# Patient Record
Sex: Female | Born: 1965
Health system: Southern US, Community
[De-identification: ages and names within clinical notes are randomized; demographics above are authoritative.]

## PROBLEM LIST (undated history)

## (undated) DIAGNOSIS — T884XXA Failed or difficult intubation, initial encounter: Secondary | ICD-10-CM

## (undated) DIAGNOSIS — G709 Myoneural disorder, unspecified: Secondary | ICD-10-CM

## (undated) DIAGNOSIS — E079 Disorder of thyroid, unspecified: Secondary | ICD-10-CM

## (undated) DIAGNOSIS — E039 Hypothyroidism, unspecified: Secondary | ICD-10-CM

## (undated) DIAGNOSIS — E119 Type 2 diabetes mellitus without complications: Secondary | ICD-10-CM

## (undated) DIAGNOSIS — Z8489 Family history of other specified conditions: Secondary | ICD-10-CM

## (undated) DIAGNOSIS — D251 Intramural leiomyoma of uterus: Secondary | ICD-10-CM

## (undated) DIAGNOSIS — I509 Heart failure, unspecified: Secondary | ICD-10-CM

## (undated) DIAGNOSIS — I119 Hypertensive heart disease without heart failure: Secondary | ICD-10-CM

## (undated) DIAGNOSIS — G4733 Obstructive sleep apnea (adult) (pediatric): Secondary | ICD-10-CM

## (undated) DIAGNOSIS — I10 Essential (primary) hypertension: Secondary | ICD-10-CM

## (undated) DIAGNOSIS — G4730 Sleep apnea, unspecified: Secondary | ICD-10-CM

## (undated) HISTORY — DX: Obstructive sleep apnea (adult) (pediatric): G47.33

## (undated) HISTORY — DX: Disorder of thyroid, unspecified: E07.9

## (undated) HISTORY — PX: ABLATION: SHX5711

## (undated) HISTORY — PX: TRANSTHORACIC ECHOCARDIOGRAM: SHX275

## (undated) HISTORY — DX: Myoneural disorder, unspecified: G70.9

## (undated) HISTORY — PX: TUBAL LIGATION: SHX77

## (undated) HISTORY — PX: DIAGNOSTIC LAPAROSCOPY: SUR761

## (undated) HISTORY — DX: Sleep apnea, unspecified: G47.30

## (undated) HISTORY — DX: Hypertensive heart disease without heart failure: I11.9

## (undated) HISTORY — PX: COLONOSCOPY: SHX174

## (undated) HISTORY — DX: Intramural leiomyoma of uterus: D25.1

## (undated) HISTORY — PX: THYROIDECTOMY: SHX17

## (undated) HISTORY — PX: DILATION AND CURETTAGE OF UTERUS: SHX78

---

## 2011-04-28 DIAGNOSIS — I420 Dilated cardiomyopathy: Secondary | ICD-10-CM

## 2011-04-28 DIAGNOSIS — Z87891 Personal history of nicotine dependence: Secondary | ICD-10-CM

## 2011-04-28 HISTORY — DX: Dilated cardiomyopathy: I42.0

## 2011-04-28 HISTORY — DX: Personal history of nicotine dependence: Z87.891

## 2011-05-19 HISTORY — PX: LEFT HEART CATH AND CORONARY ANGIOGRAPHY: CATH118249

## 2011-07-18 HISTORY — PX: TRANSTHORACIC ECHOCARDIOGRAM: SHX275

## 2015-12-23 ENCOUNTER — Encounter: Payer: Self-pay | Admitting: Gastroenterology

## 2017-01-21 DIAGNOSIS — F17200 Nicotine dependence, unspecified, uncomplicated: Secondary | ICD-10-CM | POA: Insufficient documentation

## 2017-01-21 DIAGNOSIS — I509 Heart failure, unspecified: Secondary | ICD-10-CM | POA: Insufficient documentation

## 2017-01-21 DIAGNOSIS — E890 Postprocedural hypothyroidism: Secondary | ICD-10-CM | POA: Insufficient documentation

## 2017-01-21 DIAGNOSIS — I110 Hypertensive heart disease with heart failure: Secondary | ICD-10-CM | POA: Insufficient documentation

## 2017-01-21 DIAGNOSIS — Z87891 Personal history of nicotine dependence: Secondary | ICD-10-CM | POA: Insufficient documentation

## 2017-01-21 DIAGNOSIS — Z794 Long term (current) use of insulin: Secondary | ICD-10-CM | POA: Insufficient documentation

## 2017-01-21 DIAGNOSIS — E119 Type 2 diabetes mellitus without complications: Secondary | ICD-10-CM | POA: Insufficient documentation

## 2017-12-04 ENCOUNTER — Emergency Department (HOSPITAL_BASED_OUTPATIENT_CLINIC_OR_DEPARTMENT_OTHER)
Admission: EM | Admit: 2017-12-04 | Discharge: 2017-12-04 | Disposition: A | Discharge status: Home / Self Care | Payer: Self-pay | Attending: Emergency Medicine | Admitting: Emergency Medicine

## 2017-12-04 ENCOUNTER — Emergency Department (HOSPITAL_BASED_OUTPATIENT_CLINIC_OR_DEPARTMENT_OTHER): Payer: Self-pay

## 2017-12-04 ENCOUNTER — Other Ambulatory Visit: Payer: Self-pay

## 2017-12-04 ENCOUNTER — Encounter (HOSPITAL_BASED_OUTPATIENT_CLINIC_OR_DEPARTMENT_OTHER): Payer: Self-pay | Admitting: Emergency Medicine

## 2017-12-04 DIAGNOSIS — Z7984 Long term (current) use of oral hypoglycemic drugs: Secondary | ICD-10-CM | POA: Insufficient documentation

## 2017-12-04 DIAGNOSIS — E119 Type 2 diabetes mellitus without complications: Secondary | ICD-10-CM | POA: Insufficient documentation

## 2017-12-04 DIAGNOSIS — I509 Heart failure, unspecified: Secondary | ICD-10-CM | POA: Insufficient documentation

## 2017-12-04 DIAGNOSIS — A599 Trichomoniasis, unspecified: Secondary | ICD-10-CM | POA: Insufficient documentation

## 2017-12-04 DIAGNOSIS — Z79899 Other long term (current) drug therapy: Secondary | ICD-10-CM | POA: Insufficient documentation

## 2017-12-04 DIAGNOSIS — I110 Hypertensive heart disease with heart failure: Secondary | ICD-10-CM | POA: Insufficient documentation

## 2017-12-04 DIAGNOSIS — F17200 Nicotine dependence, unspecified, uncomplicated: Secondary | ICD-10-CM | POA: Insufficient documentation

## 2017-12-04 DIAGNOSIS — D219 Benign neoplasm of connective and other soft tissue, unspecified: Secondary | ICD-10-CM

## 2017-12-04 DIAGNOSIS — D259 Leiomyoma of uterus, unspecified: Secondary | ICD-10-CM | POA: Insufficient documentation

## 2017-12-04 HISTORY — DX: Essential (primary) hypertension: I10

## 2017-12-04 HISTORY — DX: Heart failure, unspecified: I50.9

## 2017-12-04 HISTORY — DX: Type 2 diabetes mellitus without complications: E11.9

## 2017-12-04 LAB — LIPASE, BLOOD: LIPASE: 32 U/L (ref 11–51)

## 2017-12-04 LAB — BASIC METABOLIC PANEL
Anion gap: 9 (ref 5–15)
BUN: 14 mg/dL (ref 6–20)
CALCIUM: 9.1 mg/dL (ref 8.9–10.3)
CO2: 27 mmol/L (ref 22–32)
CREATININE: 1.01 mg/dL — AB (ref 0.44–1.00)
Chloride: 100 mmol/L (ref 98–111)
GFR calc non Af Amer: >60 mL/min (ref >60)
GLUCOSE: 209 mg/dL — AB (ref 70–99)
Potassium: 4.4 mmol/L (ref 3.5–5.1)
Sodium: 136 mmol/L (ref 135–145)

## 2017-12-04 LAB — WET PREP, GENITAL
SPERM: NONE SEEN
YEAST WET PREP: NONE SEEN

## 2017-12-04 LAB — URINALYSIS, ROUTINE W REFLEX MICROSCOPIC
BILIRUBIN URINE: NEGATIVE
Glucose, UA: NEGATIVE mg/dL
Hgb urine dipstick: NEGATIVE
KETONES UR: NEGATIVE mg/dL
NITRITE: NEGATIVE
PROTEIN: NEGATIVE mg/dL
Specific Gravity, Urine: <1.005 — ABNORMAL LOW (ref 1.005–1.030)
pH: 7 (ref 5.0–8.0)

## 2017-12-04 LAB — CBC
HCT: 42 % (ref 36.0–46.0)
Hemoglobin: 14.2 g/dL (ref 12.0–15.0)
MCH: 28.6 pg (ref 26.0–34.0)
MCHC: 33.8 g/dL (ref 30.0–36.0)
MCV: 84.7 fL (ref 78.0–100.0)
PLATELETS: 237 10*3/uL (ref 150–400)
RBC: 4.96 MIL/uL (ref 3.87–5.11)
RDW: 13.8 % (ref 11.5–15.5)
WBC: 6.6 10*3/uL (ref 4.0–10.5)

## 2017-12-04 LAB — HEPATIC FUNCTION PANEL
ALT: 15 U/L (ref 0–44)
AST: 18 U/L (ref 15–41)
Albumin: 4.2 g/dL (ref 3.5–5.0)
Alkaline Phosphatase: 69 U/L (ref 38–126)
BILIRUBIN DIRECT: 0.1 mg/dL (ref 0.0–0.2)
Indirect Bilirubin: 0.6 mg/dL (ref 0.3–0.9)
TOTAL PROTEIN: 8 g/dL (ref 6.5–8.1)
Total Bilirubin: 0.7 mg/dL (ref 0.3–1.2)

## 2017-12-04 LAB — TROPONIN I

## 2017-12-04 LAB — URINALYSIS, MICROSCOPIC (REFLEX)

## 2017-12-04 MED ORDER — IOPAMIDOL (ISOVUE-300) INJECTION 61%
100.0000 mL | Freq: Once | INTRAVENOUS | Status: AC | PRN
  Administered 2017-12-04: 100 mL via INTRAVENOUS

## 2017-12-04 MED ORDER — METRONIDAZOLE 500 MG PO TABS: 500.0000 mg | ORAL_TABLET | Freq: Two times a day (BID) | ORAL | 0 refills | Status: AC

## 2017-12-04 MED ORDER — IOPAMIDOL (ISOVUE-300) INJECTION 61%: 100.0000 mL | Freq: Once | INTRAVENOUS | Status: DC | PRN

## 2017-12-04 MED ORDER — HYDROCODONE-ACETAMINOPHEN 5-325 MG PO TABS: 1.0000 | ORAL_TABLET | Freq: Three times a day (TID) | ORAL | 0 refills | Status: DC | PRN

## 2017-12-04 MED ORDER — ACETAMINOPHEN 500 MG PO TABS
1000.0000 mg | ORAL_TABLET | Freq: Once | ORAL | Status: AC
  Administered 2017-12-04: 1000 mg via ORAL
  Filled 2017-12-04: qty 2

## 2017-12-04 MED ORDER — IOPAMIDOL (ISOVUE-M 300) INJECTION 61%: 15.0000 mL | Freq: Once | INTRAMUSCULAR | Status: DC | PRN

## 2017-12-04 NOTE — ED Notes (Signed)
Patient transported to X-ray 

## 2017-12-04 NOTE — ED Provider Notes (Signed)
[facility] EMERGENCY DEPARTMENT Provider Note   CSN: [id number] Arrival date & time: 12/04/17  1321     History   Chief Complaint Chief Complaint  Patient presents with  . Chest Pain  . Abdominal Pain    HPI Suzanne Cline is a 52 y.o. female.  HPI   Patient is a 52 year old female with history of CHF, T2DM, hypertension, hyperlipidemia, fibroids who presents the emergency department today to be evaluated for chest pain and abdominal pain.  With regard to chest pain, patient states that for the last several years she has had intermittent episodes of left-sided chest/axillary tightness/pain that she rates at 7/10.  The episodes last 5 minutes at a time and "feel like a muscle strain ".  She denies any particular exacerbating or alleviating factors and states that symptoms are not associated with exertion.  Not associated with diaphoresis, nausea, vomiting, lightheadedness or diaphoresis.  States that she has these episodes about 1-2 times per month.  Most recent episode occurred yesterday.  The episode lasted for a few minutes and resolved on its own.  She denies any recurrence of the episodes and has no chest pain right now.  With regard to abdominal pain, she states that she has had left-sided abdominal pain for several weeks that seems to have worsened over the last week.  Pain is located to the left upper and left lower quadrant.  Rates pain "11/10 ".  States that it is intermittent and has been becoming more frequent recently.  Intermittently radiates to her back.  Reports that it feels like "fire in my stomach " and feels like pins-and-needles.  Denies rashes.  She reports associated nausea and diarrhea but denies any vomiting, constipation, bloody stools.  Endorses some urinary frequency but denies dysuria, hematuria, urgency.  Denies any abnormal vaginal discharge.  States that she has not been sexually active in 1 year.  Denies any fevers.  Past Medical History:    Diagnosis Date  . CHF (congestive heart failure) ([place])   . Diabetes mellitus without complication ([place])   . Hypertension     There are no active problems to display for this patient.   Past Surgical History:  Procedure Laterality Date  . ABLATION     uterus  . THYROIDECTOMY       OB History   None      Home Medications    Prior to Admission medications   Medication Sig Start Date End Date Taking? Authorizing Provider  carvedilol (COREG) 25 MG tablet Take 25 mg by mouth 2 (two) times daily with a meal.   Yes [provider]  furosemide (LASIX) 80 MG tablet Take 80 mg by mouth 2 (two) times daily.   Yes [provider]  levothyroxine (SYNTHROID) 125 MCG tablet Take 125 mcg by mouth daily before breakfast.   Yes [provider]  losartan (COZAAR) 25 MG tablet Take 25 mg by mouth daily.   Yes [provider]  metFORMIN (GLUCOPHAGE) 1000 MG tablet Take 1,000 mg by mouth 2 (two) times daily with a meal.   Yes [provider]  spironolactone (ALDACTONE) 25 MG tablet Take 25 mg by mouth daily.   Yes [provider]  HYDROcodone-acetaminophen (NORCO/VICODIN) 5-325 MG tablet Take 1 tablet by mouth every 8 (eight) hours as needed. 12/04/17   Amahri Dengel S, PA-C  metroNIDAZOLE (FLAGYL) 500 MG tablet Take 1 tablet (500 mg total) by mouth 2 (two) times daily for 7 days. 12/04/17 12/11/17  Asianae Minkler S, PA-C    Family History No family history on file.  Social History Social History   Tobacco Use  . Smoking status: Current Every Day Smoker    Packs/day: 0.50  . Smokeless tobacco: Never Used  Substance Use Topics  . Alcohol use: Yes    Comment: social  . Drug use: Never     Allergies   Oxycodone   Review of Systems Review of Systems  Constitutional: Negative for chills and fever.  HENT: Negative for congestion.   Eyes: Negative for visual disturbance.  Respiratory: Negative for shortness of breath.    Cardiovascular: Negative for chest pain and leg swelling.  Gastrointestinal: Positive for abdominal pain, diarrhea and nausea. Negative for blood in stool, constipation and vomiting.  Genitourinary: Positive for flank pain, frequency and pelvic pain. Negative for dysuria, hematuria, urgency and vaginal discharge.  Musculoskeletal: Negative for back pain.  Skin: Negative for color change.  Neurological: Negative for headaches.   Physical Exam Updated Vital Signs BP (!) 169/107 (BP Location: Right Arm)   Pulse 63   Temp 98.6 F (37 C)   Resp 16   Ht 5' 3.5" (1.613 m)   Wt 79.8 kg   SpO2 100%   BMI 30.69 kg/m   Physical Exam  Constitutional: She appears well-developed and well-nourished. She appears distressed (mild).  HENT:  Head: Normocephalic and atraumatic.  Eyes: Conjunctivae are normal.  Neck: Neck supple.  Cardiovascular: Normal rate and regular rhythm.  No murmur heard. Pulmonary/Chest: Effort normal and breath sounds normal. No respiratory distress. She has no decreased breath sounds. She has no wheezes.  Abdominal: Soft. Bowel sounds are normal.  TTP to the LUE, LLQ and periumbilical area with firmness to the abdomen. BS present in all 4 quadrants. No rebound TTP. Guarding present. Left CVA TTP.  Genitourinary:  Genitourinary Comments: exam chaperoned Pelvic exam: normal external genitalia without evidence of trauma. VULVA: normal appearing vulva with no masses, tenderness or lesion. VAGINA: normal appearing vagina with normal color and discharge, no lesions. CERVIX: normal appearing cervix without lesions, cervical motion tenderness absent, cervical os closed, purulent yellow discharge noted; Wet prep and DNA probe for chlamydia and GC obtained.   ADNEXA: Mild tenderness to the left adnexa.  No right adnexal tenderness. UTERUS: Uterus is enlarged, firm and exquisitely tender to palpation, more so on the left.  Musculoskeletal: She exhibits no edema.  Neurological:  She is alert.  Skin: Skin is warm and dry. Capillary refill takes less than 2 seconds.  Psychiatric: She has a normal mood and affect.  Nursing note and vitals reviewed.   ED Treatments / Results  Labs (all labs ordered are listed, but only abnormal results are displayed) Labs Reviewed  WET PREP, GENITAL - Abnormal; Notable for the following components:      Result Value   Trich, Wet Prep PRESENT (*)    Clue Cells Wet Prep HPF POC PRESENT (*)    WBC, Wet Prep HPF POC FEW (*)    All other components within normal limits  BASIC METABOLIC PANEL - Abnormal; Notable for the following components:   Glucose, Bld 209 (*)    Creatinine, Ser 1.01 (*)    All other components within normal limits  URINALYSIS, ROUTINE W REFLEX MICROSCOPIC - Abnormal; Notable for the following components:   Specific Gravity, Urine <1.005 (*)    Leukocytes, UA SMALL (*)    All other components within normal limits  URINALYSIS, MICROSCOPIC (REFLEX) - Abnormal; Notable for  the following components:   Bacteria, UA FEW (*)    Trichomonas, UA PRESENT (*)    All other components within normal limits  CBC  TROPONIN I  HEPATIC FUNCTION PANEL  LIPASE, BLOOD  GC/CHLAMYDIA PROBE AMP () NOT AT [facility]    EKG EKG Interpretation  Date/Time:  Saturday December 04 2017 13:35:27 EDT Ventricular Rate:  81 PR Interval:    QRS Duration: 99 QT Interval:  418 QTC Calculation: 486 R Axis:   -43 Text Interpretation:  Sinus rhythm Probable left atrial enlargement Left anterior fascicular block Low voltage, precordial leads Borderline prolonged QT interval Confirmed by Fredia Sorrow [phone]) on 12/04/2017 2:18:38 PM   Radiology Dg Chest 2 View  Result Date: 12/04/2017 CLINICAL DATA:  Chest pain and shortness of breath for 2 weeks. EXAM: CHEST - 2 VIEW COMPARISON:  None. FINDINGS: The heart size and mediastinal contours are within normal limits. There is no focal infiltrate, pulmonary edema, or pleural effusion.  Degenerative joint changes of the spine are identified. IMPRESSION: No active cardiopulmonary disease. Electronically Signed   By: Abelardo Diesel M.D.   On: 12/04/2017 14:47   Ct Abdomen Pelvis W Contrast  Result Date: 12/04/2017 CLINICAL DATA:  Abdominal pain, assess for diverticulitis. EXAM: CT ABDOMEN AND PELVIS WITH CONTRAST TECHNIQUE: Multidetector CT imaging of the abdomen and pelvis was performed using the standard protocol following bolus administration of intravenous contrast. CONTRAST:  129mL ISOVUE-300 IOPAMIDOL (ISOVUE-300) INJECTION 61% COMPARISON:  None. FINDINGS: Lower chest: No acute abnormality. Hepatobiliary: There is diffuse low density of liver without vessel displacement. No focal liver lesion is identified. The gallbladder is normal. Biliary tree is normal. Pancreas: Unremarkable. No pancreatic ductal dilatation or surrounding inflammatory changes. Spleen: Normal in size without focal abnormality. Adrenals/Urinary Tract: Adrenal glands are unremarkable. Kidneys are normal, without renal calculi, focal lesion, or hydronephrosis. Bladder is unremarkable. Stomach/Bowel: Stomach is within normal limits. The appendix is not seen. No inflammation is noted around cecum. No evidence of bowel wall thickening, distention, or inflammatory changes. Vascular/Lymphatic: Aortic atherosclerosis. No enlarged abdominal or pelvic lymph nodes. Reproductive: There is a large rim calcified mass probably arising from the uterus measuring 11.7 x 9.3 x 10.6 cm. Although this may represent a large partial calcified uterine fibroid, malignant etiology is not excluded based on CT. The bilateral adnexa are normal. Other: None. Musculoskeletal: Degenerative joint changes of the spine are noted. IMPRESSION: No evidence of bowel obstruction or diverticulitis. 11.7 cm rim calcified mass probably arising from the uterus. Although this may represent a large partial calcified uterine fibroid, malignant etiology is not excluded  based on CT. Electronically Signed   By: Abelardo Diesel M.D.   On: 12/04/2017 14:54    Procedures Procedures (including critical care time)  Medications Ordered in ED Medications  acetaminophen (TYLENOL) tablet 1,000 mg (1,000 mg Oral Given 12/04/17 1418)  iopamidol (ISOVUE-300) 61 % injection 100 mL (100 mLs Intravenous Contrast Given 12/04/17 1431)     Initial Impression / Assessment and Plan / ED Course  I have reviewed the triage vital signs and the nursing notes.  Pertinent labs & imaging results that were available during my care of the patient were reviewed by me and considered in my medical decision making (see chart for details).      Final Clinical Impressions(s) / ED Diagnoses   Final diagnoses:  Fibroids  Trichomoniasis   Patient presenting with left-sided abdominal pain that has been ongoing for months but seem to have worsened 1 week ago.  Recently had pelvic ultrasound showing uterine fibroids earlier this week.  Cbc, BMP, hepatic function panel, liapse are all WNL. UA shows a sterile pyuria and trichomonas.   Given presence of trichomonas on UA, discussed pelvic exam and STD testing with patient to r/o other infections and r/o PID.   Alec exam completed with uterine tenderness, firmness and enlargement.  Mild left adnexal tenderness.  Do not feel that repeat ultrasound necessary given she has had one 3 days ago.  Low suspicion for torsion.  Suspect that her pain is secondary to uterine fibroid.  CT of the abdomen showed a calcified mass that was 11.7 cm arising from the uterus.  Likely partially calcified uterine fibroid however malignant any pathology is not excluded.  These results were communicated to the patient and she was advised to follow-up with her gynecologist in regards to the imaging today.  We will give pain medications and antibiotics to treat her trichomonas.  She also had complaints of intermittent chest pain.  Last episode of chest pain occurred  yesterday.  No chest pain or shortness of breath today.  Troponin negative today.  ECG with normal sinus rhythm, no ischemic changes.  Chest x-ray negative.  Very low suspicion for ACS this sounds atypical for that.  Low suspicion for PE, AAA or other acute pulmonary or cardiac etiology at this time.  Advised to follow-up with her PCP in regards to this and to return if she has any new or worsening symptoms.  ED Discharge Orders         Ordered    metroNIDAZOLE (FLAGYL) 500 MG tablet  2 times daily     12/04/17 1640    HYDROcodone-acetaminophen (NORCO/VICODIN) 5-325 MG tablet  Every 8 hours PRN     12/04/17 1640           Zaria Taha S, PA-C 12/06/17 0044    Fredia Sorrow, MD 12/07/17 [phone]

## 2017-12-04 NOTE — ED Triage Notes (Signed)
For last 2 weeks has been having chest and abd pain . Also SOB, had a pelvis U/S done on Tues. For fibroids.

## 2017-12-04 NOTE — Discharge Instructions (Addendum)
You were given a prescription for antibiotics. Please take the antibiotic prescription fully.   Prescription given for Hydrocodone. Take medication as directed and do not operate machinery, drive a car, or work while taking this medication as it can make you drowsy.   Follow-up with your gynecologist to make an appointment for follow-up in regards to your uterine fibroids.  Please follow up with your primary doctor within the next 5-7 days.  Please return to the ER sooner if you have any new or worsening symptoms, or if you have any of the following symptoms:  Abdominal pain that does not go away.  You have a fever.  You keep throwing up (vomiting).  The pain is felt only in portions of the abdomen. Pain in the right side could possibly be appendicitis. In an adult, pain in the left lower portion of the abdomen could be colitis or diverticulitis.  You pass bloody or black tarry stools.  There is bright red blood in the stool.  The constipation stays for more than 4 days.  There is belly (abdominal) or rectal pain.  You do not seem to be getting better.  You have any questions or concerns.

## 2017-12-04 NOTE — ED Notes (Signed)
Pt reports that she does not have a ride home, PA made aware.

## 2017-12-06 LAB — GC/CHLAMYDIA PROBE AMP (~~LOC~~) NOT AT ARMC
Chlamydia: NEGATIVE
Neisseria Gonorrhea: NEGATIVE

## 2018-03-02 DIAGNOSIS — I10 Essential (primary) hypertension: Secondary | ICD-10-CM | POA: Insufficient documentation

## 2019-01-23 ENCOUNTER — Encounter (HOSPITAL_COMMUNITY): Payer: Self-pay | Admitting: Emergency Medicine

## 2019-01-23 ENCOUNTER — Emergency Department (HOSPITAL_COMMUNITY): Payer: Self-pay

## 2019-01-23 ENCOUNTER — Other Ambulatory Visit: Payer: Self-pay

## 2019-01-23 ENCOUNTER — Emergency Department (HOSPITAL_COMMUNITY)
Admission: EM | Admit: 2019-01-23 | Discharge: 2019-01-23 | Disposition: A | Discharge status: Home / Self Care | Payer: Self-pay | Attending: Emergency Medicine | Admitting: Emergency Medicine

## 2019-01-23 DIAGNOSIS — R109 Unspecified abdominal pain: Secondary | ICD-10-CM | POA: Insufficient documentation

## 2019-01-23 DIAGNOSIS — M25512 Pain in left shoulder: Secondary | ICD-10-CM | POA: Insufficient documentation

## 2019-01-23 DIAGNOSIS — I10 Essential (primary) hypertension: Secondary | ICD-10-CM

## 2019-01-23 DIAGNOSIS — E1165 Type 2 diabetes mellitus with hyperglycemia: Secondary | ICD-10-CM | POA: Insufficient documentation

## 2019-01-23 DIAGNOSIS — Z794 Long term (current) use of insulin: Secondary | ICD-10-CM | POA: Insufficient documentation

## 2019-01-23 DIAGNOSIS — G8929 Other chronic pain: Secondary | ICD-10-CM | POA: Insufficient documentation

## 2019-01-23 DIAGNOSIS — F17210 Nicotine dependence, cigarettes, uncomplicated: Secondary | ICD-10-CM | POA: Insufficient documentation

## 2019-01-23 DIAGNOSIS — I110 Hypertensive heart disease with heart failure: Secondary | ICD-10-CM | POA: Insufficient documentation

## 2019-01-23 DIAGNOSIS — Z79899 Other long term (current) drug therapy: Secondary | ICD-10-CM | POA: Insufficient documentation

## 2019-01-23 DIAGNOSIS — I509 Heart failure, unspecified: Secondary | ICD-10-CM | POA: Insufficient documentation

## 2019-01-23 DIAGNOSIS — R739 Hyperglycemia, unspecified: Secondary | ICD-10-CM

## 2019-01-23 LAB — BASIC METABOLIC PANEL
Anion gap: 12 (ref 5–15)
BUN: 20 mg/dL (ref 6–20)
CO2: 25 mmol/L (ref 22–32)
Calcium: 9.7 mg/dL (ref 8.9–10.3)
Chloride: 97 mmol/L — ABNORMAL LOW (ref 98–111)
Creatinine, Ser: 1.03 mg/dL — ABNORMAL HIGH (ref 0.44–1.00)
GFR calc Af Amer: >60 mL/min (ref >60)
GFR calc non Af Amer: >60 mL/min (ref >60)
Glucose, Bld: 406 mg/dL — ABNORMAL HIGH (ref 70–99)
Potassium: 4.1 mmol/L (ref 3.5–5.1)
Sodium: 134 mmol/L — ABNORMAL LOW (ref 135–145)

## 2019-01-23 LAB — CBC
HCT: 46 % (ref 36.0–46.0)
Hemoglobin: 14.6 g/dL (ref 12.0–15.0)
MCH: 27.9 pg (ref 26.0–34.0)
MCHC: 31.7 g/dL (ref 30.0–36.0)
MCV: 87.8 fL (ref 80.0–100.0)
Platelets: 246 10*3/uL (ref 150–400)
RBC: 5.24 MIL/uL — ABNORMAL HIGH (ref 3.87–5.11)
RDW: 13.7 % (ref 11.5–15.5)
WBC: 7.9 10*3/uL (ref 4.0–10.5)
nRBC: 0 % (ref 0.0–0.2)

## 2019-01-23 LAB — TROPONIN I (HIGH SENSITIVITY)
Troponin I (High Sensitivity): 11 ng/L (ref <18)
Troponin I (High Sensitivity): 9 ng/L (ref <18)

## 2019-01-23 LAB — HEPATIC FUNCTION PANEL
ALT: 12 U/L (ref 0–44)
AST: 14 U/L — ABNORMAL LOW (ref 15–41)
Albumin: 4.2 g/dL (ref 3.5–5.0)
Alkaline Phosphatase: 72 U/L (ref 38–126)
Bilirubin, Direct: 0.1 mg/dL (ref 0.0–0.2)
Indirect Bilirubin: 0.2 mg/dL — ABNORMAL LOW (ref 0.3–0.9)
Total Bilirubin: 0.3 mg/dL (ref 0.3–1.2)
Total Protein: 7.7 g/dL (ref 6.5–8.1)

## 2019-01-23 LAB — LIPASE, BLOOD: Lipase: 42 U/L (ref 11–51)

## 2019-01-23 LAB — GLUCOSE, CAPILLARY: Glucose-Capillary: 311 mg/dL — ABNORMAL HIGH (ref 70–99)

## 2019-01-23 MED ORDER — MORPHINE SULFATE (PF) 4 MG/ML IV SOLN
6.0000 mg | Freq: Once | INTRAVENOUS | Status: AC
  Administered 2019-01-23: 18:00:00 6 mg via INTRAVENOUS
  Filled 2019-01-23: qty 2

## 2019-01-23 MED ORDER — SODIUM CHLORIDE 0.9 % IV BOLUS
500.0000 mL | Freq: Once | INTRAVENOUS | Status: AC
  Administered 2019-01-23: 500 mL via INTRAVENOUS

## 2019-01-23 MED ORDER — INSULIN ASPART 100 UNIT/ML ~~LOC~~ SOLN
10.0000 [IU] | Freq: Once | SUBCUTANEOUS | Status: AC
  Administered 2019-01-23: 18:00:00 10 [IU] via SUBCUTANEOUS
  Filled 2019-01-23: qty 0.1

## 2019-01-23 MED ORDER — SODIUM CHLORIDE 0.9 % IV BOLUS: 1000.0000 mL | Freq: Once | INTRAVENOUS | Status: DC

## 2019-01-23 NOTE — ED Notes (Signed)
Patient ambulatory to restroom with no assistance.

## 2019-01-23 NOTE — ED Provider Notes (Signed)
[facility] DE[facility] Provider Note   CSN: [id number] Arrival date & time: 01/23/19  1118     History   Chief Complaint Chief Complaint  Patient presents with  . Chest Pain    HPI Suzanne Cline is a 53 y.o. female.     The history is provided by the patient. No language interpreter was used.  Chest Pain    53 year old female with history of diabetes, hypertension, CHF, presenting with multiple complaints.  Patient reports for more than a year she has had recurrent pain to her right side abdomen, and left shoulder as well as left armpit and left upper arm.  She described pain as a sharp sensation, episodic, sometimes with burning sensation that has been waxing and waning.  Pain is not brought on by exertion.  Pain has been more intense within the past 2 months.  Pain has been treated with gabapentin in the past with some relief but lately it has not helped.  Pain is not associate with fever, chills, lightheadedness, dizziness, exertional chest pain, shortness of breath, productive cough, rash, nausea vomiting diarrhea, postprandial pain or any recent injury.  She does have history of diabetes and takes metformin but have not take insulin as prescribed.  Her PCP is in [place].  She is a social drinker but denies tobacco use.  She does not have any history of active cancer.  She currently rates her pain is 9 out of 10.  Past Medical History:  Diagnosis Date  . CHF (congestive heart failure) ([place])   . Diabetes mellitus without complication ([place])   . Hypertension     There are no active problems to display for this patient.   Past Surgical History:  Procedure Laterality Date  . ABLATION     uterus  . THYROIDECTOMY       OB History   No obstetric history on file.      Home Medications    Prior to Admission medications   Medication Sig Start Date End Date Taking? Authorizing Provider  acyclovir (ZOVIRAX) 400 MG tablet Take 400 mg by mouth 3  (three) times daily. 11/14/18  Yes [provider]  carvedilol (COREG) 25 MG tablet Take 25 mg by mouth 2 (two) times daily with a meal.   Yes [provider]  furosemide (LASIX) 40 MG tablet Take 40 mg by mouth 2 (two) times daily.   Yes [provider]  gabapentin (NEURONTIN) 300 MG capsule Take 300 mg by mouth 2 (two) times daily as needed (pain).  01/12/19  Yes [provider]  glipiZIDE (GLUCOTROL) 10 MG tablet Take 10 mg by mouth 2 (two) times daily before a meal. 09/09/18  Yes [provider]  Insulin Glargine (BASAGLAR KWIKPEN) 100 UNIT/ML SOPN Inject 25 Units into the skin at bedtime.  09/09/18  Yes [provider]  losartan (COZAAR) 25 MG tablet Take 25 mg by mouth daily.   Yes [provider]  metFORMIN (GLUCOPHAGE) 1000 MG tablet Take 1,000 mg by mouth 2 (two) times daily with a meal.   Yes [provider]  spironolactone (ALDACTONE) 25 MG tablet Take 25 mg by mouth daily.   Yes [provider]  SYNTHROID 150 MCG tablet Take 150 mcg by mouth daily. 09/09/18  Yes [provider]  HYDROcodone-acetaminophen (NORCO/VICODIN) 5-325 MG tablet Take 1 tablet by mouth every 8 (eight) hours as needed. Patient not taking: Reported on 01/23/2019 12/04/17   Couture, [employer], PA-C  Family History No family history on file.  Social History Social History   Tobacco Use  . Smoking status: Current Every Day Smoker    Packs/day: 0.50  . Smokeless tobacco: Never Used  Substance Use Topics  . Alcohol use: Yes    Comment: social  . Drug use: Never     Allergies   Oxycodone   Review of Systems Review of Systems  Cardiovascular: Positive for chest pain.  All other systems reviewed and are negative.    Physical Exam Updated Vital Signs BP (!) 190/114 (BP Location: Right Arm)   Pulse 73   Temp 98.8 F (37.1 C) (Oral)   Resp (!) 24   SpO2 98%   Physical Exam Vitals signs and nursing note  reviewed.  Constitutional:      General: She is not in acute distress.    Appearance: She is well-developed.  HENT:     Head: Atraumatic.  Eyes:     Conjunctiva/sclera: Conjunctivae normal.  Neck:     Musculoskeletal: Normal range of motion and neck supple.     Vascular: No JVD.  Cardiovascular:     Rate and Rhythm: Normal rate and regular rhythm.     Pulses: Normal pulses.     Heart sounds: Normal heart sounds.  Pulmonary:     Effort: Pulmonary effort is normal.     Breath sounds: Normal breath sounds. No wheezing, rhonchi or rales.  Abdominal:     Palpations: Abdomen is soft.     Tenderness: There is abdominal tenderness (Very mild tenderness to right side abdomen without focal point tenderness.  Negative Murphy sign, no pain at McBurney's point.).  Musculoskeletal:        General: Tenderness (Tenderness to left axillary region without any palpable lymphadenopathy or overlying skin changes.) present. No swelling.     Comments: Tenderness to left scapular region without any rash or deformity.  Normal right shoulder range of motion.  Skin:    Capillary Refill: Capillary refill takes less than 2 seconds.     Findings: No rash.  Neurological:     Mental Status: She is alert and oriented to person, place, and time.  Psychiatric:        Mood and Affect: Mood normal.      ED Treatments / Results  Labs (all labs ordered are listed, but only abnormal results are displayed) Labs Reviewed  BASIC METABOLIC PANEL - Abnormal; Notable for the following components:      Result Value   Sodium 134 (*)    Chloride 97 (*)    Glucose, Bld 406 (*)    Creatinine, Ser 1.03 (*)    All other components within normal limits  CBC - Abnormal; Notable for the following components:   RBC 5.24 (*)    All other components within normal limits  HEPATIC FUNCTION PANEL - Abnormal; Notable for the following components:   AST 14 (*)    Indirect Bilirubin 0.2 (*)    All other components within normal  limits  GLUCOSE, CAPILLARY - Abnormal; Notable for the following components:   Glucose-Capillary 311 (*)    All other components within normal limits  LIPASE, BLOOD  CBG MONITORING, ED  TROPONIN I (HIGH SENSITIVITY)  TROPONIN I (HIGH SENSITIVITY)    EKG EKG Interpretation  Date/Time:  Monday January 23 2019 11:47:57 EDT Ventricular Rate:  72 PR Interval:    QRS Duration: 104 QT Interval:  401 QTC Calculation: 439 R Axis:   -53 Text Interpretation:  Sinus rhythm LAE, consider biatrial enlargement Left anterior fascicular block Low voltage, precordial leads Consider anterior infarct No significant change since last tracing Confirmed by Fredia Sorrow [phone]) on 01/23/2019 11:53:10 AM   Radiology Dg Chest 2 View  Result Date: 01/23/2019 CLINICAL DATA:  Chest pain EXAM: CHEST - 2 VIEW COMPARISON:  12/04/2017. FINDINGS: The heart size and mediastinal contours are within normal limits. There are atherosclerotic changes of the thoracic aorta. Both lungs are clear. The visualized skeletal structures are unremarkable. IMPRESSION: No active cardiopulmonary disease. Electronically Signed   By: Constance Holster M.D.   On: 01/23/2019 12:39    Procedures Procedures (including critical care time)  Medications Ordered in ED Medications  insulin aspart (novoLOG) injection 10 Units (10 Units Subcutaneous Given 01/23/19 1801)  morphine 4 MG/ML injection 6 mg (6 mg Intravenous Given 01/23/19 1743)  sodium chloride 0.9 % bolus 500 mL (0 mLs Intravenous Stopped 01/23/19 1923)     Initial Impression / Assessment and Plan / ED Course  I have reviewed the triage vital signs and the nursing notes.  Pertinent labs & imaging results that were available during my care of the patient were reviewed by me and considered in my medical decision making (see chart for details).        BP (!) 184/101 (BP Location: Right Arm)   Pulse 76   Temp 98.8 F (37.1 C) (Oral)   Resp 20   SpO2 95%     Final Clinical Impressions(s) / ED Diagnoses   Final diagnoses:  Chronic abdominal pain  Chronic left shoulder pain  Hyperglycemia  Essential hypertension    ED Discharge Orders    None     5:19 PM Patient here with recurrent right side abdominal pain as well as left shoulder pain and left axillary pain.  These complaints has been ongoing for more than a year which makes it less likely to be an acute pathology.  She does have some mild tenderness to palpation of left scapular region right side abdomen without any guarding or rebound tenderness.  I have low suspicion for appendicitis or dissection.  No shingles rash.  No recent injury to suggest fracture dislocation of the shoulder.  Patient was found to be hypertensive with a blood pressure of 190/114.  She has not been really compliant with her medication.  Her CBG is 406 with normal anion gap.  We will give IV fluid and insulin.  I will also check lipase as well as hepatic function panel.  I have low suspicion for ACS causing her symptom.  8:58 PM Negative delta troponin, low suspicion for ACS.  Hepatic function panel and lipase are normal.  CBG did improve after patient receiving IV fluid as well as insulin.  Given her chronic problem, encourage patient to follow-up closely with her PCP for further management of her diabetes as well as her high blood pressure.  She needs to be compliant with her medication.  Return precaution discussed. Encourage pt to take her neurontin for her pain.    Domenic Moras, PA-C 01/23/19 2104    Lucrezia Starch, MD 01/25/19 0100

## 2019-01-23 NOTE — ED Triage Notes (Signed)
Per pt, states she is having left chest pain radiating to left back-states lump under left arm causing left arm numbness, complaining of RLQ pain-history of CHF and DM

## 2019-01-23 NOTE — Discharge Instructions (Signed)
It is important for you to take your insulin as previously prescribed follow-up closely with your primary care doctor for further management of your diabetes.  Uncontrolled diabetes can cause some of the symptoms that you are currently experiencing.  Your blood pressure is elevated today, please have it rechecked.  Return to the ER if you develop worsening symptoms.

## 2019-02-02 ENCOUNTER — Inpatient Hospital Stay: Payer: Self-pay

## 2019-02-16 ENCOUNTER — Encounter: Payer: Self-pay | Admitting: Family Medicine

## 2019-02-16 ENCOUNTER — Other Ambulatory Visit: Payer: Self-pay

## 2019-02-16 ENCOUNTER — Encounter: Payer: Self-pay | Admitting: *Deleted

## 2019-02-16 ENCOUNTER — Ambulatory Visit: Payer: Self-pay | Attending: Family Medicine | Admitting: Family Medicine

## 2019-02-16 VITALS — BP 145/92 | HR 70 | Temp 98.5°F | Resp 18 | Ht 63.5 in | Wt 175.0 lb

## 2019-02-16 DIAGNOSIS — I509 Heart failure, unspecified: Secondary | ICD-10-CM

## 2019-02-16 DIAGNOSIS — R1011 Right upper quadrant pain: Secondary | ICD-10-CM

## 2019-02-16 DIAGNOSIS — Z7689 Persons encountering health services in other specified circumstances: Secondary | ICD-10-CM

## 2019-02-16 DIAGNOSIS — E890 Postprocedural hypothyroidism: Secondary | ICD-10-CM

## 2019-02-16 DIAGNOSIS — E1142 Type 2 diabetes mellitus with diabetic polyneuropathy: Secondary | ICD-10-CM

## 2019-02-16 DIAGNOSIS — Z09 Encounter for follow-up examination after completed treatment for conditions other than malignant neoplasm: Secondary | ICD-10-CM

## 2019-02-16 DIAGNOSIS — R079 Chest pain, unspecified: Secondary | ICD-10-CM

## 2019-02-16 DIAGNOSIS — N644 Mastodynia: Secondary | ICD-10-CM

## 2019-02-16 DIAGNOSIS — E1165 Type 2 diabetes mellitus with hyperglycemia: Secondary | ICD-10-CM

## 2019-02-16 LAB — GLUCOSE, POCT (MANUAL RESULT ENTRY): POC Glucose: 121 mg/dL — AB (ref 70–99)

## 2019-02-16 MED ORDER — GABAPENTIN 300 MG PO CAPS: ORAL_CAPSULE | ORAL | 3 refills | Status: DC

## 2019-02-16 MED FILL — GABAPENTIN 300 MG CAPSULE: 300 | 30 days supply | Qty: 180 | Fill #0

## 2019-02-16 NOTE — Patient Instructions (Signed)
Blood Glucose Monitoring, Adult °Monitoring your blood sugar (glucose) is an important part of managing your diabetes (diabetes mellitus). Blood glucose monitoring involves checking your blood glucose as often as directed and keeping a record (log) of your results over time. °Checking your blood glucose regularly and keeping a blood glucose log can: °· Help you and your health care provider adjust your diabetes management plan as needed, including your medicines or insulin. °· Help you understand how food, exercise, illnesses, and medicines affect your blood glucose. °· Let you know what your blood glucose is at any time. You can quickly find out if you have low blood glucose (hypoglycemia) or high blood glucose (hyperglycemia). °Your health care provider will set individualized treatment goals for you. Your goals will be based on your age, other medical conditions you have, and how you respond to diabetes treatment. Generally, the goal of treatment is to maintain the following blood glucose levels: °· Before meals (preprandial): 80-130 mg/dL (4.4-7.2 mmol/L). °· After meals (postprandial): below 180 mg/dL (10 mmol/L). °· A1c level: less than 7%. °Supplies needed: °· Blood glucose meter. °· Test strips for your meter. Each meter has its own strips. You must use the strips that came with your meter. °· A needle to prick your finger (lancet). Do not use a lancet more than one time. °· A device that holds the lancet (lancing device). °· A journal or log book to write down your results. °How to check your blood glucose ° °1. Wash your hands with soap and water. °2. Prick the side of your finger (not the tip) with the lancet. Use a different finger each time. °3. Gently rub the finger until a small drop of blood appears. °4. Follow instructions that come with your meter for inserting the test strip, applying blood to the strip, and using your blood glucose meter. °5. Write down your result and any notes. °Some meters  allow you to use areas of your body other than your finger (alternative sites) to test your blood. The most common alternative sites are: °· Forearm. °· Thigh. °· Palm of the hand. °If you think you may have hypoglycemia, or if you have a history of not knowing when your blood glucose is getting low (hypoglycemia unawareness), do not use alternative sites. Use your finger instead. Alternative sites may not be as accurate as the fingers, because blood flow is slower in these areas. This means that the result you get may be delayed, and it may be different from the result that you would get from your finger. °Follow these instructions at home: °Blood glucose log ° °· Every time you check your blood glucose, write down your result. Also write down any notes about things that may be affecting your blood glucose, such as your diet and exercise for the day. This information can help you and your health care provider: °? Look for patterns in your blood glucose over time. °? Adjust your diabetes management plan as needed. °· Check if your meter allows you to download your records to a computer. Most glucose meters store a record of glucose readings in the meter. °If you have type 1 diabetes: °· Check your blood glucose 2 or more times a day. °· Also check your blood glucose: °? Before every insulin injection. °? Before and after exercise. °? Before meals. °? 2 hours after a meal. °? Occasionally between 2:00 a.m. and 3:00 a.m., as directed. °? Before potentially dangerous tasks, like driving or using heavy machinery. °?   At bedtime. °· You may need to check your blood glucose more often, up to 6-10 times a day, if you: °? Use an insulin pump. °? Need multiple daily injections (MDI). °? Have diabetes that is not well-controlled. °? Are ill. °? Have a history of severe hypoglycemia. °? Have hypoglycemia unawareness. °If you have type 2 diabetes: °· If you take insulin or other diabetes medicines, check your blood glucose 2 or  more times a day. °· If you are on intensive insulin therapy, check your blood glucose 4 or more times a day. Occasionally, you may also need to check between 2:00 a.m. and 3:00 a.m., as directed. °· Also check your blood glucose: °? Before and after exercise. °? Before potentially dangerous tasks, like driving or using heavy machinery. °· You may need to check your blood glucose more often if: °? Your medicine is being adjusted. °? Your diabetes is not well-controlled. °? You are ill. °General tips °· Always keep your supplies with you. °· If you have questions or need help, all blood glucose meters have a 24-hour "hotline" phone number that you can call. You may also contact your health care provider. °· After you use a few boxes of test strips, adjust (calibrate) your blood glucose meter by following instructions that came with your meter. °Contact a health care provider if: °· Your blood glucose is at or above 240 mg/dL (13.3 mmol/L) for 2 days in a row. °· You have been sick or have had a fever for 2 days or longer, and you are not getting better. °· You have any of the following problems for more than 6 hours: °? You cannot eat or drink. °? You have nausea or vomiting. °? You have diarrhea. °Get help right away if: °· Your blood glucose is lower than 54 mg/dL (3 mmol/L). °· You become confused or you have trouble thinking clearly. °· You have difficulty breathing. °· You have moderate or large ketone levels in your urine. °Summary °· Monitoring your blood sugar (glucose) is an important part of managing your diabetes (diabetes mellitus). °· Blood glucose monitoring involves checking your blood glucose as often as directed and keeping a record (log) of your results over time. °· Your health care provider will set individualized treatment goals for you. Your goals will be based on your age, other medical conditions you have, and how you respond to diabetes treatment. °· Every time you check your blood glucose,  write down your result. Also write down any notes about things that may be affecting your blood glucose, such as your diet and exercise for the day. °This information is not intended to replace advice given to you by your health care provider. Make sure you discuss any questions you have with your health care provider. °Document Released: 04/16/2003 Document Revised: 02/04/2018 Document Reviewed: 09/23/2015 °Elsevier Patient Education © 2020 [employer] ° °

## 2019-02-16 NOTE — Progress Notes (Signed)
Patient complains of a numbing/ burning/ tingling sensation in her left breast/ underarm and back.  Patient complains of right side abdominal pain which began on the left months ago and is now present on the right.  Patient states Baslagar is not controlling her pressure and she would like to start the PASS program for Lantus.  Patient is POST-menopausal based on not having a cycle from 1/19-1/20. Patient shares during the summer of 2020 she had some spotting but denies any the past 3 months.

## 2019-02-16 NOTE — Progress Notes (Signed)
Subjective:  Patient ID: Suzanne Cline, female    DOB: 03-10-1966  Age: 53 y.o. MRN: [id number]  CC: Hospitalization Follow-up   HPI Suzanne Cline, 53 year old female new to the practice who presents for hospital follow-up.  Patient is status post emergency department visit on 01/23/2019 with complaint per ED notes of more than 1 year recurrent pain to her right abdomen, left shoulder, left armpit and left upper arm.  She reported medical history of diabetes, hypertension and CHF.  Blood pressure in the emergency department was elevated at 190/114.  Glucose was elevated at 406.  Patient was given IV fluids and insulin with some improvement in her symptoms.  She was encouraged to follow-up with her primary care physician in [place] or to come to this office for further follow-up.        Patient reports that she feels better than she did at her emergency department visit however she continues to have pain in her left upper chest/breast and armpit as well as pain in the left upper back.  She also feels as if she has some numbness on the backside of her left upper arm.  She reports that she is also having pain in her right upper to mid abdomen.  She states that she had similar pain in her left chest and in the right abdomen about a year ago but the pain resolved.  She reports that the pain has now restarted over the past 3 months and is pretty much constant.  The pain is sometimes at a lower level about a 3-4 but then she will have sudden, sharp/burning pain in the left chest wall/breast which seems to radiate from her armpit area.  This pain can range from a 6-8 on a 0-to-10 scale.  She also has similar sharp and burning pain in her left upper back.  Pain in her right abdomen is sometimes slightly throbbing in nature and it of the time sharp.  She reports that she has had a colonoscopy a few years ago without any abnormalities.  She reports no current issues with nausea/vomiting or diarrhea.  She has issues  with chronic constipation but states that she has increased her fiber and water and now has a bowel movement at least once daily.          She reports that her major concern is that her left-sided breast pain and left axillary pain as well as fullness might be cancer.  She is not sure when she last had a mammogram.  She denies any skin changes or nipple discharge.  She is postmenopausal.  When she has the left chest pain and left upper back pain, she denies any associated nausea, sweating or radiation of discomfort/pain into the neck or jaw.  She does tend to have pain in her left arm and pain/numbness in the back of her left upper arm and upper back and this can occur with or without chest pain.  She reports that she does have congestive heart failure which she feels is currently controlled as she has had no shortness of breath or peripheral edema.  She does not recall if she has had a cardiology work-up regarding heart disease.          She reports that she has numbness and tingling mostly in the right foot.  She states that her former PCP also placed her on gabapentin to help with the burning and stinging sensation in her left chest and upper arm.  Patient is currently  on 300 mg 2-3 times daily but feels that she needs to have an increase in the dose as her current dose is not controlling her pain.          Patient reports that she was on Lantus in the past and she felt that this controlled her blood sugars but she was placed on Basaglar by her prior primary care physician.  Patient reports that her home blood sugars are generally in the 130s to 140s fasting but she believes that her blood sugars would be better controlled with the use of Lantus.  She cannot recall nonfasting blood sugars and when asked about monitoring of her blood sugars she admits that she does not do this on a regular basis as she does not like sticking her own finger.  She denies any increased thirst or urinary frequency at this time.   She also reports prior complete thyroidectomy due to an enlarged thyroid which was affecting her breathing.  She denies any cancers thyroid nodules.  She has had some fatigue but no peripheral edema.  No palpitations.  No heat or cold intolerance.  She does not believe that she has had a recent TSH to make sure that she is on the correct dose of thyroid replacement medicine.  She reports that she has all of her current medicines and has refills and is able to obtain her medications through a [facility] program.  Past Medical History:  Diagnosis Date  . CHF (congestive heart failure) ([place])   . Diabetes mellitus without complication ([place])   . Hypertension     Past Surgical History:  Procedure Laterality Date  . ABLATION     uterus  . THYROIDECTOMY      Family History  Problem Relation Age of Onset  . Diabetes Mother   . Breast cancer Neg Hx     Social History   Tobacco Use  . Smoking status: Current Every Day Smoker    Packs/day: 0.50  . Smokeless tobacco: Never Used  Substance Use Topics  . Alcohol use: Yes    Comment: social    ROS Review of Systems  Constitutional: Positive for fatigue. Negative for chills, diaphoresis and fever.  HENT: Negative for sore throat and trouble swallowing.   Eyes: Negative for photophobia and visual disturbance.       Wears reading glasses  Respiratory: Negative for cough and shortness of breath.   Cardiovascular: Positive for chest pain. Negative for palpitations and leg swelling.  Gastrointestinal: Positive for abdominal pain and constipation. Negative for blood in stool.  Endocrine: Negative for cold intolerance, heat intolerance, polydipsia, polyphagia and polyuria.  Genitourinary: Negative for dysuria, flank pain and frequency.  Musculoskeletal: Positive for arthralgias and back pain. Negative for gait problem and joint swelling.  Neurological: Negative for dizziness and headaches.  Hematological: Negative for adenopathy. Does not  bruise/bleed easily.  Psychiatric/Behavioral: Negative for self-injury and suicidal ideas. The patient is nervous/anxious (reports worry that her pain may indicate that she has cancer).     Objective:   Today's Vitals: BP (!) 145/92 (BP Location: Right Arm, Patient Position: Sitting, Cuff Size: Normal)   Pulse 70   Temp 98.5 F (36.9 C) (Oral)   Resp 18   Ht 5' 3.5" (1.613 m)   Wt 175 lb (79.4 kg)   SpO2 99%   BMI 30.51 kg/m   Physical Exam Constitutional:      General: She is not in acute distress.    Appearance: Normal appearance. She is  not ill-appearing.     Comments: WNWD older female  sitting on exam table in exam gown and wearing mask as per office [id number] policy. Appears anxious and possibly fatigued but in NAD  Eyes:     Extraocular Movements: Extraocular movements intact.     Conjunctiva/sclera: Conjunctivae normal.     Comments: Eyes are slightly big/prominent suggestive of exopthalmus  Cardiovascular:     Rate and Rhythm: Normal rate and regular rhythm.     Pulses:          Dorsalis pedis pulses are 1+ on the right side and 1+ on the left side.       Posterior tibial pulses are 1+ on the right side and 1+ on the left side.  Pulmonary:     Effort: Pulmonary effort is normal.     Breath sounds: Normal breath sounds. No wheezing or rhonchi.  Abdominal:     Palpations: Abdomen is soft.     Tenderness: There is abdominal tenderness (mild tenderness in the right upper and mid abdomen to palpation). There is guarding (mild voluntary guarding). There is no right CVA tenderness, left CVA tenderness or rebound.  Musculoskeletal:        General: Tenderness (patient has area of tenderness to palp at the left upper lateral border of the scapula and left posteriolateral shoulder and down the underside of the left upper arm) and deformity (mild hammertoe deformities bilaterally) present. No swelling.     Right lower leg: No edema.     Left lower leg: No edema.  Feet:     Right  foot:     Skin integrity: Dry skin present. No ulcer, blister, skin breakdown, erythema, warmth, callus or fissure.     Toenail Condition: Right toenails are normal.     Left foot:     Skin integrity: Dry skin present. No ulcer, blister, skin breakdown, erythema, warmth, callus or fissure.     Toenail Condition: Left toenails are normal.  Skin:    General: Skin is warm and dry.  Neurological:     General: No focal deficit present.     Mental Status: She is alert and oriented to person, place, and time.  Psychiatric:        Behavior: Behavior normal.     Comments: Appears anxious     Assessment & Plan:  1. Uncontrolled type 2 diabetes mellitus with hyperglycemia ([place]); Encounter to establish care; Encounter for examination following treatment at hospital Notes and labs reviewed from patient's emergency department visit on 01/23/2019 and these were discussed with the patient.  She will have repeat glucose at today's visit as well as BMP and hemoglobin A1c done which showed poorly controlled diabetes with A1c of 10.4.  Patient is interested in switching from [place] which she currently takes back to Lantus which she took in the past and felt that this medicine controlled her blood sugars better.  She is asked to bring her glucometer and blood sugar diary to her follow-up appointment in 2 weeks to discuss changes in medications.  Patient reported that her fasting blood sugars are 130-140 and that her sugars were well controlled but this does not appear to be the case due to her A1c of 10.4.  She is asked to remain compliant with her current medications and a low carbohydrate diet. - Glucose (CBG) - Hemoglobin [id number] - Basic Metabolic Panel - Ambulatory referral to Cardiology  2. Chronic congestive heart failure, unspecified heart failure type ([place]) Patient's congestive heart  failure appears to be stable at this time.  She reports no shortness of breath or peripheral edema.  She will be referred to  cardiology for further evaluation as she is also having issues with left-sided chest pain and left arm numbness which could indicate cardiac cause of her symptoms.  Due to her diabetes she is at increased risk of heart disease. - Ambulatory referral to Cardiology  3. Postoperative hypothyroidism Patient will have TSH in follow-up of postoperative hypothyroidism and will be notified if a change is needed in the dose of her thyroid medication based on this lab. - TSH  4. Breast pain, left Patient with complaint of left breast pain and pain/fullness in the left axilla.  Patient will be scheduled for diagnostic mammogram and ultrasound of the left breast/axilla.  Patient was provided with information to apply for scholarship to cover for help with the cost of these studies. - MM Digital Diagnostic Bilat; Future - US BREAST LTD UNI LEFT INC AXILLA; Future  5. Right upper quadrant abdominal pain Patient with complaint of continued right upper and mid right abdominal pain.  She does report history of constipation and reports that she has also had prior normal colonoscopy.  Patient may require prior imaging and this will be discussed at her next visit in 2 weeks.  On review of her chart, she did have evidence of a 11.7 REM calcified mass probably arising from the uterus which may represent a large partial calcified uterine fibroid but malignant etiology was not excluded based on CT which was noted on CT of the abdomen and pelvis done 12/04/2017.  Patient with normal LFTs done during recent emergency department visit.  Lipase was also normal.  6. Diabetic polyneuropathy associated with type 2 diabetes mellitus ([place]) She reports needing a higher dose of gabapentin to help with diabetic neuropathy.  New prescription provided so that patient can take 600 mg of gabapentin 3 times daily which is an increase from her current 300 mg dose. - gabapentin (NEURONTIN) 300 MG capsule; 2 pills (600mg ) twice per day and at  bedtime for neuropathic pain  Dispense: 180 capsule; Refill: 3  7. Chest pain, unspecified type Patient is being referred to cardiology for her left-sided chest, upper back and left arm pain and she also has diabetes which increases her risk for heart disease.  None - Ambulatory referral to Cardiology   Outpatient Encounter Medications as of 02/16/2019  Medication Sig  . acyclovir (ZOVIRAX) 400 MG tablet Take 400 mg by mouth 3 (three) times daily.  . carvedilol (COREG) 25 MG tablet Take 25 mg by mouth 2 (two) times daily with a meal.  . furosemide (LASIX) 40 MG tablet Take 40 mg by mouth 2 (two) times daily.  Marland Kitchen gabapentin (NEURONTIN) 300 MG capsule 2 pills (600mg ) twice per day and at bedtime for neuropathic pain  . glipiZIDE (GLUCOTROL) 10 MG tablet Take 10 mg by mouth 2 (two) times daily before a meal.  . Insulin Glargine (BASAGLAR KWIKPEN) 100 UNIT/ML SOPN Inject 25 Units into the skin at bedtime.   Marland Kitchen losartan (COZAAR) 25 MG tablet Take 25 mg by mouth daily.  . metFORMIN (GLUCOPHAGE) 1000 MG tablet Take 1,000 mg by mouth 2 (two) times daily with a meal.  . spironolactone (ALDACTONE) 25 MG tablet Take 25 mg by mouth daily.  Marland Kitchen SYNTHROID 150 MCG tablet Take 150 mcg by mouth daily.  . [DISCONTINUED] gabapentin (NEURONTIN) 300 MG capsule Take 300 mg by mouth 2 (two) times daily  as needed (pain).    No facility-administered encounter medications on file as of 02/16/2019.    An After Visit Summary was printed and given to the patient.   Follow-up: Return in about 2 weeks (around 03/02/2019) for DM/pain-ED if symptoms acutely worsen.    Antony Blackbird MD

## 2019-02-17 LAB — BASIC METABOLIC PANEL WITH GFR
BUN/Creatinine Ratio: 13 (ref 9–23)
BUN: 14 mg/dL (ref 6–24)
CO2: 25 mmol/L (ref 20–29)
Calcium: 9.6 mg/dL (ref 8.7–10.2)
Chloride: 100 mmol/L (ref 96–106)
Creatinine, Ser: 1.07 mg/dL — ABNORMAL HIGH (ref 0.57–1.00)
GFR calc Af Amer: 68 mL/min/1.73
GFR calc non Af Amer: 59 mL/min/1.73 — ABNORMAL LOW
Glucose: 104 mg/dL — ABNORMAL HIGH (ref 65–99)
Potassium: 4 mmol/L (ref 3.5–5.2)
Sodium: 142 mmol/L (ref 134–144)

## 2019-02-17 LAB — HEMOGLOBIN A1C
Est. average glucose Bld gHb Est-mCnc: 243 mg/dL
Hgb A1c MFr Bld: 10.1 % — ABNORMAL HIGH (ref 4.8–5.6)

## 2019-02-17 LAB — TSH: TSH: 1.82 u[IU]/mL (ref 0.450–4.500)

## 2019-03-03 ENCOUNTER — Ambulatory Visit: Payer: Self-pay | Admitting: Family Medicine

## 2019-03-09 ENCOUNTER — Other Ambulatory Visit (HOSPITAL_COMMUNITY): Payer: Self-pay | Admitting: *Deleted

## 2019-03-09 DIAGNOSIS — N6320 Unspecified lump in the left breast, unspecified quadrant: Secondary | ICD-10-CM

## 2019-03-09 DIAGNOSIS — N644 Mastodynia: Secondary | ICD-10-CM

## 2019-03-13 ENCOUNTER — Ambulatory Visit (INDEPENDENT_AMBULATORY_CARE_PROVIDER_SITE_OTHER): Payer: Self-pay | Admitting: Cardiology

## 2019-03-13 ENCOUNTER — Ambulatory Visit (INDEPENDENT_AMBULATORY_CARE_PROVIDER_SITE_OTHER): Payer: Self-pay | Admitting: Family Medicine

## 2019-03-13 ENCOUNTER — Encounter: Payer: Self-pay | Admitting: Cardiology

## 2019-03-13 ENCOUNTER — Other Ambulatory Visit: Payer: Self-pay

## 2019-03-13 VITALS — BP 172/107 | HR 84 | Temp 97.0°F | Ht 63.0 in | Wt 175.0 lb

## 2019-03-13 DIAGNOSIS — I5030 Unspecified diastolic (congestive) heart failure: Secondary | ICD-10-CM

## 2019-03-13 DIAGNOSIS — I110 Hypertensive heart disease with heart failure: Secondary | ICD-10-CM

## 2019-03-13 DIAGNOSIS — I10 Essential (primary) hypertension: Secondary | ICD-10-CM

## 2019-03-13 DIAGNOSIS — M79622 Pain in left upper arm: Secondary | ICD-10-CM

## 2019-03-13 DIAGNOSIS — E119 Type 2 diabetes mellitus without complications: Secondary | ICD-10-CM

## 2019-03-13 DIAGNOSIS — Z794 Long term (current) use of insulin: Secondary | ICD-10-CM

## 2019-03-13 MED ORDER — SPIRONOLACTONE 25 MG PO TABS: 25.0000 mg | ORAL_TABLET | Freq: Every day | ORAL | 1 refills | Status: DC

## 2019-03-13 MED ORDER — AMOXICILLIN 875 MG PO TABS: 875.0000 mg | ORAL_TABLET | Freq: Two times a day (BID) | ORAL | 0 refills | Status: DC

## 2019-03-13 MED ORDER — NAPROXEN 500 MG PO TABS: 500.0000 mg | ORAL_TABLET | Freq: Two times a day (BID) | ORAL | 0 refills | Status: DC

## 2019-03-13 NOTE — Progress Notes (Signed)
Virtual Visit via Telephone Note  I connected with Suzanne Cline on 03/13/19 at 10:50 AM EST by telephone and verified that I am speaking with the correct person using two identifiers.   I discussed the limitations, risks, security and privacy concerns of performing an evaluation and management service by telephone and the availability of in person appointments. I also discussed with the patient that there may be a patient responsible charge related to this service. The patient expressed understanding and agreed to proceed.  Patient Location: Home Provider Location: Home Office Others participating in call: none   History of Present Illness:      53 yo female who is seen in follow-up of type 2 diabetes, hypertension with history of possible heart failure and patient with recurrent issues with left axillary and left breast pain.  Patient reports that she continues to have pain in her left armpit that radiates into the left breast/left chest area.  Pain ranges from about a 6-8 on a 0-to-10 scale.  Pain can increase with movements of her arm.  She denies any fever or chills.  She states that the area is tender to touch.  She is not sure if there is any increased warmth.  She has had similar pain about a year ago which went away but now she has had pain for the past 3 months in this area.  She reports that someone from the breast center did get in touch with her and she states that she has an appointment for breast exam on 03/30/2019.  On review of chart, it appears that she actually has diagnostic mammogram and ultrasound scheduled for 03/30/2019.         She reports that her blood sugars have improved since she restarted her insulin regularly.  She is on 25 units per twice daily of Basaglar and she reports that her fasting blood sugars are now in the 140s or less.  She denies any current issues with increased thirst or urinary frequency.  She is taking her blood pressure medication daily as well.  She  denies current headache or dizziness related to her blood pressures.  She denies any peripheral edema related to her history of CHF.  She does have an upcoming cardiology appointment.  Patient recently moved from the [place] area and her cardiology records and prior cardiologist are in [place].   Past Medical History:  Diagnosis Date  . CHF (congestive heart failure) ([place])   . Diabetes mellitus without complication ([place])   . Hypertension     Past Surgical History:  Procedure Laterality Date  . ABLATION     uterus  . THYROIDECTOMY      Family History  Problem Relation Age of Onset  . Diabetes Mother   . Breast cancer Neg Hx     Social History   Tobacco Use  . Smoking status: Current Every Day Smoker    Packs/day: 0.50  . Smokeless tobacco: Never Used  Substance Use Topics  . Alcohol use: Yes    Comment: social  . Drug use: Never     Allergies  Allergen Reactions  . Lisinopril Cough  . Oxycodone Itching       Observations/Objective: No vital signs or physical exam conducted as visit was done via telephone  Assessment and Plan: 1. Type 2 diabetes mellitus without complication, with long-term current use of insulin (HCC) Discussed with the patient that her hemoglobin A1c done at her recent new patient visit was elevated at 10.1 indicating  that her blood sugars had not been well controlled over the past 90 days.  Patient reports that her blood sugars are improved since restarting her insulin regularly.  She is encouraged to continue her insulin regimen and to continue monitoring of her blood sugars.  2. Left axillary pain Patient with complaint of continued left axillary and left lateral breast pain.  Patient has diagnostic mammogram and ultrasound scheduled for 03/30/2019 per chart.  Prescription provided for naproxen 500 mg twice daily to take as needed for pain and prescription provided for amoxicillin 875 mg twice daily x10 days in case there is an infectious cause of  her left breast pain and left axillary pain.  If patient has increased pain, fever, chest pain, shortness of breath or any other concerns she should go to the emergency department for further evaluation. - naproxen (NAPROSYN) 500 MG tablet; Take 1 tablet (500 mg total) by mouth 2 (two) times daily with a meal.  Dispense: 60 tablet; Refill: 0 - amoxicillin (AMOXIL) 875 MG tablet; Take 1 tablet (875 mg total) by mouth 2 (two) times daily.  Dispense: 20 tablet; Refill: 0  3. Essential hypertension She reports that her blood pressure has been fairly well controlled on her chronic medications.  She requests refill of spironolactone at today's visit.  She is to continue the use of carvedilol and Lasix.  She has upcoming appointment with cardiology regarding her hypertension and history of CHF. - spironolactone (ALDACTONE) 25 MG tablet; Take 1 tablet (25 mg total) by mouth daily.  Dispense: 90 tablet; Refill: 1  Follow Up Instructions: Follow-up in 2 to 3 weeks after mammogram, sooner if needed    I discussed the assessment and treatment plan with the patient. The patient was provided an opportunity to ask questions and all were answered. The patient agreed with the plan and demonstrated an understanding of the instructions.   The patient was advised to call back or seek an in-person evaluation if the symptoms worsen or if the condition fails to improve as anticipated.  I provided 16 minutes of non-face-to-face time during this encounter.   Antony Blackbird, MD

## 2019-03-13 NOTE — Progress Notes (Signed)
Primary Care Provider: Antony Blackbird, MD Cardiologist: No primary care provider on file. Electrophysiologist:   Clinic Note: Chief Complaint  Patient presents with  . New Patient (Initial Visit)    History of heart failure; does not know details.  Last seen by cardiology in 2018  . Chest Pain    Left breast pain     HPI:    Suzanne Cline is a 53 y.o. female with HTN, DM-2 (uncontrolled) & reported ?CHF who is being seen today for the [place] at the request of Antony Blackbird, MD.  [facility] during peri-OP time-frame for Thyroid Sgx (goiter)--> 23 beats NSVT (seen by Cardiology) (has been 2 years since last seen by Cardiology  Suzanne Cline was followed by [facility] down in the [place] area for several years after being treated for some heart failure symptoms.  She indicates that her last echocardiogram was in 2017.  Apparently she has had a history of a stress test (sounds like Lexiscan Myoview based on her description)--both of which she indicated did not lead to a catheterization.  Recent Hospitalizations:  01/23/2019: ED presentation L upper arm/shoulder & R abdomen pain (for > 1 yr); also noted L-sided chest wall/midaxillary pain.   Reviewed  CV studies:    The following studies were reviewed today: (if available, images/films reviewed: From Epic Chart or Care Everywhere) . Echo 08/2008:  EF 50-55%. No RWMA. Mild Conc LVH, Gr 1 DD.   Marland Kitchen Last echo per her - 2017    Interval History:   Suzanne Cline is here to establish cardiology care, but really all she can talk about is that swelling tender painful is on her left chest/breast.  This is limiting her sleep and even mild activity because of discomfort.  She is almost nauseated because of the discomfort.  She also notes some right upper quadrant discomfort.  Since her last cardiology evaluation, she really has not noticed any significant edema, PND or orthopnea  but has had some exertional shortness of breath.  She takes Lasix maybe twice a week as opposed to 2 times a day.  She does based on sliding scale. She clearly was being managed by cardiology since she has carvedilol losartan and spironolactone all ordered but she is taking relatively well.  She is currently taking amoxicillin for what was thought to be potential cellulitis in the left breast.  CV Review of Symptoms (Summary) positive for - Left lateral chest pain as noted-not at all related to exertion or rest.  It is persistent external pain negative for - dyspnea on exertion, edema, irregular heartbeat, orthopnea, palpitations, paroxysmal nocturnal dyspnea, rapid heart rate, shortness of breath or Syncope/near syncope, TIA/amaurosis fugax; claudication Many of her symptoms are somewhat clouded by the fact that she is so uncomfortable.  She is not been doing very much over the last couple weeks.  The patient does not have symptoms concerning for COVID-19 infection (fever, chills, cough, or new shortness of breath).  The patient is practicing social distancing. ++ Masking.  Carefully going out for groceries/shopping. Not currently @ Work   REVIEWED OF SYSTEMS   A comprehensive ROS was performed. Review of Systems  Constitutional: Negative for malaise/fatigue (Has not really been feeling well since the onset of discomfort in her chest and back) and weight loss.  HENT: Negative for congestion and nosebleeds.   Respiratory: Negative for cough and shortness of breath.   Cardiovascular: Positive for chest pain (Left upper  lateral from mid clavicular-all the way to the mid axillary line.;  Associated with firm swelling and warmth). Negative for leg swelling.  Gastrointestinal: Positive for abdominal pain (Right upper quadrant) and constipation. Negative for blood in stool, heartburn and melena.  Genitourinary: Negative for hematuria.  Musculoskeletal: Positive for joint pain (Diffuse  osteoarthritis pains). Negative for falls.  Neurological: Negative for dizziness, focal weakness and headaches.  Psychiatric/Behavioral: Negative for depression, hallucinations and memory loss. The patient is nervous/anxious. The patient does not have insomnia.   All other systems reviewed and are negative.  I have reviewed and (if needed) personally updated the patient's problem list, medications, allergies, past medical and surgical history, social and family history.   PAST MEDICAL HISTORY   Past Medical History:  Diagnosis Date  . CHF (congestive heart failure) ([place])    per Report  . Diabetes mellitus without complication ([place])   . Hypertension     PAST SURGICAL HISTORY   Past Surgical History:  Procedure Laterality Date  . ABLATION     uterus  . THYROIDECTOMY       MEDICATIONS/ALLERGIES   Current Meds  Medication Sig  . acyclovir (ZOVIRAX) 400 MG tablet Take 400 mg by mouth 3 (three) times daily.  Marland Kitchen amoxicillin (AMOXIL) 875 MG tablet Take 1 tablet (875 mg total) by mouth 2 (two) times daily.  . carvedilol (COREG) 25 MG tablet Take 25 mg by mouth 2 (two) times daily with a meal.  . furosemide (LASIX) 40 MG tablet Take 40 mg by mouth 2 (two) times daily.  Marland Kitchen gabapentin (NEURONTIN) 300 MG capsule 2 pills (600mg ) twice per day and at bedtime for neuropathic pain  . glipiZIDE (GLUCOTROL) 10 MG tablet Take 10 mg by mouth 2 (two) times daily before a meal.  . Insulin Glargine (BASAGLAR KWIKPEN) 100 UNIT/ML SOPN Inject 25 Units into the skin at bedtime.   Marland Kitchen losartan (COZAAR) 25 MG tablet Take 25 mg by mouth daily.  . metFORMIN (GLUCOPHAGE) 1000 MG tablet Take 1,000 mg by mouth 2 (two) times daily with a meal.  . naproxen (NAPROSYN) 500 MG tablet Take 1 tablet (500 mg total) by mouth 2 (two) times daily with a meal.  . spironolactone (ALDACTONE) 25 MG tablet Take 1 tablet (25 mg total) by mouth daily.  Marland Kitchen SYNTHROID 150 MCG tablet Take 150 mcg by mouth daily.    Allergies   Allergen Reactions  . Lisinopril Cough  . Oxycodone Itching     SOCIAL HISTORY/FAMILY HISTORY   Social History   Tobacco Use  . Smoking status: Current Every Day Smoker    Packs/day: 0.50  . Smokeless tobacco: Never Used  Substance Use Topics  . Alcohol use: Yes    Comment: social  . Drug use: Never   Social History   Social History Narrative  . Not on file    Family History family history includes Diabetes in her mother. Otherwise, she is not all that sure of her family medical history  OBJCTIVE -PE, EKG, labs   Wt Readings from Last 3 Encounters:  03/13/19 175 lb (79.4 kg)  02/16/19 175 lb (79.4 kg)  12/04/17 176 lb (79.8 kg)    Physical Exam: BP (!) 172/107   Pulse 84   Temp (!) 97 F (36.1 C)   Ht 5\' 3"  (1.6 m)   Wt 175 lb (79.4 kg)   SpO2 98%   BMI 31.00 kg/m  Physical Exam  Constitutional: She is oriented to person, place, and time. She  appears well-developed and well-nourished.  Relatively uncomfortable appearing woman.  Notes being significant pain from her breast.  Not sleeping well because of discomfort. Otherwise healthy-appearing.  Well-groomed  HENT:  Head: Normocephalic and atraumatic.  Eyes: Pupils are equal, round, and reactive to light. EOM are normal. No scleral icterus.  Neck: Normal range of motion. Neck supple. No hepatojugular reflux and no JVD present. Carotid bruit is not present. No thyromegaly present.  Cardiovascular: Normal rate, regular rhythm, S1 normal and S2 normal.  Occasional extrasystoles are present. Exam reveals distant heart sounds. Exam reveals no gallop and no decreased pulses.  No murmur heard. Very tender to auscultate in the apical region.  I did not attempt to do this because of pain with stethoscope placement.  Unable to palpate PMI mostly because of discomfort.  Pulmonary/Chest: Effort normal and breath sounds normal. No respiratory distress. She has no wheezes. She exhibits tenderness (Significant tenderness and  swelling along the left lateral breast into the left axilla and and lateral wall.;  Tenderness to palpation from mid clavicular line all the way to the mid axillary line in about a 5 rib distribution over the breast.).  Abdominal: Soft. Bowel sounds are normal. She exhibits no distension. There is no abdominal tenderness. There is no rebound.  Musculoskeletal: Normal range of motion.        General: No edema.  Neurological: She is alert and oriented to person, place, and time. No cranial nerve deficit.  Skin: Skin is warm and dry. She is not diaphoretic. No erythema.  Psychiatric: She has a normal mood and affect. Her behavior is normal. Judgment and thought content normal.  Vitals reviewed.    Adult ECG Report  Rate: 84 ;  Rhythm: normal sinus rhythm, premature atrial contractions (PAC) and Biatrial enlargement, borderline left axis deviation/LAFB.  (-50 degrees)  Narrative Interpretation: Borderline EKG.  Recent Labs:   No results found for: CHOL, HDL, LDLCALC, LDLDIRECT, TRIG, CHOLHDL Lab Results  Component Value Date   CREATININE 1.07 (H) 02/16/2019   BUN 14 02/16/2019   NA 142 02/16/2019   K 4.0 02/16/2019   CL 100 02/16/2019   CO2 25 02/16/2019    ASSESSMENT/PLAN    Problem List Items Addressed This Visit    Hypertensive heart disease with diastolic heart failure ([place]) - Primary (Chronic)   Relevant Orders   EKG 12-Lead (Completed)     Unfortunately, I do not know much else about her care.  Do not have any records from [facility] clinic That.  We will try to send off of these today, and update her history accordingly.  For now, I would hold off on a formal evaluation of her heart until the breast tissue is taken care of.  If there is an indication that she would potentially need surgery, then I would probably recommend an echocardiogram prior to surgery just to confirm her EF etc.  I am going to hold off on doing that however because it is so uncomfortable for her mostly in the  apical location for echo imaging.  Blood pressure is quite high today but she is in quite a bit of pain.  I anticipate him to titrate up losartan a little bit further, would like to assess her when she is not as uncomfortable.  She is on max dose carvedilol without heart rate control. On standing as needed Lasix sliding scale with relatively well-controlled Unable to think she had any active heart failure symptoms and seems relatively euvolemic.  For now  studies, would like to get an echo once left breast is less painful.  Prior to doing that also reviewed, will need to get outside records.  We will have her return in roughly 3 weeks at which time we will know more of what this could be done about her painful left breast-for instance if you would potentially need surgery or biopsy.  Once the breast is less tender, would check 2D echo.  We will need to see what the outside records show as far as ischemic evaluation, but with right now her chest pain is clearly musculoskeletal or skin related and not internal cardiac.   COVID-19 Education: The signs and symptoms of COVID-19 were discussed with the patient and how to seek care for testing (follow up with PCP or arrange E-visit).   The importance of social distancing was discussed today.  I spent a total of 36minutes with the patient and chart review. >  50% of the time was spent in direct patient consultation.  Additional time spent with chart review (studies, outside notes, etc): 10 Total Time: 35 min   Current medicines are reviewed at length with the patient today.  (+/- concerns)  n/a   Patient Instructions / Medication Changes & Studies & Tests Ordered   Patient Instructions  Medication Instructions:  Losartan for tonight only - double your dose  *If you need a refill on your cardiac medications before your next appointment, please call your pharmacy*  Lab Work: Not needed I  Testing/Procedures: Not needed  Follow-Up: At [facility], you and your health needs are our priority.  As part of our continuing mission to provide you with exceptional heart care, we have created designated Provider Care Teams.  These Care Teams include your primary Cardiologist (physician) and Advanced Practice Providers (APPs -  Physician Assistants and Nurse Practitioners) who all work together to provide you with the care you need, when you need it.  Your next appointment:   3 weeks   The format for your next appointment:   In Person  Provider:   Dr Ellyn Hack or Curt Bears  Other Instructions   Jory Sims, DNP   Studies Ordered:   Orders Placed This Encounter  Procedures  . EKG 12-Lead     Glenetta Hew, M.D., M.S. Interventional Cardiologist   Pager # [phone] Phone # [phone] [address] [place], [place] [address]   Thank you for choosing Heartcare at [facility]!!

## 2019-03-13 NOTE — Patient Instructions (Signed)
Medication Instructions:  Losartan for tonight only - double your dose  *If you need a refill on your cardiac medications before your next appointment, please call your pharmacy*  Lab Work: Not needed I  Testing/Procedures: Not needed  Follow-Up: At [facility], you and your health needs are our priority.  As part of our continuing mission to provide you with exceptional heart care, we have created designated Provider Care Teams.  These Care Teams include your primary Cardiologist (physician) and Advanced Practice Providers (APPs -  Physician Assistants and Nurse Practitioners) who all work together to provide you with the care you need, when you need it.  Your next appointment:   3 weeks   The format for your next appointment:   In Person  Provider:   Dr Ellyn Hack or Curt Bears  Other Instructions

## 2019-03-14 ENCOUNTER — Telehealth: Payer: Self-pay | Admitting: Cardiology

## 2019-03-14 NOTE — Telephone Encounter (Signed)
Requested records from [facility] on 03/13/19 fsw

## 2019-03-15 ENCOUNTER — Encounter: Payer: Self-pay | Admitting: Family Medicine

## 2019-03-15 ENCOUNTER — Encounter: Payer: Self-pay | Admitting: Cardiology

## 2019-03-20 NOTE — Telephone Encounter (Signed)
Received medical records from  [facility] records to [facility]  03/20/19  fsw

## 2019-03-30 ENCOUNTER — Ambulatory Visit
Admission: RE | Admit: 2019-03-30 | Discharge: 2019-03-30 | Disposition: A | Discharge status: Home / Self Care | Payer: No Typology Code available for payment source | Source: Ambulatory Visit | Attending: Obstetrics and Gynecology | Admitting: Obstetrics and Gynecology

## 2019-03-30 ENCOUNTER — Other Ambulatory Visit (HOSPITAL_COMMUNITY): Payer: Self-pay | Admitting: Obstetrics and Gynecology

## 2019-03-30 ENCOUNTER — Encounter (HOSPITAL_COMMUNITY): Payer: Self-pay

## 2019-03-30 ENCOUNTER — Ambulatory Visit (HOSPITAL_COMMUNITY)
Admission: RE | Admit: 2019-03-30 | Discharge: 2019-03-30 | Disposition: A | Discharge status: Home / Self Care | Payer: Self-pay | Source: Ambulatory Visit | Attending: Obstetrics and Gynecology | Admitting: Obstetrics and Gynecology

## 2019-03-30 ENCOUNTER — Other Ambulatory Visit: Payer: Self-pay

## 2019-03-30 DIAGNOSIS — N6320 Unspecified lump in the left breast, unspecified quadrant: Secondary | ICD-10-CM

## 2019-03-30 DIAGNOSIS — N644 Mastodynia: Secondary | ICD-10-CM

## 2019-03-30 DIAGNOSIS — N6489 Other specified disorders of breast: Secondary | ICD-10-CM

## 2019-03-30 DIAGNOSIS — N898 Other specified noninflammatory disorders of vagina: Secondary | ICD-10-CM | POA: Insufficient documentation

## 2019-03-30 DIAGNOSIS — Z01419 Encounter for gynecological examination (general) (routine) without abnormal findings: Secondary | ICD-10-CM

## 2019-03-30 NOTE — Progress Notes (Signed)
Complaints of left axillary lump and left outer breast pain x 3 months. Patient states the lump has decreased in size and the pain is constant. Patient rates the pain at a 10 out of 10. Patient stated she was given Amoxicillin on 03/13/2019 that she only took for a week and didn't not notice improvement. Patient stated she did not take all of her antibiotic.   Patient complained of right outer breast pain x 3 months that comes and goes. Patient rates the pain at a 6 out of 10.   Pap Smear: Pap smear completed today. Last Pap smear was over 3 years ago in [place] and normal per patient. Per patient has a history of an abnormal Pap smear 5-10 years ago that a colposcopy was completed for follow-up. Patient stated she has had at least three normal Pap smears since colposcopy. No Pap smear results are in Epic.  Physical exam: Breasts Breasts symmetrical. No skin abnormalities bilateral breasts. No nipple retraction bilateral breasts. No nipple discharge bilateral breasts. No lymphadenopathy. No lumps palpated bilateral breasts. Unable to palpate a lump in patients area of concern within the left axilla. Complaints of bilateral diffuse breast pain on exam. Referred patient to the [facility] for a diagnostic mammogram. Appointment scheduled for Thursday, March 30, 2019 at 1400.        Pelvic/Bimanual   Ext Genitalia No lesions, no swelling and no discharge observed on external genitalia.        Vagina Vagina pink and normal texture. No lesions and yellowish colored discharge observed in vagina. Wet prep completed.          Cervix Cervix is present. Cervix pink and of normal texture. Yellowish colored discharge observed on cervix.    Uterus Uterus is present and palpable. Uterus in normal position. Uterus enlarged and irregular. Patient stated she has of uterine fibroids and requested a referral. Sent referral to the [facility]. Patient informed that [facility] will  not cover and has a financial assistance application.    Adnexae Bilateral ovaries present and palpable. No tenderness on palpation.         Rectovaginal No rectal exam completed today since patient had no rectal complaints. No skin abnormalities observed on exam.    Smoking History: Patient has never smoked.  Patient Navigation: Patient education provided. Access to services provided for patient through [facility] program.   Colorectal Cancer Screening: Per patient had a colonoscopy completed in 2018. No complaints today.  Breast and Cervical Cancer Risk Assessment: Patient has no family history of breast cancer, known genetic mutations, or radiation treatment to the chest before age 41. Per patient has a history of cervical dysplasia. Patient has no history of being immunocompromised or DES exposure in-utero.  Risk Assessment    Risk Scores      03/30/2019   Last edited by: Loletta Parish, RN   5-year risk: 1.3 %   Lifetime risk: 8 %

## 2019-03-30 NOTE — Patient Instructions (Signed)
Explained breast self awareness with Patric Dykes. Let patient know [facility] will cover Pap smears and HPV typing every 5 years unless has a history of abnormal Pap smears. Referred patient to the [facility] for a diagnostic mammogram. Appointment scheduled for Thursday, March 30, 2019 at 1400. Patient aware of appointment and will be there. Let patient know will follow up with her within the next couple weeks with results of Pap smear and wet prep by phone. Suzanne Cline verbalized understanding.  Terrina Docter, Arvil [place], RN 1:14 PM

## 2019-03-31 LAB — CERVICOVAGINAL ANCILLARY ONLY
Bacterial Vaginitis (gardnerella): NEGATIVE
Candida Glabrata: NEGATIVE
Candida Vaginitis: NEGATIVE
Comment: NEGATIVE
Comment: NEGATIVE
Comment: NEGATIVE
Comment: NEGATIVE
Trichomonas: POSITIVE — AB

## 2019-04-03 ENCOUNTER — Encounter (HOSPITAL_COMMUNITY): Payer: Self-pay

## 2019-04-03 ENCOUNTER — Other Ambulatory Visit: Payer: Self-pay | Admitting: Obstetrics and Gynecology

## 2019-04-03 ENCOUNTER — Ambulatory Visit: Payer: Self-pay | Admitting: Cardiology

## 2019-04-03 ENCOUNTER — Telehealth: Payer: Self-pay | Admitting: *Deleted

## 2019-04-03 LAB — CYTOLOGY - PAP
Comment: NEGATIVE
Diagnosis: NEGATIVE
High risk HPV: NEGATIVE

## 2019-04-03 MED ORDER — METRONIDAZOLE 500 MG PO TABS: 500.0000 mg | ORAL_TABLET | Freq: Two times a day (BID) | ORAL | 0 refills | Status: DC

## 2019-04-03 NOTE — Telephone Encounter (Signed)
Attempted to call patient to discuss wet prep results. No one answered phone. Left voicemail for patient to call me back.

## 2019-04-05 ENCOUNTER — Telehealth (HOSPITAL_COMMUNITY): Payer: Self-pay | Admitting: *Deleted

## 2019-04-05 NOTE — Telephone Encounter (Signed)
Attempted to call patient again to discuss wet prep results. No one answered phone and unable to leave voicemail.

## 2019-04-06 ENCOUNTER — Telehealth (HOSPITAL_COMMUNITY): Payer: Self-pay | Admitting: *Deleted

## 2019-04-06 NOTE — Telephone Encounter (Signed)
Called patient to discuss Pap smear and wet prep results. Let patient know her Pap smear is normal and HPV negative. Informed patient that her wet prep showed Trichomonas. Patient stated her last two Pap smears have showed Trichomonas and she was given antibiotics. Explained to patient that her partner would need to get treated to avoid reinfection. Patient stated she is is no longer with that partner and not been sexually active in the past year. Verified patients pharmacy. Advised patient to take the antibiotic prescribed and either go to the [facility] Department or her provider to have testing completed after antibiotic completed to ensure the infection has resolved. Patient verbalized understanding.

## 2019-04-10 ENCOUNTER — Ambulatory Visit: Payer: No Typology Code available for payment source

## 2019-04-12 MED FILL — GABAPENTIN 300 MG CAPSULE: 300 | 30 days supply | Qty: 180 | Fill #1

## 2019-04-14 ENCOUNTER — Ambulatory Visit: Payer: No Typology Code available for payment source | Admitting: Family Medicine

## 2019-04-24 ENCOUNTER — Other Ambulatory Visit: Payer: Self-pay

## 2019-04-24 ENCOUNTER — Emergency Department (HOSPITAL_COMMUNITY): Payer: No Typology Code available for payment source

## 2019-04-24 ENCOUNTER — Encounter (HOSPITAL_COMMUNITY): Payer: Self-pay | Admitting: *Deleted

## 2019-04-24 ENCOUNTER — Emergency Department (HOSPITAL_COMMUNITY)
Admission: EM | Admit: 2019-04-24 | Discharge: 2019-04-25 | Disposition: A | Discharge status: Home / Self Care | Payer: No Typology Code available for payment source | Attending: Emergency Medicine | Admitting: Emergency Medicine

## 2019-04-24 DIAGNOSIS — M79622 Pain in left upper arm: Secondary | ICD-10-CM

## 2019-04-24 DIAGNOSIS — D259 Leiomyoma of uterus, unspecified: Secondary | ICD-10-CM | POA: Insufficient documentation

## 2019-04-24 DIAGNOSIS — Z87891 Personal history of nicotine dependence: Secondary | ICD-10-CM | POA: Insufficient documentation

## 2019-04-24 DIAGNOSIS — Z79899 Other long term (current) drug therapy: Secondary | ICD-10-CM | POA: Insufficient documentation

## 2019-04-24 DIAGNOSIS — I110 Hypertensive heart disease with heart failure: Secondary | ICD-10-CM | POA: Insufficient documentation

## 2019-04-24 DIAGNOSIS — I509 Heart failure, unspecified: Secondary | ICD-10-CM | POA: Insufficient documentation

## 2019-04-24 DIAGNOSIS — Z7984 Long term (current) use of oral hypoglycemic drugs: Secondary | ICD-10-CM | POA: Insufficient documentation

## 2019-04-24 DIAGNOSIS — G8929 Other chronic pain: Secondary | ICD-10-CM

## 2019-04-24 DIAGNOSIS — E119 Type 2 diabetes mellitus without complications: Secondary | ICD-10-CM | POA: Insufficient documentation

## 2019-04-24 LAB — COMPREHENSIVE METABOLIC PANEL
ALT: 15 U/L (ref 0–44)
AST: 18 U/L (ref 15–41)
Albumin: 4.1 g/dL (ref 3.5–5.0)
Alkaline Phosphatase: 49 U/L (ref 38–126)
Anion gap: 11 (ref 5–15)
BUN: 21 mg/dL — ABNORMAL HIGH (ref 6–20)
CO2: 26 mmol/L (ref 22–32)
Calcium: 9.7 mg/dL (ref 8.9–10.3)
Chloride: 100 mmol/L (ref 98–111)
Creatinine, Ser: 1 mg/dL (ref 0.44–1.00)
GFR calc Af Amer: >60 mL/min (ref >60)
GFR calc non Af Amer: >60 mL/min (ref >60)
Glucose, Bld: 78 mg/dL (ref 70–99)
Potassium: 4 mmol/L (ref 3.5–5.1)
Sodium: 137 mmol/L (ref 135–145)
Total Bilirubin: 0.4 mg/dL (ref 0.3–1.2)
Total Protein: 7.3 g/dL (ref 6.5–8.1)

## 2019-04-24 LAB — CBC
HCT: 39 % (ref 36.0–46.0)
Hemoglobin: 12.6 g/dL (ref 12.0–15.0)
MCH: 28.4 pg (ref 26.0–34.0)
MCHC: 32.3 g/dL (ref 30.0–36.0)
MCV: 87.8 fL (ref 80.0–100.0)
Platelets: 241 10*3/uL (ref 150–400)
RBC: 4.44 MIL/uL (ref 3.87–5.11)
RDW: 13.5 % (ref 11.5–15.5)
WBC: 7.8 10*3/uL (ref 4.0–10.5)
nRBC: 0 % (ref 0.0–0.2)

## 2019-04-24 LAB — LIPASE, BLOOD: Lipase: 27 U/L (ref 11–51)

## 2019-04-24 MED ORDER — SODIUM CHLORIDE 0.9% FLUSH: 3.0000 mL | Freq: Once | INTRAVENOUS | Status: DC

## 2019-04-24 MED ORDER — IOHEXOL 300 MG/ML  SOLN
100.0000 mL | Freq: Once | INTRAMUSCULAR | Status: AC | PRN
  Administered 2019-04-24: 100 mL via INTRAVENOUS

## 2019-04-24 MED ORDER — SODIUM CHLORIDE (PF) 0.9 % IJ SOLN
INTRAMUSCULAR | Status: AC
  Filled 2019-04-24: qty 50

## 2019-04-24 MED ORDER — HYDROMORPHONE HCL 1 MG/ML IJ SOLN
1.0000 mg | Freq: Once | INTRAMUSCULAR | Status: AC
  Administered 2019-04-24: 1 mg via INTRAVENOUS
  Filled 2019-04-24: qty 1

## 2019-04-24 NOTE — ED Triage Notes (Signed)
Pt complains of right upper quadrant pain radiating to her back x 5 days. She states pain is worse right after eating. Pt also reports diarrhea. Pt denies nausea or vomiting.

## 2019-04-24 NOTE — ED Provider Notes (Signed)
[facility] DEPT Provider Note   CSN: [id number] Arrival date & time: 04/24/19  1756     History Chief Complaint  Patient presents with  . Abdominal Pain    Suzanne Cline is a 53 y.o. female.  Presents to emergency room with a chief complaint of abdominal pain.  Patient states that she has been having pain for over a year, states pain is worse on her right side, sharp, stabbing, ripping pain.  No alleviating factors, has not taken any medicine for this today.  Has had a couple loose stools, but last bowel movement was 3 days ago.  No vomiting, no fever.  No chest pain or difficulty in breathing.  States she has known uterine fibroid, has been evaluated by gynecology, has not had surgery in her abdomen.    HPI     Past Medical History:  Diagnosis Date  . CHF (congestive heart failure) ([place])    per Report  . Diabetes mellitus without complication ([place])   . Hypertension     Patient Active Problem List   Diagnosis Date Noted  . Well woman exam with routine gynecological exam 03/30/2019  . Vaginal discharge 03/30/2019  . Breast pain 03/30/2019  . Essential hypertension 03/02/2018  . Hypertensive heart disease with diastolic heart failure ([place]) 01/21/2017  . Postoperative hypothyroidism 01/21/2017  . Tobacco dependence 01/21/2017  . Type 2 diabetes mellitus without complication, with long-term current use of insulin ([place]) 01/21/2017    Past Surgical History:  Procedure Laterality Date  . ABLATION     uterus  . THYROIDECTOMY       OB History    Gravida  3   Para      Term      Preterm      AB      Living        SAB      TAB      Ectopic      Multiple      Live Births  2           Family History  Problem Relation Age of Onset  . Congestive Heart Failure Mother   . Breast cancer Neg Hx     Social History   Tobacco Use  . Smoking status: Former Smoker    Packs/day: 0.50  . Smokeless tobacco: Never Used   Substance Use Topics  . Alcohol use: Yes    Comment: social  . Drug use: Never    Home Medications Prior to Admission medications   Medication Sig Start Date End Date Taking? Authorizing Provider  atorvastatin (LIPITOR) 40 MG tablet Take 40 mg by mouth daily. 04/17/19  Yes [provider]  carvedilol (COREG) 25 MG tablet Take 25 mg by mouth 2 (two) times daily with a meal.   Yes [provider]  furosemide (LASIX) 40 MG tablet Take 40 mg by mouth at bedtime.    Yes [provider]  gabapentin (NEURONTIN) 300 MG capsule 2 pills (600mg ) twice per day and at bedtime for neuropathic pain Patient taking differently: Take 600 mg by mouth 2 (two) times daily.  02/16/19  Yes Fulp, Cammie, MD  glipiZIDE (GLUCOTROL) 10 MG tablet Take 10 mg by mouth 2 (two) times daily before a meal. 09/09/18  Yes [provider]  Insulin Glargine (BASAGLAR KWIKPEN) 100 UNIT/ML SOPN Inject 25 Units into the skin at bedtime.  09/09/18  Yes [provider]  losartan (COZAAR) 25 MG tablet Take 25  mg by mouth daily.   Yes [provider]  metFORMIN (GLUCOPHAGE) 1000 MG tablet Take 1,000 mg by mouth 2 (two) times daily with a meal.   Yes [provider]  metroNIDAZOLE (FLAGYL) 500 MG tablet Take 1 tablet (500 mg total) by mouth 2 (two) times daily. 04/03/19  Yes Constant, Peggy, MD  spironolactone (ALDACTONE) 25 MG tablet Take 1 tablet (25 mg total) by mouth daily. 03/13/19  Yes Fulp, Cammie, MD  SYNTHROID 150 MCG tablet Take 150 mcg by mouth daily. 09/09/18  Yes [provider]  amoxicillin (AMOXIL) 875 MG tablet Take 1 tablet (875 mg total) by mouth 2 (two) times daily. Patient not taking: Reported on 04/24/2019 03/13/19   Fulp, Ander Gaster, MD  ketorolac (TORADOL) 10 MG tablet Take 1 tablet (10 mg total) by mouth every 6 (six) hours as needed. 04/25/19   Lucrezia Starch, MD  naproxen (NAPROSYN) 500 MG tablet Take 1 tablet (500 mg total) by mouth 2 (two)  times daily as needed. 04/25/19   Lucrezia Starch, MD  ondansetron (ZOFRAN ODT) 4 MG disintegrating tablet Take 1 tablet (4 mg total) by mouth every 8 (eight) hours as needed for nausea or vomiting. 04/25/19   Lucrezia Starch, MD    Allergies    Lisinopril and Oxycodone  Review of Systems   Review of Systems  Constitutional: Negative for chills and fever.  HENT: Negative for ear pain and sore throat.   Eyes: Negative for pain and visual disturbance.  Respiratory: Negative for cough and shortness of breath.   Cardiovascular: Negative for chest pain and palpitations.  Gastrointestinal: Positive for abdominal pain and nausea. Negative for vomiting.  Genitourinary: Negative for dysuria and hematuria.  Musculoskeletal: Negative for arthralgias and back pain.  Skin: Negative for color change and rash.  Neurological: Negative for seizures and syncope.  All other systems reviewed and are negative.   Physical Exam Updated Vital Signs BP (!) 165/75   Pulse (!) 50   Temp 98.1 F (36.7 C) (Oral)   Resp 15   Ht 5\' 3"  (1.6 m)   Wt 80.7 kg   SpO2 98%   BMI 31.53 kg/m   Physical Exam Vitals and nursing note reviewed.  Constitutional:      General: She is not in acute distress.    Appearance: She is well-developed.  HENT:     Head: Normocephalic and atraumatic.  Eyes:     Conjunctiva/sclera: Conjunctivae normal.  Cardiovascular:     Rate and Rhythm: Normal rate and regular rhythm.     Heart sounds: No murmur.  Pulmonary:     Effort: Pulmonary effort is normal. No respiratory distress.     Breath sounds: Normal breath sounds.  Abdominal:     Palpations: Abdomen is soft.     Comments: Generalized tenderness in the right upper and lower quadrants, no rebound or guarding  Musculoskeletal:     Cervical back: Neck supple.  Skin:    General: Skin is warm and dry.  Neurological:     Mental Status: She is alert.     ED Results / Procedures / Treatments   Labs (all labs  ordered are listed, but only abnormal results are displayed) Labs Reviewed  COMPREHENSIVE METABOLIC PANEL - Abnormal; Notable for the following components:      Result Value   BUN 21 (*)    All other components within normal limits  URINALYSIS, ROUTINE W REFLEX MICROSCOPIC - Abnormal; Notable for the following components:  Specific Gravity, Urine >1.046 (*)    Leukocytes,Ua TRACE (*)    All other components within normal limits  LIPASE, BLOOD  CBC    EKG None  Radiology CT ABDOMEN PELVIS W CONTRAST  Result Date: 04/24/2019 CLINICAL DATA:  Right lower quadrant abdominal pain. EXAM: CT ABDOMEN AND PELVIS WITH CONTRAST TECHNIQUE: Multidetector CT imaging of the abdomen and pelvis was performed using the standard protocol following bolus administration of intravenous contrast. CONTRAST:  133mL OMNIPAQUE IOHEXOL 300 MG/ML  SOLN COMPARISON:  Right upper quadrant ultrasound earlier this day. CT 12/04/2017 FINDINGS: Lower chest: Breathing motion artifact. Scattered scarring in the lingula. Subsegmental atelectasis or scarring in both lower lobes. Minimal pleural thickening versus trace effusions. Hepatobiliary: Mild hepatic steatosis. No focal lesion. Gallbladder physiologically distended, no calcified stone. No biliary dilatation. Pancreas: No ductal dilatation or inflammation. Spleen: Normal in size without focal abnormality. Adrenals/Urinary Tract: No adrenal nodule. No hydronephrosis or perinephric edema. Homogeneous renal enhancement with symmetric excretion on delayed phase imaging. Subcentimeter low-density in the mid left kidney is too small to characterize but likely cyst. Urinary bladder is physiologically distended without wall thickening. Stomach/Bowel: Minimal distal esophageal wall thickening. Stomach is partially distended. No evidence of bowel obstruction. Normal air-filled appendix, series 5, image 58. Moderate volume of stool throughout the colon. No colonic wall thickening. Scattered  colonic diverticula without diverticulitis. Vascular/Lymphatic: Aortic atherosclerosis and tortuosity. No aortic aneurysm. The portal vein is patent. No enlarged lymph nodes in the abdomen or pelvis. Prominent lymph node in the left inguinal region measuring 9 mm short axis is unchanged from prior and likely reactive. Reproductive: Large peripherally calcified pelvic mass measuring 11.8 cm transverse with internal coarse calcifications, similar to prior exam and likely a large fibroid. No evidence of adnexal mass. Other: No free air or ascites. Musculoskeletal: There are no acute or suspicious osseous abnormalities. Degenerative change about the left sacroiliac joint, both hips, and lower lumbar spine. IMPRESSION: 1. Minimal distal esophageal wall thickening, can be seen with reflux or esophagitis. 2. No other acute abnormality in the abdomen/pelvis. Particularly, normal appendix. 3. Mild colonic diverticulosis without diverticulitis. 4. Peripherally calcified 11 cm mass in the pelvis is presumably a calcified uterine fibroid, not significantly changed from exam more than 1 year ago. Aortic Atherosclerosis (ICD10-I70.0). Electronically Signed   By: Keith Rake M.D.   On: 04/24/2019 22:09   US Abdomen Limited  Result Date: 04/24/2019 CLINICAL DATA:  Right upper quadrant pain EXAM: ULTRASOUND ABDOMEN LIMITED RIGHT UPPER QUADRANT COMPARISON:  CT abdomen and pelvis December 04, 2017 FINDINGS: Gallbladder: No gallstones or wall thickening visualized. There is no pericholecystic fluid. No sonographic Murphy sign noted by sonographer. Common bile duct: Diameter: 5 mm. No intrahepatic or extrahepatic biliary duct dilatation. Liver: No focal lesion identified. Within normal limits in parenchymal echogenicity. Portal vein is patent on color Doppler imaging with normal direction of blood flow towards the liver. Other: None. IMPRESSION: Study within normal limits. Electronically Signed   By: Lowella Grip III M.D.    On: 04/24/2019 21:05    Procedures Procedures (including critical care time)  Medications Ordered in ED Medications  sodium chloride flush (NS) 0.9 % injection 3 mL (has no administration in time range)  sodium chloride (PF) 0.9 % injection (has no administration in time range)  HYDROmorphone (DILAUDID) injection 1 mg (1 mg Intravenous Given 04/24/19 2112)  iohexol (OMNIPAQUE) 300 MG/ML solution 100 mL (100 mLs Intravenous Contrast Given 04/24/19 2146)    ED Course  I have reviewed  the triage vital signs and the nursing notes.  Pertinent labs & imaging results that were available during my care of the patient were reviewed by me and considered in my medical decision making (see chart for details).    MDM Rules/Calculators/A&P                      53 year old lady presented to ER with acute on chronic abdominal pain.  Here patient was noted be well-appearing with normal vital signs.  Extensive work-up including ultrasound, labs, CT scan was grossly negative for any acute pathology.  CT did note large uterine fibroid, she has follow-up with gynecology, this is unchanged from prior.  UA negative for infection.  Will discharge home with plan for pain control and PCP and gynecology recheck.    After the discussed management above, the patient was determined to be safe for discharge.  The patient was in agreement with this plan and all questions regarding their care were answered.  ED return precautions were discussed and the patient will return to the ED with any significant worsening of condition.   Final Clinical Impression(s) / ED Diagnoses Final diagnoses:  Chronic abdominal pain  Uterine leiomyoma, unspecified location    Rx / DC Orders ED Discharge Orders         Ordered    ondansetron (ZOFRAN ODT) 4 MG disintegrating tablet  Every 8 hours PRN     04/25/19 0032    naproxen (NAPROSYN) 500 MG tablet  2 times daily PRN     04/25/19 0032    ketorolac (TORADOL) 10 MG tablet   Every 6 hours PRN     04/25/19 0038           Lucrezia Starch, MD 04/25/19 [phone]

## 2019-04-25 LAB — URINALYSIS, ROUTINE W REFLEX MICROSCOPIC
Bacteria, UA: NONE SEEN
Bilirubin Urine: NEGATIVE
Glucose, UA: NEGATIVE mg/dL
Hgb urine dipstick: NEGATIVE
Ketones, ur: NEGATIVE mg/dL
Nitrite: NEGATIVE
Protein, ur: NEGATIVE mg/dL
Specific Gravity, Urine: >1.046 — ABNORMAL HIGH (ref 1.005–1.030)
pH: 5 (ref 5.0–8.0)

## 2019-04-25 MED ORDER — NAPROXEN 500 MG PO TABS: 500.0000 mg | ORAL_TABLET | Freq: Two times a day (BID) | ORAL | 0 refills | Status: DC | PRN

## 2019-04-25 MED ORDER — ONDANSETRON 4 MG PO TBDP: 4.0000 mg | ORAL_TABLET | Freq: Three times a day (TID) | ORAL | 0 refills | Status: DC | PRN

## 2019-04-25 MED ORDER — KETOROLAC TROMETHAMINE 10 MG PO TABS: 10.0000 mg | ORAL_TABLET | Freq: Four times a day (QID) | ORAL | 0 refills | Status: DC | PRN

## 2019-04-25 NOTE — Discharge Instructions (Signed)
Recommend following up both with your primary doctor and your gynecologist.  Return to ER for worsening vomiting, fever or other new concerning symptom.  Recommend taking prescribed naproxen and Zofran as needed.

## 2019-04-26 ENCOUNTER — Other Ambulatory Visit: Payer: Self-pay

## 2019-04-26 ENCOUNTER — Encounter: Payer: Self-pay | Admitting: Family Medicine

## 2019-04-26 ENCOUNTER — Ambulatory Visit (INDEPENDENT_AMBULATORY_CARE_PROVIDER_SITE_OTHER): Payer: Self-pay | Admitting: Family Medicine

## 2019-04-26 ENCOUNTER — Ambulatory Visit: Payer: No Typology Code available for payment source

## 2019-04-26 VITALS — BP 183/115 | HR 69 | Wt 173.6 lb

## 2019-04-26 DIAGNOSIS — N644 Mastodynia: Secondary | ICD-10-CM

## 2019-04-26 DIAGNOSIS — I110 Hypertensive heart disease with heart failure: Secondary | ICD-10-CM

## 2019-04-26 DIAGNOSIS — E1142 Type 2 diabetes mellitus with diabetic polyneuropathy: Secondary | ICD-10-CM

## 2019-04-26 DIAGNOSIS — Z794 Long term (current) use of insulin: Secondary | ICD-10-CM

## 2019-04-26 DIAGNOSIS — E119 Type 2 diabetes mellitus without complications: Secondary | ICD-10-CM

## 2019-04-26 DIAGNOSIS — I5030 Unspecified diastolic (congestive) heart failure: Secondary | ICD-10-CM

## 2019-04-26 DIAGNOSIS — I10 Essential (primary) hypertension: Secondary | ICD-10-CM

## 2019-04-26 DIAGNOSIS — D259 Leiomyoma of uterus, unspecified: Secondary | ICD-10-CM

## 2019-04-26 MED FILL — KETOROLAC 10 MG TABLET: 10 | 5 days supply | Qty: 20 | Fill #0

## 2019-04-26 MED FILL — NAPROXEN 500 MG TABLET: 500 | 15 days supply | Qty: 30 | Fill #0

## 2019-04-26 NOTE — Progress Notes (Signed)
   Subjective:    Patient ID: Suzanne Cline, female    DOB: Apr 07, 1966, 53 y.o.   MRN: [id number]  HPI Patient G3P3 with vaginal deliveries referred here secondary to fibroid uterus. 10-11cm fibroid on [place] and CT with embolization around 2016 done at [facility]. Was told that she needed to have them removed. While most of her symptoms were pain during her menses, she continues to have some sharp pelvic pain about 2-3times a month, which lasts for about a minute or so. She hasn't had a menses since January.   Additionally, c/o breast pain, mostly on left breast. Has difficulty moving shoulder. Has abdominal pains, generalized body/muscle/joint pains. Is on gabapentin, which is helpful for her pain.  I have reviewed the patients past medical, family, and social history.  I have reviewed the patient's medication list and allergies.  Review of Systems     Objective:   Physical Exam Exam conducted with a chaperone present.  Constitutional:      Appearance: Normal appearance.  HENT:     Head: Normocephalic and atraumatic.  Cardiovascular:     Rate and Rhythm: Normal rate and regular rhythm.     Pulses: Normal pulses.  Pulmonary:     Effort: Pulmonary effort is normal.  Chest:     Chest wall: No mass, lacerations, deformity, swelling, tenderness, crepitus or edema. There is no dullness to percussion.     Breasts:        Right: Normal. No swelling, bleeding, inverted nipple, mass or nipple discharge.        Left: Normal. No swelling, bleeding, inverted nipple, mass or nipple discharge.     Comments: Diffuse tenderness of the entire left chest wall, breast, and axilla. Abdominal:     General: Abdomen is flat.     Palpations: Abdomen is soft.     Comments: Uterus: 20 week size, firm, somewhat mobile. Nontender.  Neurological:     Mental Status: She is alert.        Assessment & Plan:  1. Uterine leiomyoma, unspecified location Will update [place]. I discussed with her that  this would likely need to be an open hysterectomy due to its size and the calcified fibroid. I also discussed with her that she would need to be cleared by cardiology to have surgery. She would need to have her BP and her diabetes under control. I also discussed with her that as her symptoms from her fibroid uterus (rare, self limiting pain) may not be worth having surgery. However, she is quite insistent upon the surgery. Will have Gyn surgeon evaluate her. - [place] GYN Pelvis Complete; Future  2. Diabetic polyneuropathy associated with type 2 diabetes mellitus ([place]) Discussed management of diabetes is important for her polyneuropathy.  3. Type 2 diabetes mellitus without complication, with long-term current use of insulin ([place])  4. Hypertensive heart disease with diastolic heart failure ([place])  5. Breast pain Likely neuropathic in nature. Mammogram already done and r/o pathology. Continue gabapentin.   6. Essential hypertension Did not take today. Counseled patient that she needs to take this to control all of her symptoms.

## 2019-05-02 ENCOUNTER — Ambulatory Visit: Payer: No Typology Code available for payment source

## 2019-05-04 ENCOUNTER — Ambulatory Visit: Payer: Self-pay | Attending: Family Medicine | Admitting: Family Medicine

## 2019-05-04 ENCOUNTER — Encounter: Payer: Self-pay | Admitting: Family Medicine

## 2019-05-04 ENCOUNTER — Ambulatory Visit: Payer: Self-pay | Attending: Family Medicine

## 2019-05-04 ENCOUNTER — Ambulatory Visit (HOSPITAL_COMMUNITY)
Admission: RE | Admit: 2019-05-04 | Discharge: 2019-05-04 | Disposition: A | Discharge status: Home / Self Care | Payer: Self-pay | Source: Ambulatory Visit | Attending: Family Medicine | Admitting: Family Medicine

## 2019-05-04 ENCOUNTER — Other Ambulatory Visit: Payer: Self-pay

## 2019-05-04 VITALS — BP 159/98 | HR 86 | Temp 99.4°F | Ht 63.0 in | Wt 175.8 lb

## 2019-05-04 DIAGNOSIS — E1165 Type 2 diabetes mellitus with hyperglycemia: Secondary | ICD-10-CM

## 2019-05-04 DIAGNOSIS — E890 Postprocedural hypothyroidism: Secondary | ICD-10-CM

## 2019-05-04 DIAGNOSIS — I10 Essential (primary) hypertension: Secondary | ICD-10-CM

## 2019-05-04 DIAGNOSIS — R1011 Right upper quadrant pain: Secondary | ICD-10-CM

## 2019-05-04 DIAGNOSIS — K5900 Constipation, unspecified: Secondary | ICD-10-CM

## 2019-05-04 DIAGNOSIS — Z79899 Other long term (current) drug therapy: Secondary | ICD-10-CM

## 2019-05-04 DIAGNOSIS — E1142 Type 2 diabetes mellitus with diabetic polyneuropathy: Secondary | ICD-10-CM

## 2019-05-04 DIAGNOSIS — Z09 Encounter for follow-up examination after completed treatment for conditions other than malignant neoplasm: Secondary | ICD-10-CM

## 2019-05-04 DIAGNOSIS — R5383 Other fatigue: Secondary | ICD-10-CM

## 2019-05-04 DIAGNOSIS — D259 Leiomyoma of uterus, unspecified: Secondary | ICD-10-CM | POA: Insufficient documentation

## 2019-05-04 LAB — POCT GLYCOSYLATED HEMOGLOBIN (HGB A1C): Hemoglobin A1C: 6.6 % — AB (ref 4.0–5.6)

## 2019-05-04 LAB — GLUCOSE, POCT (MANUAL RESULT ENTRY): POC Glucose: 120 mg/dL — AB (ref 70–99)

## 2019-05-04 MED ORDER — POLYETHYLENE GLYCOL 3350 17 GM/SCOOP PO POWD: ORAL | 4 refills | Status: DC

## 2019-05-04 MED ORDER — INSULIN GLARGINE 100 UNITS/ML SOLOSTAR PEN: 20.0000 [IU] | PEN_INJECTOR | Freq: Every day | SUBCUTANEOUS | 4 refills | Status: DC

## 2019-05-04 MED ORDER — LOSARTAN POTASSIUM 50 MG PO TABS: 50.0000 mg | ORAL_TABLET | Freq: Every day | ORAL | 1 refills | Status: DC

## 2019-05-04 MED FILL — !LANTUS SOLOSTAR 100UNITS/M: 100 | 30 days supply | Qty: 6 | Fill #0

## 2019-05-04 MED FILL — POLYETHYLENE GLYCOL 3350 PO: 17 | 30 days supply | Qty: 476 | Fill #0

## 2019-05-04 NOTE — Progress Notes (Signed)
Pt. Is here for a follow up.  Pt. Is requesting to switch back to Lantus.

## 2019-05-04 NOTE — Progress Notes (Signed)
Established Patient Office Visit  Subjective:  Patient ID: Suzanne Cline, female    DOB: Nov 23, 1965  Age: 54 y.o. MRN: [id number]  CC:  Chief Complaint  Patient presents with  . Follow-up    HPI Suzanne Cline, 54 yo female who is seen in follow-up of  03/13/2019 telemedicine visit at [facility] in follow-up of left breast pain and at that time, she was awaiting mammogram and diagnostic mammogram scheduled for 03/30/2019. She also reported improved control of her diabetes since restarting the use of her insulin and had an upcoming cardiology appt in follow-up of her HTN and CHF.          Since her last visit she reports that her left breast pain has resolved. She also had appointment to establish with cardiology. She also had follow-up with GYN. She may need surgery to remove a uterine fibroid.  She is also s/p ED visit on 04/24/2019 due to right upper quadrant abdominal pain (which per ED notes, she has had off and on for more than a year). She reports that she continues to have right upper quadrant pain in addition to nausea but has some medicine left over from her emergency department visit. She feels as if she is having some issues with constipation. Right upper quadrant pain is recurrent and stabbing in nature. Pain can range from a 6-8 on a 0-to-10 scale. Pain comes and goes. She does not really feel that there is anything that she can do such as positioning or any medicines that she can take that help when she has episodes of right upper quadrant pain.         She does report improvement in her blood sugars on her current medications. She states that her blood sugars are generally always less than 120 fasting and she denies any significant issues with hypoglycemia. She has had no recent increased thirst or urinary frequency. She denies any blurred vision related to her blood sugars. She continues to have painful numbness and tingling in her feet and sometimes in the hands and this is  improved with the use of gabapentin. She reports that her blood pressure was elevated in the emergency department. She denies any current headaches or dizziness related to her blood pressure and she is taking her medications. She reports that she continues to feel very fatigued on a daily basis. She is taking her thyroid medication daily. She denies any increased shortness of breath or cough associated with her CHF. She has noticed no increase swelling in her legs and does not have any difficulty with shortness of breath when she lies down at night to sleep.  Past Medical History:  Diagnosis Date  . CHF (congestive heart failure) ([place])    per Report  . Diabetes mellitus without complication ([place])   . Hypertension     Past Surgical History:  Procedure Laterality Date  . ABLATION     uterus  . THYROIDECTOMY      Family History  Problem Relation Age of Onset  . Congestive Heart Failure Mother   . Breast cancer Neg Hx     Social History   Socioeconomic History  . Marital status: Single    Spouse name: Not on file  . Number of children: Not on file  . Years of education: Not on file  . Highest education level: Not on file  Occupational History  . Not on file  Tobacco Use  . Smoking status: Former Smoker  Packs/day: 0.50  . Smokeless tobacco: Never Used  Substance and Sexual Activity  . Alcohol use: Yes    Comment: social  . Drug use: Never  . Sexual activity: Not Currently  Other Topics Concern  . Not on file  Social History Narrative  . Not on file   Social Determinants of Health   Financial Resource Strain:   . Difficulty of Paying Living Expenses: Not on file  Food Insecurity:   . Worried About Charity fundraiser in the Last Year: Not on file  . Ran Out of Food in the Last Year: Not on file  Transportation Needs: Unmet Transportation Needs  . Lack of Transportation (Medical): Yes  . Lack of Transportation (Non-Medical): Yes  Physical Activity:   . Days of  Exercise per Week: Not on file  . Minutes of Exercise per Session: Not on file  Stress:   . Feeling of Stress : Not on file  Social Connections:   . Frequency of Communication with Friends and Family: Not on file  . Frequency of Social Gatherings with Friends and Family: Not on file  . Attends Religious Services: Not on file  . Active Member of Clubs or Organizations: Not on file  . Attends Archivist Meetings: Not on file  . Marital Status: Not on file  Intimate Partner Violence:   . Fear of Current or Ex-Partner: Not on file  . Emotionally Abused: Not on file  . Physically Abused: Not on file  . Sexually Abused: Not on file    Outpatient Medications Prior to Visit  Medication Sig Dispense Refill  . atorvastatin (LIPITOR) 40 MG tablet Take 40 mg by mouth daily.    . carvedilol (COREG) 25 MG tablet Take 25 mg by mouth 2 (two) times daily with a meal.    . furosemide (LASIX) 40 MG tablet Take 40 mg by mouth at bedtime.     . gabapentin (NEURONTIN) 300 MG capsule 2 pills (600mg ) twice per day and at bedtime for neuropathic pain (Patient taking differently: Take 600 mg by mouth 2 (two) times daily. ) 180 capsule 3  . glipiZIDE (GLUCOTROL) 10 MG tablet Take 10 mg by mouth 2 (two) times daily before a meal.    . Insulin Glargine (BASAGLAR KWIKPEN) 100 UNIT/ML SOPN Inject 25 Units into the skin at bedtime.     Marland Kitchen ketorolac (TORADOL) 10 MG tablet Take 1 tablet (10 mg total) by mouth every 6 (six) hours as needed. 20 tablet 0  . losartan (COZAAR) 25 MG tablet Take 25 mg by mouth daily.    . metFORMIN (GLUCOPHAGE) 1000 MG tablet Take 1,000 mg by mouth 2 (two) times daily with a meal.    . metroNIDAZOLE (FLAGYL) 500 MG tablet Take 1 tablet (500 mg total) by mouth 2 (two) times daily. 14 tablet 0  . spironolactone (ALDACTONE) 25 MG tablet Take 1 tablet (25 mg total) by mouth daily. 90 tablet 1  . SYNTHROID 150 MCG tablet Take 150 mcg by mouth daily.    . naproxen (NAPROSYN) 500 MG  tablet Take 1 tablet (500 mg total) by mouth 2 (two) times daily as needed. (Patient not taking: Reported on 05/04/2019) 30 tablet 0  . ondansetron (ZOFRAN ODT) 4 MG disintegrating tablet Take 1 tablet (4 mg total) by mouth every 8 (eight) hours as needed for nausea or vomiting. (Patient not taking: Reported on 04/26/2019) 20 tablet 0   No facility-administered medications prior to visit.    Allergies  Allergen Reactions  . Lisinopril Cough  . Oxycodone Itching    ROS Review of Systems  Constitutional: Positive for fatigue. Negative for chills and fever.  HENT: Negative for sore throat and trouble swallowing.   Eyes: Negative for photophobia and visual disturbance.  Respiratory: Negative for cough and shortness of breath.   Cardiovascular: Negative for chest pain, palpitations and leg swelling.  Gastrointestinal: Positive for abdominal pain, constipation and nausea. Negative for blood in stool and diarrhea.  Endocrine: Negative for polydipsia, polyphagia and polyuria.  Genitourinary: Negative for dysuria and frequency.  Musculoskeletal: Positive for arthralgias. Negative for back pain.  Neurological: Positive for numbness. Negative for dizziness and headaches.  Hematological: Negative for adenopathy. Does not bruise/bleed easily.  Psychiatric/Behavioral: Negative for self-injury and suicidal ideas. The patient is nervous/anxious.       Objective:    Physical Exam  Constitutional: She is oriented to person, place, and time. She appears well-developed and well-nourished.  Well-nourished well-developed overweight for height/obese older female in no acute distress. Patient seems more relaxed/less anxious than at her prior visit  Neck: No JVD present. No thyromegaly present.  Cardiovascular: Normal rate and regular rhythm.  No carotid bruit  Pulmonary/Chest: Effort normal and breath sounds normal.  No reproducible left chest wall/breast tenderness to palpation. No left axillary  adenopathy  Abdominal: Soft. She exhibits distension. There is abdominal tenderness. There is no rebound and no guarding.  Mild right upper quadrant and right mid to lower abdominal discomfort to palpation, no rebound or guarding  Musculoskeletal:        General: No tenderness or edema.     Cervical back: Normal range of motion and neck supple.  Lymphadenopathy:    She has no cervical adenopathy.  Neurological: She is alert and oriented to person, place, and time.  Skin: Skin is warm and dry.  No active skin breakdown on the feet  Psychiatric: She has a normal mood and affect. Her behavior is normal.  Nursing note and vitals reviewed.   BP (!) 148/101 (BP Location: Left Arm, Patient Position: Sitting, Cuff Size: Normal)   Pulse 86   Temp 99.4 F (37.4 C) (Oral)   Ht 5\' 3"  (1.6 m)   Wt 175 lb 12.8 oz (79.7 kg)   SpO2 95%   BMI 31.14 kg/m  Wt Readings from Last 3 Encounters:  05/04/19 175 lb 12.8 oz (79.7 kg)  04/26/19 173 lb 9.6 oz (78.7 kg)  04/24/19 178 lb (80.7 kg)     Health Maintenance Due  Topic Date Due  . PNEUMOCOCCAL POLYSACCHARIDE VACCINE AGE 23-64 HIGH RISK  02/22/1967  . OPHTHALMOLOGY EXAM  02/22/1975  . HIV Screening  02/22/1980  . TETANUS/TDAP  02/22/1984  . MAMMOGRAM  02/22/2015  . INFLUENZA VACCINE  11/26/2018  . COLON CANCER SCREENING ANNUAL FOBT  03/02/2019  . FOOT EXAM  03/03/2019    Lab Results  Component Value Date   TSH 1.820 02/16/2019   Lab Results  Component Value Date   WBC 7.8 04/24/2019   HGB 12.6 04/24/2019   HCT 39.0 04/24/2019   MCV 87.8 04/24/2019   PLT 241 04/24/2019   Lab Results  Component Value Date   NA 137 04/24/2019   K 4.0 04/24/2019   CO2 26 04/24/2019   GLUCOSE 78 04/24/2019   BUN 21 (H) 04/24/2019   CREATININE 1.00 04/24/2019   BILITOT 0.4 04/24/2019   ALKPHOS 49 04/24/2019   AST 18 04/24/2019   ALT 15 04/24/2019   PROT 7.3  04/24/2019   ALBUMIN 4.1 04/24/2019   CALCIUM 9.7 04/24/2019   ANIONGAP 11  04/24/2019   No results found for: CHOL No results found for: HDL No results found for: LDLCALC No results found for: TRIG No results found for: [facility] Lab Results  Component Value Date   HGBA1C 6.6 (A) 05/04/2019      Assessment & Plan:  1. Uncontrolled type 2 diabetes mellitus with hyperglycemia ([place]); 2. Diabetic neuropathy associated with type 2 Diabetes She reports improvement in her blood sugars since she restarted use of her insulin and BS today in office is 120 with Hgb A1c of 6.6 which is greatly improved from her A1c of 10.1 on 02/16/2019. She will continue her current regimen of medications for the treatment of diabetes including Lantus, metformin and glipizide. Continue use of neurontin for relief of pain associated with neuropathy.  - Glucose (CBG) - HgB A1c - Comprehensive metabolic panel - Lipase - Basic Metabolic Panel; Future  3. Postoperative hypothyroidism T4 and TSH to see if an adjustment is needed in patient's dose of thyroid replacement medication as she has postsurgical hypothyroidism s/p thyroidectomy as well as issues with fatigue and constipation. - T4 AND TSH  4. Fatigue, unspecified type Patient with complaint of ongoing issues with chronic fatigue- she will have CMET, Vitamin D level and repeat thyroid labs at today's visit - Comprehensive metabolic panel - Vitamin D, 25-hydroxy  5. Encounter for long-term (current) use of medications CMP in follow-up of long term use of medications for HTN, CHF and DM and future BMP in follow-up of change in BP medication and spironolactone use - Comprehensive metabolic panel - Basic Metabolic Panel; Future  6. Right upper quadrant abdominal pain; Encounter for examination following treatment at hospital Patient is s/p ED visit on 04/24/2019 due to her right upper quadrant pain and had abdominal US and CT of the abdomen and pelvis. Patient with mild fatty liver and possible reflux/esphagitis. Mild fatty liver but  gallbladder was normal. A large amount of stool was seen on CT. Patient will be treated for her constipation and will recheck CMET and lipase due to her continued pain. Will plan for future repeat of her RUQ [place] due to her recurrent pain and if no improvement then GI referral for further evaluation.  - Comprehensive metabolic panel - Lipase - US Abdomen Limited RUQ; Future  7. Constipation, unspecified constipation type Patient has had recent ED visit for right upper quadrant pain and moderate stool burden noted throughout the colon. Will recheck TSH and T4 as patient with hypothyroidism and insufficient thyroid replacement dose can contribute to constipation. RX provided for Miralax for patient to use daily as well as encouragement to increase daily water and fiber intake.  - T4 AND TSH - polyethylene glycol powder (GLYCOLAX/MIRALAX) 17 GM/SCOOP powder; Mix 17 gm/1 scoop of water with 16 or more oz per day of fluid  Dispense: 578 g; Refill: 4  8. Essential hypertension BP remains uncontrolled which patient thinks may be related to pain. Will have patient follow-up in a few weeks with the clinical pharmacist for recheck of BP and recommendations as well as for follow-up of her DM. She should also notify her cardiologist regarding her continued elevated BP. Low sodium diet encouraged. Losartan 25mg  increased to 50 mg and may need to be increased again at follow-up with clinical pharmacist.  - Amb Referral to Clinical Pharmacist - Basic Metabolic Panel; Future   An After Visit Summary was printed and given to  the patient.  Follow-up: Return in about 4 months (around 09/01/2019) for DM/chronic issues- sooner if needed; 3-4 weeks with Lurena Joiner.    Antony Blackbird, MD

## 2019-05-05 LAB — COMPREHENSIVE METABOLIC PANEL WITH GFR
ALT: 8 IU/L (ref 0–32)
AST: 13 IU/L (ref 0–40)
Albumin/Globulin Ratio: 1.4 (ref 1.2–2.2)
Albumin: 4.2 g/dL (ref 3.8–4.9)
Alkaline Phosphatase: 59 IU/L (ref 39–117)
BUN/Creatinine Ratio: 15 (ref 9–23)
BUN: 13 mg/dL (ref 6–24)
Bilirubin Total: 0.3 mg/dL (ref 0.0–1.2)
CO2: 28 mmol/L (ref 20–29)
Calcium: 9.7 mg/dL (ref 8.7–10.2)
Chloride: 100 mmol/L (ref 96–106)
Creatinine, Ser: 0.86 mg/dL (ref 0.57–1.00)
GFR calc Af Amer: 89 mL/min/1.73
GFR calc non Af Amer: 77 mL/min/1.73
Globulin, Total: 2.9 g/dL (ref 1.5–4.5)
Glucose: 120 mg/dL — ABNORMAL HIGH (ref 65–99)
Potassium: 3.9 mmol/L (ref 3.5–5.2)
Sodium: 143 mmol/L (ref 134–144)
Total Protein: 7.1 g/dL (ref 6.0–8.5)

## 2019-05-05 LAB — T4 AND TSH
T4, Total: 9.7 ug/dL (ref 4.5–12.0)
TSH: 0.457 u[IU]/mL (ref 0.450–4.500)

## 2019-05-05 LAB — LIPASE: Lipase: 36 U/L (ref 14–72)

## 2019-05-05 LAB — VITAMIN D 25 HYDROXY (VIT D DEFICIENCY, FRACTURES): Vit D, 25-Hydroxy: 41.7 ng/mL (ref 30.0–100.0)

## 2019-05-08 ENCOUNTER — Telehealth: Payer: Self-pay | Admitting: *Deleted

## 2019-05-08 NOTE — Telephone Encounter (Signed)
-----   Message from Antony Blackbird, MD sent at 05/08/2019 10:03 AM EST ----- Normal electrolytes and normal liver enzymes on comprehensive metabolic panel with exception of glucose of 120.  Normal thyroid blood test.  Normal lipase, pancreatic enzyme.  Normal vitamin D level.

## 2019-05-08 NOTE — Telephone Encounter (Signed)
Patient verified DOB Patient is aware of labs being normal and to limit sugar intake to lower glucose level in the blood.

## 2019-05-16 ENCOUNTER — Encounter: Payer: Self-pay | Admitting: *Deleted

## 2019-05-25 ENCOUNTER — Ambulatory Visit: Payer: Self-pay | Admitting: Obstetrics and Gynecology

## 2019-06-06 MED FILL — GABAPENTIN 300 MG CAPSULE: 300 | 30 days supply | Qty: 180 | Fill #2

## 2019-06-07 ENCOUNTER — Other Ambulatory Visit: Payer: Self-pay

## 2019-06-07 ENCOUNTER — Other Ambulatory Visit: Payer: Self-pay | Admitting: Family Medicine

## 2019-06-07 ENCOUNTER — Ambulatory Visit (INDEPENDENT_AMBULATORY_CARE_PROVIDER_SITE_OTHER): Payer: Self-pay | Admitting: Cardiology

## 2019-06-07 ENCOUNTER — Ambulatory Visit: Payer: Self-pay | Attending: Family Medicine | Admitting: Pharmacist

## 2019-06-07 ENCOUNTER — Encounter: Payer: Self-pay | Admitting: Cardiology

## 2019-06-07 VITALS — BP 158/102 | HR 70 | Ht 63.0 in | Wt 171.6 lb

## 2019-06-07 DIAGNOSIS — E785 Hyperlipidemia, unspecified: Secondary | ICD-10-CM

## 2019-06-07 DIAGNOSIS — Z01810 Encounter for preprocedural cardiovascular examination: Secondary | ICD-10-CM

## 2019-06-07 DIAGNOSIS — E1169 Type 2 diabetes mellitus with other specified complication: Secondary | ICD-10-CM

## 2019-06-07 DIAGNOSIS — R1011 Right upper quadrant pain: Secondary | ICD-10-CM

## 2019-06-07 DIAGNOSIS — I110 Hypertensive heart disease with heart failure: Secondary | ICD-10-CM

## 2019-06-07 DIAGNOSIS — I10 Essential (primary) hypertension: Secondary | ICD-10-CM

## 2019-06-07 DIAGNOSIS — I5030 Unspecified diastolic (congestive) heart failure: Secondary | ICD-10-CM

## 2019-06-07 MED ORDER — LOSARTAN POTASSIUM 100 MG PO TABS: 100.0000 mg | ORAL_TABLET | Freq: Every day | ORAL | 3 refills | Status: DC

## 2019-06-07 NOTE — Patient Instructions (Signed)
Medication Instructions:   start taking losartan ( cozaar)  *If you need a refill on your cardiac medications before your next appointment, please call your pharmacy*  Lab Work: Not needed.  Testing/Procedures: not needed  Follow-Up: At [facility], you and your health needs are our priority.  As part of our continuing mission to provide you with exceptional heart care, we have created designated Provider Care Teams.  These Care Teams include your primary Cardiologist (physician) and Advanced Practice Providers (APPs -  Physician Assistants and Nurse Practitioners) who all work together to provide you with the care you need, when you need it.  Your next appointment:   4 to 5  month(s)  The format for your next appointment:   In Person  Provider:   Glenetta Hew, MD  Other Instructions n/a

## 2019-06-07 NOTE — Progress Notes (Signed)
Patient ID: Suzanne Cline, female   DOB: Dec 31, 1965, 54 y.o.   MRN: [id number]   Per RN, patient presented to the office today and was tearful because she thought that today's appointment was to have a right upper quadrant ultrasound and not to follow-up with the clinical pharmacist regarding her uncontrolled diabetes.  RN requested stat order for right upper quadrant ultrasound which was just placed

## 2019-06-07 NOTE — Progress Notes (Signed)
Primary Care Provider: Antony Blackbird, MD Cardiologist: No primary care provider on file. Electrophysiologist: None  Clinic Note: Chief Complaint  Patient presents with  . Follow-up    No major complaints  . Hypertension    Poorly controlled  . Pre-op Exam    Likely has fibroid surgery coming up    HPI:    Suzanne Cline is a 54 y.o. female with a PMH below who presents toda with y for 39-month follow-up for hypertensive heart disease.  Suzanne Cline was last seen on November 16, 2033fFor reevaluation for chest pain which in this case was really associated with tender painful discomfort under her left breast which was likely a site of cellulitis or some other type of infection.  She was noted to be quite hypertensive, but was also under significant amount of pain.  --> It would appear that she has been followed by [facility] dating back to 2017.  Presumably this was for some type of CHF.  She had been seen at [facility] in [place] for an episode of perioperative nonsustained V. Tach. ->  Unfortunately, we had sent off for her Myoview and potentially with echocardiogram but we have yet to receive these documents.  Recent Hospitalizations:   ER visit on April 24, 2019 -> Presented with abdominal pain-right side sharp, stabbing ripping pain.  Symptoms thought to potentially related to uterine fibroid  Reviewed  CV studies:    The following studies were reviewed today: (if available, images/films reviewed: From Epic Chart or Care Everywhere) . Unfortunately, no studies yet available to review.:   Interval History:   Suzanne Cline returns here today noting that her left-sided breast pain has significantly improved.  She apparently is needing to have surgery for her uterine fibroid removal. Suzanne Cline denies having any resting or exertional chest pain, but may have some mild exertional dyspnea, likely related to deconditioning.  No real PND, orthopnea or  edema.  She is not taking her Lasix twice daily as originally scheduled.  She actually does not take it every day.  She denies any sensation of rapid irregular heartbeats or palpitations.    CV Review of Symptoms (Summary): positive for - dyspnea on exertion and This is not very profound negative for - chest pain, edema, irregular heartbeat, orthopnea, palpitations, paroxysmal nocturnal dyspnea, rapid heart rate, shortness of breath or No syncope or near syncope; TIA/amaurosis fugax, claudication  The patient does not have symptoms concerning for COVID-19 infection (fever, chills, cough, or new shortness of breath).  The patient is practicing social distancing & Masking.    REVIEWED OF SYSTEMS   A comprehensive ROS was performed. Review of Systems  Constitutional: Negative for malaise/fatigue.  HENT: Negative for nosebleeds.   Respiratory: Negative for shortness of breath.   Cardiovascular: Negative for chest pain (Her left lateral breast pain is notably improved since I last saw her).  Gastrointestinal: Positive for abdominal pain. Negative for blood in stool and melena.       Intermittent loose stools  Genitourinary: Negative for hematuria.  Musculoskeletal: Negative for joint pain.  Neurological: Negative for dizziness.  Psychiatric/Behavioral: Negative for memory loss. The patient is not nervous/anxious and does not have insomnia.     I have reviewed and (if needed) personally updated the patient's problem list, medications, allergies, past medical and surgical history, social and family history.   PAST MEDICAL HISTORY   Past Medical History:  Diagnosis Date  . CHF (congestive heart failure) ([place])  per Report  . Diabetes mellitus without complication ([place])   . Hypertension     PAST SURGICAL HISTORY   Past Surgical History:  Procedure Laterality Date  . ABLATION     uterus  . THYROIDECTOMY      MEDICATIONS/ALLERGIES   Current Meds  Medication Sig  .  atorvastatin (LIPITOR) 40 MG tablet Take 40 mg by mouth daily.  . carvedilol (COREG) 25 MG tablet Take 25 mg by mouth 2 (two) times daily with a meal.  . furosemide (LASIX) 40 MG tablet Take 40 mg by mouth at bedtime.   . gabapentin (NEURONTIN) 300 MG capsule 2 pills (600mg ) twice per day and at bedtime for neuropathic pain (Patient taking differently: Take 600 mg by mouth 2 (two) times daily. )  . glipiZIDE (GLUCOTROL) 10 MG tablet Take 10 mg by mouth 2 (two) times daily before a meal.  . insulin glargine (LANTUS) 100 unit/mL SOPN Inject 0.2 mLs (20 Units total) into the skin daily.  Marland Kitchen ketorolac (TORADOL) 10 MG tablet Take 1 tablet (10 mg total) by mouth every 6 (six) hours as needed.  . metFORMIN (GLUCOPHAGE) 1000 MG tablet Take 1,000 mg by mouth 2 (two) times daily with a meal.  . metroNIDAZOLE (FLAGYL) 500 MG tablet Take 1 tablet (500 mg total) by mouth 2 (two) times daily.  . naproxen (NAPROSYN) 500 MG tablet Take 1 tablet (500 mg total) by mouth 2 (two) times daily as needed.  . ondansetron (ZOFRAN ODT) 4 MG disintegrating tablet Take 1 tablet (4 mg total) by mouth every 8 (eight) hours as needed for nausea or vomiting.  . polyethylene glycol powder (GLYCOLAX/MIRALAX) 17 GM/SCOOP powder Mix 17 gm/1 scoop of water with 16 or more oz per day of fluid  . spironolactone (ALDACTONE) 25 MG tablet Take 1 tablet (25 mg total) by mouth daily.  Marland Kitchen SYNTHROID 150 MCG tablet Take 150 mcg by mouth daily.  . [DISCONTINUED] losartan (COZAAR) 50 MG tablet Take 1 tablet (50 mg total) by mouth daily. To lower blood pressure    Allergies  Allergen Reactions  . Lisinopril Cough  . Oxycodone Itching    SOCIAL HISTORY/FAMILY HISTORY   Social History   Tobacco Use  . Smoking status: Former Smoker    Packs/day: 0.50  . Smokeless tobacco: Never Used  Substance Use Topics  . Alcohol use: Yes    Comment: social  . Drug use: Never   Social History   Social History Narrative  . Not on file    Family  History family history includes Congestive Heart Failure in her mother.  OBJCTIVE -PE, EKG, labs   Wt Readings from Last 3 Encounters:  06/07/19 171 lb 9.6 oz (77.8 kg)  05/04/19 175 lb 12.8 oz (79.7 kg)  04/26/19 173 lb 9.6 oz (78.7 kg)    Physical Exam: BP (!) 158/102   Pulse 70   Ht 5\' 3"  (1.6 m)   Wt 171 lb 9.6 oz (77.8 kg)   SpO2 97%   BMI 30.40 kg/m  Physical Exam  Constitutional: She is oriented to person, place, and time. She appears well-developed and well-nourished. No distress.  Still a bit uncomfortable appearing, but notably better than last visit  HENT:  Head: Normocephalic and atraumatic.  Neck: No JVD present.  Cardiovascular: Normal rate, regular rhythm, normal heart sounds and intact distal pulses. Exam reveals no gallop and no friction rub.  No murmur heard. Pulmonary/Chest: Effort normal. No respiratory distress. She has no wheezes. She has no  rales.  Musculoskeletal:        General: No edema. Normal range of motion.  Neurological: She is alert and oriented to person, place, and time.  Psychiatric: She has a normal mood and affect. Her behavior is normal. Judgment and thought content normal.  Vitals reviewed.     Adult ECG Report Not checked  Recent Labs:   Lipids from September 2019: TC 192, TG 130, HDL 46, LDL 120 No results found for: CHOL, HDL, LDLCALC, LDLDIRECT, TRIG, CHOLHDL Lab Results  Component Value Date   CREATININE 0.86 05/04/2019   BUN 13 05/04/2019   NA 143 05/04/2019   K 3.9 05/04/2019   CL 100 05/04/2019   CO2 28 05/04/2019   Lab Results  Component Value Date   TSH 0.457 05/04/2019    ASSESSMENT/PLAN    Problem List Items Addressed This Visit    Hypertensive heart disease with diastolic heart failure (HCC) (Chronic)    Presumable diagnosis of hypertensive heart disease with some diastolic heart failure.  She has been on medication such as spironolactone and Lasix which was argue that this is what was being treated.   Unfortunately, I still have results from [facility] clinic.  I have to presume, however that with the fact that she did not have heart catheterization, that the stress test from 2017 was not ischemic in nature.  She still somewhat hypertensive today.  She is on 25 mg Coreg as well as 50 mg losartan along with 25 mg spironolactone.  She is on a standing dose of Lasix 40 mg daily which she is not taking every day.  In fact she is not having to take any extra doses.    If I am not able to get her results from [facility] clinic, we will probably want to get a baseline echocardiogram here, however I do not think this plays any bearing on her upcoming surgery as she clearly does not have any active heart failure symptoms.  Plan: Continue current medications with the exception of increasing losartan to 100 mg daily. ->  We have the option of going forward of increasing spironolactone as well as potentially adding amlodipine.      Relevant Medications   losartan (COZAAR) 100 MG tablet   Hyperlipidemia associated with type 2 diabetes mellitus ([place]) (Chronic)    She is on 40 mg of atorvastatin.  Unfortunately we do not have any recent lipids to review.  These are presumably being followed by her PCP.  The last labs I have available are from 2019. We will defer management to her PCP at this time, but will try to obtain records of lab values.      Relevant Medications   losartan (COZAAR) 100 MG tablet   Preop cardiovascular exam    She has a potential uterine surgery whether is hysterectomy or D&C in the future for her significant uterine fibroid.  Unless the surgery is performed via open abdominal hysterectomy, the surgery itself would be considered low to intermediate risk from a cardiac standpoint.  She has no active symptoms of anginal chest pain or heart failure with any PND, orthopnea or edema.  She is diabetic, on insulin, but has normal renal function and no history of stroke.  PREOPERATIVE CARDIAC RISK  ASSESSMENT   Revised Cardiac Risk Index:  High Risk Surgery: no; unless intraperitoneal  Defined as Intraperitoneal, intrathoracic or suprainguinal vascular  Active CAD: no;   CHF: no; despite having diagnosis of diastolic heart failure, she is not symptomatic  Cerebrovascular Disease: no;   Diabetes: yes; On Insulin: yes  CKD (Cr >~ 2): no; creatinine 0.86  Total: 1 Estimated Risk of Adverse Outcome: LOW Estimated Risk of MI, PE, VF/VT (Cardiac Arrest), Complete Heart Block: 1-3% based on her having diabetes, which is not a risk factor that can be altered.  She is on high-dose carvedilol which does provide cardiovascular benefit. .  ACC/AHA Guidelines for "Clearance":  Step 1 - Need for Emergency Surgery: No:   If Yes - go straight to OR with perioperative surveillance  Step 2 - Active Cardiac Conditions (Unstable Angina, Decompensated HF, Significant  Arrhytmias - Complete HB, Mobitz II, Symptomatic VT or SVT, Severe Aortic Stenosis - mean gradient > 40 mmHg, Valve area < 1.0 cm2):   No: No active symptoms.  No decompensated heart failure unstable angina.  No arrhythmias.  If Yes - Evaluate & Treat per ACC/AHA Guidelines  Step 3 -  Low Risk Surgery: No: Potentially intermediate  If Yes --> proceed to OR  If No --> Step 4  Step 4 - Functional Capacity >= 4 METS without symptoms: Yes  If Yes --> proceed to OR  If No --> Step 5  Step 5 --  Clinical Risk Factors (CRF)   1-2 or more CRFs: Yes -diabetes  If Yes -- assess Surgical Risk, --   (High Risk Non-cardiac), Intraabdominal or thoracic vascular surgery --> Proceed to OR, or consider testing if it will change management.  Intermediate Risk: Proceed to OR with HR control, or consider testing if it will change management  Based on current guidelines, the recommendation would be to proceed with planned surgery without any additional cardiology evaluation.  We are we will continue to try to obtain her outside  records, however for the current evaluation, they are not likely to be relevant.  These would simply be for baseline assessment.  No indication to check an echocardiogram or stress test the absence of any symptoms.            COVID-19 Education: The signs and symptoms of COVID-19 were discussed with the patient and how to seek care for testing (follow up with PCP or arrange E-visit).   The importance of social distancing was discussed today.  I spent a total of 18 minutes with the patient. >  50% of the time was spent in direct patient consultation.  Additional time spent with chart review  / charting (studies, outside notes, etc): 6 Total Time: 24 min   Current medicines are reviewed at length with the patient today.  (+/- concerns) n/a   Patient Instructions / Medication Changes & Studies & Tests Ordered   Patient Instructions  Medication Instructions:   start taking losartan ( cozaar)  *If you need a refill on your cardiac medications before your next appointment, please call your pharmacy*  Lab Work: Not needed.  Testing/Procedures: not needed  Follow-Up: At [facility], you and your health needs are our priority.  As part of our continuing mission to provide you with exceptional heart care, we have created designated Provider Care Teams.  These Care Teams include your primary Cardiologist (physician) and Advanced Practice Providers (APPs -  Physician Assistants and Nurse Practitioners) who all work together to provide you with the care you need, when you need it.  Your next appointment:   4 to 5  month(s)  The format for your next appointment:   In Person  Provider:   Glenetta Hew, MD  Other Instructions n/a  Studies Ordered:   No orders of the defined types were placed in this encounter.    Glenetta Hew, M.D., M.S. Interventional Cardiologist   Pager # [phone] Phone # [phone] [address]. [place], [place]  [address]   Thank you for choosing Heartcare at [facility]!!

## 2019-06-07 NOTE — Progress Notes (Signed)
Patient was to be seen for me today for BP check but thought she was being seen for an [place]. We have scheduled her for an [place] at [place] at [employer] tomorrow morning (06/08/19). She was encouraged to reach out to me after this is resolved for BP/DM management.

## 2019-06-08 ENCOUNTER — Ambulatory Visit (HOSPITAL_COMMUNITY)
Admission: RE | Admit: 2019-06-08 | Discharge: 2019-06-08 | Disposition: A | Discharge status: Home / Self Care | Payer: Self-pay | Source: Ambulatory Visit | Attending: Family Medicine | Admitting: Family Medicine

## 2019-06-08 DIAGNOSIS — R1011 Right upper quadrant pain: Secondary | ICD-10-CM | POA: Insufficient documentation

## 2019-06-09 ENCOUNTER — Telehealth (INDEPENDENT_AMBULATORY_CARE_PROVIDER_SITE_OTHER): Payer: Self-pay

## 2019-06-09 NOTE — Telephone Encounter (Signed)
-----   Message from Antony Blackbird, MD sent at 06/08/2019  4:49 PM EST ----- Normal right upper quadrant ultrasound- normal liver and gallbladder

## 2019-06-09 NOTE — Telephone Encounter (Signed)
Patient verified date of birth. She is aware that ultrasound was normal. Liver and gallbladder normal. She had several questions about the origin of her pain if not coming from the liver or gallbladder. She also mentioned that the ultrasound tech took imaging of her left side as well and informed her she had several gas pockets. She wanted to know what she could take to expel the gas. Advised patient to contact the clinic and schedule appt with PCP to discuss. She verbalized understanding. Nat Christen, CMA

## 2019-06-10 ENCOUNTER — Encounter: Payer: Self-pay | Admitting: Cardiology

## 2019-06-10 ENCOUNTER — Other Ambulatory Visit: Payer: Self-pay | Admitting: Family Medicine

## 2019-06-10 ENCOUNTER — Encounter: Payer: Self-pay | Admitting: Family Medicine

## 2019-06-10 DIAGNOSIS — R1011 Right upper quadrant pain: Secondary | ICD-10-CM

## 2019-06-10 DIAGNOSIS — E785 Hyperlipidemia, unspecified: Secondary | ICD-10-CM | POA: Insufficient documentation

## 2019-06-10 DIAGNOSIS — K2100 Gastro-esophageal reflux disease with esophagitis, without bleeding: Secondary | ICD-10-CM

## 2019-06-10 DIAGNOSIS — E1169 Type 2 diabetes mellitus with other specified complication: Secondary | ICD-10-CM | POA: Insufficient documentation

## 2019-06-10 DIAGNOSIS — Z01810 Encounter for preprocedural cardiovascular examination: Secondary | ICD-10-CM | POA: Insufficient documentation

## 2019-06-10 DIAGNOSIS — K760 Fatty (change of) liver, not elsewhere classified: Secondary | ICD-10-CM

## 2019-06-10 DIAGNOSIS — G8929 Other chronic pain: Secondary | ICD-10-CM

## 2019-06-10 DIAGNOSIS — K5900 Constipation, unspecified: Secondary | ICD-10-CM

## 2019-06-10 MED ORDER — OMEPRAZOLE 40 MG PO CPDR: 40.0000 mg | DELAYED_RELEASE_CAPSULE | Freq: Two times a day (BID) | ORAL | 3 refills | Status: DC

## 2019-06-10 NOTE — Assessment & Plan Note (Signed)
She is on 40 mg of atorvastatin.  Unfortunately we do not have any recent lipids to review.  These are presumably being followed by her PCP.  The last labs I have available are from 2019. We will defer management to her PCP at this time, but will try to obtain records of lab values.

## 2019-06-10 NOTE — Progress Notes (Unsigned)
Patient ID: Suzanne Cline, female   DOB: [birth year], 54 y.o.   MRN: [id number]   Patient was in the office earlier this week to meet with clinical pharmacist for blood pressure recheck/follow-up of diabetes. RN reported that patient was upset and crying as she thought that her visit was to have a right upper quadrant ultrasound and follow-up of her right upper quadrant pain and that she could not take additional days off from work for additional testing. Stat right upper quadrant ultrasound was ordered per request of patient and right upper quadrant ultrasound did not reveal any cause for her recurrent right upper quadrant pain. She did have right upper quadrant ultrasound as well as CT of the abdomen and pelvis during the emergency department visit 04/24/2019 which showed mild fatty liver, evidence of possible reflux versus esophagitis and moderate stool burden throughout the colon. She also has known large uterine fibroid for which she is to have upcoming surgery per GYN. Patient has been prescribed MiraLAX at a prior visit for treatment of constipation. She will be contacted and asked to start PPI therapy, likely omeprazole 40 mg twice daily for 8 weeks and will place a GI referral in follow-up of her mild fatty liver, right upper quadrant pain and possible esophagitis seen on CT scan.

## 2019-06-10 NOTE — Assessment & Plan Note (Signed)
Presumable diagnosis of hypertensive heart disease with some diastolic heart failure.  She has been on medication such as spironolactone and Lasix which was argue that this is what was being treated.  Unfortunately, I still have results from [facility] clinic.  I have to presume, however that with the fact that she did not have heart catheterization, that the stress test from 2017 was not ischemic in nature.  She still somewhat hypertensive today.  She is on 25 mg Coreg as well as 50 mg losartan along with 25 mg spironolactone.  She is on a standing dose of Lasix 40 mg daily which she is not taking every day.  In fact she is not having to take any extra doses.    If I am not able to get her results from [facility] clinic, we will probably want to get a baseline echocardiogram here, however I do not think this plays any bearing on her upcoming surgery as she clearly does not have any active heart failure symptoms.  Plan: Continue current medications with the exception of increasing losartan to 100 mg daily. ->  We have the option of going forward of increasing spironolactone as well as potentially adding amlodipine.

## 2019-06-10 NOTE — Assessment & Plan Note (Signed)
She has a potential uterine surgery whether is hysterectomy or D&C in the future for her significant uterine fibroid.  Unless the surgery is performed via open abdominal hysterectomy, the surgery itself would be considered low to intermediate risk from a cardiac standpoint.  She has no active symptoms of anginal chest pain or heart failure with any PND, orthopnea or edema.  She is diabetic, on insulin, but has normal renal function and no history of stroke.  PREOPERATIVE CARDIAC RISK ASSESSMENT   Revised Cardiac Risk Index:  High Risk Surgery: no; unless intraperitoneal  Defined as Intraperitoneal, intrathoracic or suprainguinal vascular  Active CAD: no;   CHF: no; despite having diagnosis of diastolic heart failure, she is not symptomatic  Cerebrovascular Disease: no;   Diabetes: yes; On Insulin: yes  CKD (Cr >~ 2): no; creatinine 0.86  Total: 1 Estimated Risk of Adverse Outcome: LOW Estimated Risk of MI, PE, VF/VT (Cardiac Arrest), Complete Heart Block: 1-3% based on her having diabetes, which is not a risk factor that can be altered.  She is on high-dose carvedilol which does provide cardiovascular benefit. .  ACC/AHA Guidelines for "Clearance":  Step 1 - Need for Emergency Surgery: No:   If Yes - go straight to OR with perioperative surveillance  Step 2 - Active Cardiac Conditions (Unstable Angina, Decompensated HF, Significant  Arrhytmias - Complete HB, Mobitz II, Symptomatic VT or SVT, Severe Aortic Stenosis - mean gradient > 40 mmHg, Valve area < 1.0 cm2):   No: No active symptoms.  No decompensated heart failure unstable angina.  No arrhythmias.  If Yes - Evaluate & Treat per ACC/AHA Guidelines  Step 3 -  Low Risk Surgery: No: Potentially intermediate  If Yes --> proceed to OR  If No --> Step 4  Step 4 - Functional Capacity >= 4 METS without symptoms: Yes  If Yes --> proceed to OR  If No --> Step 5  Step 5 --  Clinical Risk Factors (CRF)   1-2 or more  CRFs: Yes -diabetes  If Yes -- assess Surgical Risk, --   (High Risk Non-cardiac), Intraabdominal or thoracic vascular surgery --> Proceed to OR, or consider testing if it will change management.  Intermediate Risk: Proceed to OR with HR control, or consider testing if it will change management  Based on current guidelines, the recommendation would be to proceed with planned surgery without any additional cardiology evaluation.  We are we will continue to try to obtain her outside records, however for the current evaluation, they are not likely to be relevant.  These would simply be for baseline assessment.  No indication to check an echocardiogram or stress test the absence of any symptoms.

## 2019-06-12 MED FILL — OMEPRAZOLE DR 40 MG CAPSULE: 40 | 30 days supply | Qty: 60 | Fill #0

## 2019-06-15 ENCOUNTER — Ambulatory Visit: Payer: Self-pay | Admitting: Gastroenterology

## 2019-06-21 ENCOUNTER — Ambulatory Visit: Payer: Self-pay | Admitting: Gastroenterology

## 2019-06-26 ENCOUNTER — Encounter: Payer: Self-pay | Admitting: Cardiology

## 2019-06-27 ENCOUNTER — Other Ambulatory Visit: Payer: Self-pay

## 2019-06-27 ENCOUNTER — Encounter: Payer: Self-pay | Admitting: Gastroenterology

## 2019-06-27 ENCOUNTER — Ambulatory Visit (INDEPENDENT_AMBULATORY_CARE_PROVIDER_SITE_OTHER): Payer: Self-pay | Admitting: Gastroenterology

## 2019-06-27 VITALS — BP 124/76 | HR 64 | Temp 98.2°F | Ht 63.0 in | Wt 167.1 lb

## 2019-06-27 DIAGNOSIS — R1012 Left upper quadrant pain: Secondary | ICD-10-CM

## 2019-06-27 DIAGNOSIS — Z8639 Personal history of other endocrine, nutritional and metabolic disease: Secondary | ICD-10-CM

## 2019-06-27 DIAGNOSIS — K5909 Other constipation: Secondary | ICD-10-CM

## 2019-06-27 NOTE — Patient Instructions (Signed)
If you are age 55 or older, your body mass index should be between 23-30. Your Body mass index is 29.6 kg/m. If this is out of the aforementioned range listed, please consider follow up with your Primary Care Provider.  If you are age 62 or younger, your body mass index should be between 19-25. Your Body mass index is 29.6 kg/m. If this is out of the aformentioned range listed, please consider follow up with your Primary Care Provider.   You have been scheduled for an endoscopy. Please follow written instructions given to you at your visit today. If you use inhalers (even only as needed), please bring them with you on the day of your procedure. Your physician has requested that you go to www.startemmi.com and enter the access code given to you at your visit today. This web site gives a general overview about your procedure. However, you should still follow specific instructions given to you by our office regarding your preparation for the procedure.  We are requesting records from [place].  Thank you for choosing me and [facility].   Alonza Bogus, PA-C

## 2019-06-27 NOTE — Progress Notes (Signed)
06/27/2019 Suzanne Cline [id number] 03-12-1966   HISTORY OF PRESENT ILLNESS:  This is a 54 year old female with PMH listed below who is new to our practice and has been referred here by her PCP, Dr. Chapman Fitch, for evaluation regarding issues with bloating and abdominal pain.  The patient tells me that she has been experiencing LUQ abdominal pain for the past couple of months.  She describes it as a numbness and stabbing type pain, but has not noticed a rash.  Also describes bloating/tightening sensation in that area.  Pain is constant.  Does not feel frequent heartburn or reflux.  Has chronic constipation and uses Miralax for that.  Had colonoscopy in [place] about 2 years ago so we are going to try to obtain those records.  She had an abdominal ultrasound performed in December 2020 for complaints of right upper quadrant abdominal pain that was normal.  The same day she also had a CT scan of the abdomen pelvis with contrast performed that showed the following:  IMPRESSION: 1. Minimal distal esophageal wall thickening, can be seen with reflux or esophagitis. 2. No other acute abnormality in the abdomen/pelvis. Particularly, normal appendix. 3. Mild colonic diverticulosis without diverticulitis. 4. Peripherally calcified 11 cm mass in the pelvis is presumably a calcified uterine fibroid, not significantly changed from exam more than 1 year ago.  Then another right upper quadrant abdominal ultrasound on February 11 was normal as well.  Lipase, TSH, CBC, CMP have all been normal.  She was placed on omeprazole 40 mg BID fairly recently by her PCP without improvement thus far.   Past Medical History:  Diagnosis Date  . Diabetes mellitus without complication ([place])   . Fibroids, intramural    s/p uterine artery embolization  . Former cigarette smoker 04/2011  . History of Resolved Nonischemic Congestive Cardiomyopathy ([place]) 04/2011   Echo March 2013: EF 35% with mild/moderate diastolic  dysfunction -> Follow-Up Echo March 2014 EF 55% with G1 DD.  Marland Kitchen Hypertension   . OSA (obstructive sleep apnea)    Past Surgical History:  Procedure Laterality Date  . ABLATION     uterus  . LEFT HEART CATH AND CORONARY ANGIOGRAPHY  05/19/2011   ([facility]): Normal coronary arteries  . THYROIDECTOMY    . TRANSTHORACIC ECHOCARDIOGRAM  07/18/2011   [facility] [place]: EF 35% with mild to moderate DD. -->  . TRANSTHORACIC ECHOCARDIOGRAM     [facility] [place]: EF 55%, G1 DD.  LVIDd 4.3; July 12, 2015: EF 55 to 60%.  Normal valves    reports that she has quit smoking. She smoked 0.50 packs per day. She has never used smokeless tobacco. She reports current alcohol use. She reports that she does not use drugs. family history includes Congestive Heart Failure in her mother. Allergies  Allergen Reactions  . Lisinopril Cough  . Oxycodone Itching      Outpatient Encounter Medications as of 06/27/2019  Medication Sig  . atorvastatin (LIPITOR) 40 MG tablet Take 40 mg by mouth daily.  . carvedilol (COREG) 25 MG tablet Take 25 mg by mouth 2 (two) times daily with a meal.  . furosemide (LASIX) 40 MG tablet Take 40 mg by mouth at bedtime.   . gabapentin (NEURONTIN) 300 MG capsule 2 pills (600mg ) twice per day and at bedtime for neuropathic pain (Patient taking differently: Take 600 mg by mouth 2 (two) times daily. )  . glipiZIDE (GLUCOTROL) 10 MG tablet Take 10 mg by mouth 2 (two)  times daily before a meal.  . insulin glargine (LANTUS) 100 unit/mL SOPN Inject 0.2 mLs (20 Units total) into the skin daily.  Marland Kitchen losartan (COZAAR) 100 MG tablet Take 1 tablet (100 mg total) by mouth daily.  . metFORMIN (GLUCOPHAGE) 1000 MG tablet Take 1,000 mg by mouth 2 (two) times daily with a meal.  . naproxen (NAPROSYN) 500 MG tablet Take 1 tablet (500 mg total) by mouth 2 (two) times daily as needed.  Marland Kitchen omeprazole (PRILOSEC) 40 MG capsule Take 1 capsule (40 mg total) by mouth 2 (two)  times daily.  . ondansetron (ZOFRAN ODT) 4 MG disintegrating tablet Take 1 tablet (4 mg total) by mouth every 8 (eight) hours as needed for nausea or vomiting.  . polyethylene glycol powder (GLYCOLAX/MIRALAX) 17 GM/SCOOP powder Mix 17 gm/1 scoop of water with 16 or more oz per day of fluid  . spironolactone (ALDACTONE) 25 MG tablet Take 1 tablet (25 mg total) by mouth daily.  Marland Kitchen SYNTHROID 150 MCG tablet Take 150 mcg by mouth daily.  . [DISCONTINUED] ketorolac (TORADOL) 10 MG tablet Take 1 tablet (10 mg total) by mouth every 6 (six) hours as needed.  . [DISCONTINUED] metroNIDAZOLE (FLAGYL) 500 MG tablet Take 1 tablet (500 mg total) by mouth 2 (two) times daily.   No facility-administered encounter medications on file as of 06/27/2019.     REVIEW OF SYSTEMS  : All other systems reviewed and negative except where noted in the History of Present Illness.   PHYSICAL EXAM: BP 124/76   Pulse 64   Temp 98.2 F (36.8 C)   Ht 5\' 3"  (1.6 m)   Wt 167 lb 2 oz (75.8 kg)   BMI 29.60 kg/m  General: Well developed AA female in no acute distress Head: Normocephalic and atraumatic Eyes:  Sclerae anicteric, conjunctiva pink. Ears: Normal auditory acuity Lungs: Clear throughout to auscultation; no increased WOB. Heart: Regular rate and rhythm; no M/R/G. Abdomen: Soft, non-distended.  BS present.  LUQ TTP.  Firm uterus noted at the umbilicus due to fibroids. Musculoskeletal: Symmetrical with no gross deformities  Skin: No lesions on visible extremities Extremities: No edema  Neurological: Alert oriented x 4, grossly non-focal Psychological:  Alert and cooperative. Normal mood and affect  ASSESSMENT AND PLAN: *LUQ abdominal pain:  Somewhat strange description of symptoms.  ? Ulcer.  Almost sounds like shingles, but no rash and she describes bloating as well.  Also had distal esophageal wall thickening on previous CT scan.  Will plan for EGD with Dr. Rush Landmark.  Continue omeprazole 40 mg BID for now (new  medication that was prescribed by her PCP for this issue). *Chronic constipation:  Continue Miralax. *IDDM:  Insulin will be adjusted prior to endoscopic procedure per protocol. Will resume normal dosing after procedure.  **Will obtain previous colonoscopy records from [place]. **The risks, benefits, and alternatives to EGD were discussed with the patient and she consents to proceed.   CC:  Antony Blackbird, MD

## 2019-06-29 ENCOUNTER — Encounter: Payer: Self-pay | Admitting: Gastroenterology

## 2019-06-29 DIAGNOSIS — K5909 Other constipation: Secondary | ICD-10-CM

## 2019-06-29 DIAGNOSIS — R1032 Left lower quadrant pain: Secondary | ICD-10-CM | POA: Insufficient documentation

## 2019-06-29 DIAGNOSIS — Z8639 Personal history of other endocrine, nutritional and metabolic disease: Secondary | ICD-10-CM | POA: Insufficient documentation

## 2019-06-29 HISTORY — DX: Other constipation: K59.09

## 2019-07-02 NOTE — Progress Notes (Signed)
Attending Physician's Attestation   I have reviewed the chart.   I agree with the Advanced Practitioner's note, impression, and recommendations with any updates as below.  Agree with diagnostic endoscopy.  We will plan for diagnostic biopsies.  Await colonoscopy report to review and determine timing for follow-up screening/surveillance.  Justice Britain, MD [facility] Advanced Endoscopy Office # [phone]

## 2019-07-04 ENCOUNTER — Other Ambulatory Visit: Payer: Self-pay | Admitting: Gastroenterology

## 2019-07-04 ENCOUNTER — Other Ambulatory Visit: Payer: Self-pay

## 2019-07-04 ENCOUNTER — Ambulatory Visit (INDEPENDENT_AMBULATORY_CARE_PROVIDER_SITE_OTHER): Payer: Self-pay

## 2019-07-04 DIAGNOSIS — Z1159 Encounter for screening for other viral diseases: Secondary | ICD-10-CM

## 2019-07-05 LAB — SARS CORONAVIRUS 2 (TAT 6-24 HRS): SARS Coronavirus 2: NEGATIVE

## 2019-07-06 ENCOUNTER — Encounter: Payer: Self-pay | Admitting: Gastroenterology

## 2019-07-06 ENCOUNTER — Ambulatory Visit (AMBULATORY_SURGERY_CENTER): Payer: Self-pay | Admitting: Gastroenterology

## 2019-07-06 ENCOUNTER — Other Ambulatory Visit: Payer: Self-pay

## 2019-07-06 VITALS — BP 126/77 | HR 79 | Temp 96.9°F | Resp 13 | Ht 63.0 in | Wt 167.0 lb

## 2019-07-06 DIAGNOSIS — K3189 Other diseases of stomach and duodenum: Secondary | ICD-10-CM

## 2019-07-06 DIAGNOSIS — K2970 Gastritis, unspecified, without bleeding: Secondary | ICD-10-CM

## 2019-07-06 DIAGNOSIS — K259 Gastric ulcer, unspecified as acute or chronic, without hemorrhage or perforation: Secondary | ICD-10-CM

## 2019-07-06 DIAGNOSIS — R1012 Left upper quadrant pain: Secondary | ICD-10-CM

## 2019-07-06 DIAGNOSIS — K228 Other specified diseases of esophagus: Secondary | ICD-10-CM

## 2019-07-06 HISTORY — PX: UPPER GASTROINTESTINAL ENDOSCOPY: SHX188

## 2019-07-06 MED ORDER — SODIUM CHLORIDE 0.9 % IV SOLN: 500.0000 mL | Freq: Once | INTRAVENOUS | Status: DC

## 2019-07-06 MED ORDER — SUCRALFATE 1 GM/10ML PO SUSP: 1.0000 g | Freq: Four times a day (QID) | ORAL | 2 refills | Status: DC

## 2019-07-06 MED FILL — CARAFATE 1 GM/10 ML SUSP: 1 | 10 days supply | Qty: 420 | Fill #0

## 2019-07-06 NOTE — Progress Notes (Signed)
Report to PACU, RN, vss, BBS= Clear.  

## 2019-07-06 NOTE — Op Note (Signed)
[facility] Patient Name: Suzanne Cline Procedure Date: 07/06/2019 2:32 PM MRN: [id number] Endoscopist: Justice Britain , MD Age: 54 Referring MD:  Date of Birth: 13-Nov-1965 Gender: Female Account #: [id number] Procedure:                Upper GI endoscopy Indications:              Abdominal pain in the left upper quadrant,                            Abdominal bloating Medicines:                Monitored Anesthesia Care Procedure:                Pre-Anesthesia Assessment:                           - Prior to the procedure, a History and Physical                            was performed, and patient medications and                            allergies were reviewed. The patient's tolerance of                            previous anesthesia was also reviewed. The risks                            and benefits of the procedure and the sedation                            options and risks were discussed with the patient.                            All questions were answered, and informed consent                            was obtained. Prior Anticoagulants: The patient has                            taken no previous anticoagulant or antiplatelet                            agents. ASA Grade Assessment: II - A patient with                            mild systemic disease. After reviewing the risks                            and benefits, the patient was deemed in                            satisfactory condition to undergo the procedure.  After obtaining informed consent, the endoscope was                            passed under direct vision. Throughout the                            procedure, the patient's blood pressure, pulse, and                            oxygen saturations were monitored continuously. The                            Endoscope was introduced through the mouth, and                            advanced to the second part of duodenum.  The upper                            GI endoscopy was accomplished without difficulty.                            The patient tolerated the procedure. Scope In: Scope Out: Findings:                 No gross lesions were noted in the entire                            esophagus. Biopsies were taken with a cold forceps                            for histology.                           The Z-line was regular and was found 40 cm from the                            incisors.                           Striped moderately erythematous mucosa without                            bleeding was found in the gastric antrum.                           Multiple dispersed small erosions with no bleeding                            and no stigmata of recent bleeding were found in                            the gastric antrum. Biopsies were taken with a cold                            forceps for  histology and Helicobacter pylori                            testing.                           No gross lesions were noted in the duodenal bulb,                            in the first portion of the duodenum and in the                            second portion of the duodenum. Biopsies for                            histology were taken with a cold forceps for                            evaluation of celiac disease. Complications:            No immediate complications. Estimated Blood Loss:     Estimated blood loss was minimal. Impression:               - No gross lesions in esophagus. Biopsied.                           - Z-line regular, 40 cm from the incisors.                           - Erythematous mucosa in the antrum. Erosive                            gastropathy with no bleeding and no stigmata of                            recent bleeding. Biopsied.                           - No gross lesions in the duodenal bulb, in the                            first portion of the duodenum and in the second                             portion of the duodenum. Biopsied. Recommendation:           - The patient will be observed post-procedure,                            until all discharge criteria are met.                           - Discharge patient to home.                           - Patient  has a contact number available for                            emergencies. The signs and symptoms of potential                            delayed complications were discussed with the                            patient. Return to normal activities tomorrow.                            Written discharge instructions were provided to the                            patient.                           - Resume previous diet.                           - Await pathology results.                           - Would try to continue taking current PPI for                            another 4-6 weeks to allow healing of the noted                            gastritis - unclear that if she was not on it                            during this time would this look worse.                           - Use sucralfate suspension 1 gram PO QID for 6                            weeks (do not take any medication 1 hour before or                            after this).                           - For bloating would trial Simethicone 125 mg 3-4                            times per day (Can get this as Gas-X or we can                            provide a prescription).                           -  No aspirin, ibuprofen, naproxen, or other                            non-steroidal anti-inflammatory drugs.                           - Return to GI clinic in 4-weeks.                           - Consider EPI evaluation and SIBO evaluation at                            follow up.                           - The findings and recommendations were discussed                            with the patient. Justice Britain, MD 07/06/2019 3:23:39 PM

## 2019-07-06 NOTE — Patient Instructions (Signed)
Information on gastritis given to you today.  Await pathology results.  Continue present PPI medication for another 4-6 week to allow healing of the gastritis.  Use Sucrafate suspension 1 gram by mouth 4 times a day for 6 weeks. Start by taking once at breakfast, once at dinner and once at bedtime.  Then increase to 4 times a day.  Try GAS-X 125mg  for gas.  No aspirin, ibuprofen, naproxen or any other non steroidal anti-inflammatory medications.  Return to GI clinic in 4 weeks.  YOU HAD AN ENDOSCOPIC PROCEDURE TODAY AT [facility]:   Refer to the procedure report that was given to you for any specific questions about what was found during the examination.  If the procedure report does not answer your questions, please call your gastroenterologist to clarify.  If you requested that your care partner not be given the details of your procedure findings, then the procedure report has been included in a sealed envelope for you to review at your convenience later.  YOU SHOULD EXPECT: Some feelings of bloating in the abdomen. Passage of more gas than usual.  Walking can help get rid of the air that was put into your GI tract during the procedure and reduce the bloating. If you had a lower endoscopy (such as a colonoscopy or flexible sigmoidoscopy) you may notice spotting of blood in your stool or on the toilet paper. If you underwent a bowel prep for your procedure, you may not have a normal bowel movement for a few days.  Please Note:  You might notice some irritation and congestion in your nose or some drainage.  This is from the oxygen used during your procedure.  There is no need for concern and it should clear up in a day or so.  SYMPTOMS TO REPORT IMMEDIATELY:    Following upper endoscopy (EGD)  Vomiting of blood or coffee ground material  New chest pain or pain under the shoulder blades  Painful or persistently difficult swallowing  New shortness of breath  Fever of  100F or higher  Black, tarry-looking stools  For urgent or emergent issues, a gastroenterologist can be reached at any hour by calling [phone]. Do not use MyChart messaging for urgent concerns.    DIET:  We do recommend a small meal at first, but then you may proceed to your regular diet.  Drink plenty of fluids but you should avoid alcoholic beverages for 24 hours.  ACTIVITY:  You should plan to take it easy for the rest of today and you should NOT DRIVE or use heavy machinery until tomorrow (because of the sedation medicines used during the test).    FOLLOW UP: Our staff will call the number listed on your records 48-72 hours following your procedure to check on you and address any questions or concerns that you may have regarding the information given to you following your procedure. If we do not reach you, we will leave a message.  We will attempt to reach you two times.  During this call, we will ask if you have developed any symptoms of COVID 19. If you develop any symptoms (ie: fever, flu-like symptoms, shortness of breath, cough etc.) before then, please call [phone].  If you test positive for Covid 19 in the 2 weeks post procedure, please call and report this information to [place].    If any biopsies were taken you will be contacted by phone or by letter within the next 1-3 weeks.  Please  call us at [phone] if you have not heard about the biopsies in 3 weeks.    SIGNATURES/CONFIDENTIALITY: You and/or your care partner have signed paperwork which will be entered into your electronic medical record.  These signatures attest to the fact that that the information above on your After Visit Summary has been reviewed and is understood.  Full responsibility of the confidentiality of this discharge information lies with you and/or your care-partner.

## 2019-07-06 NOTE — Progress Notes (Signed)
Temp check by:JB Vital check by:DT  The medical and surgical history was reviewed and verified with the patient. 

## 2019-07-06 NOTE — Progress Notes (Signed)
Called to room to assist during endoscopic procedure.  Patient ID and intended procedure confirmed with present staff. Received instructions for my participation in the procedure from the performing physician.  

## 2019-07-10 ENCOUNTER — Telehealth: Payer: Self-pay

## 2019-07-10 ENCOUNTER — Telehealth: Payer: Self-pay | Admitting: Gastroenterology

## 2019-07-10 ENCOUNTER — Encounter: Payer: Self-pay | Admitting: *Deleted

## 2019-07-10 ENCOUNTER — Telehealth: Payer: Self-pay | Admitting: *Deleted

## 2019-07-10 NOTE — Telephone Encounter (Signed)
No answer, left message to call back later today, B.Tyrihanna Wingert RN. 

## 2019-07-10 NOTE — Telephone Encounter (Signed)
Patient called states she is in alot of lower back pain and she asking what kind of pain medication can she take.

## 2019-07-10 NOTE — Telephone Encounter (Signed)
Pt. Called wanting to speak with a nurse, I returned pt's call. She had endoscopy on 07/06/19. Patient states she still has the LUQ and abd pain she had prior to her procedure, states it has improved somewhat, however. She has been taking her PPI and states she thinks the Sucralfate is helping.States she began having left-sided lower back pain Friday evening, which is new. Feels like she has air and states she has tried taking Simethicone without relief. Asks if it's OK to take Tylenol for her back pain since she was advised not to take NSAIDs. Denies bleeding or other symptoms. I told her that she can take Tylenol, advised her to try warm liquids, walking, continued Simethicone and that I would route this message to Dr. Rush Landmark for further advisement. Asked patient to call us back if she develops other symptoms, gets worse, or doesn't hear back from Dr. Donneta Romberg office. Pt. Verbalizes understanding.

## 2019-07-10 NOTE — Telephone Encounter (Signed)
error 

## 2019-07-10 NOTE — Telephone Encounter (Signed)
Patty, I think it is reasonable to use Tylenol.  If she is still having issues then can be set up with PA visit.  Thanks. GM

## 2019-07-10 NOTE — Telephone Encounter (Signed)
The pt has been advised to use tylenol and call back if she does not improve.

## 2019-07-10 NOTE — Telephone Encounter (Signed)
2nd follow up call attempt.  Message left previously this am.

## 2019-07-18 ENCOUNTER — Encounter: Payer: Self-pay | Admitting: Gastroenterology

## 2019-07-25 MED FILL — OMEPRAZOLE DR 40 MG CAPSULE: 40 | 30 days supply | Qty: 60 | Fill #1

## 2019-08-03 MED FILL — CARAFATE 1 GM/10 ML SUSP: 1 | 10 days supply | Qty: 420 | Fill #1

## 2019-08-03 MED FILL — GABAPENTIN 300 MG CAPSULE: 300 | 30 days supply | Qty: 180 | Fill #3

## 2019-08-24 ENCOUNTER — Telehealth: Payer: Self-pay | Admitting: Family Medicine

## 2019-08-24 ENCOUNTER — Encounter: Payer: Self-pay | Admitting: Gastroenterology

## 2019-08-24 ENCOUNTER — Ambulatory Visit (INDEPENDENT_AMBULATORY_CARE_PROVIDER_SITE_OTHER): Payer: Self-pay | Admitting: Gastroenterology

## 2019-08-24 VITALS — BP 112/64 | HR 88 | Temp 98.5°F | Ht 63.0 in | Wt 159.0 lb

## 2019-08-24 DIAGNOSIS — R1012 Left upper quadrant pain: Secondary | ICD-10-CM

## 2019-08-24 DIAGNOSIS — K5909 Other constipation: Secondary | ICD-10-CM

## 2019-08-24 DIAGNOSIS — K200 Eosinophilic esophagitis: Secondary | ICD-10-CM

## 2019-08-24 DIAGNOSIS — K219 Gastro-esophageal reflux disease without esophagitis: Secondary | ICD-10-CM

## 2019-08-24 DIAGNOSIS — R140 Abdominal distension (gaseous): Secondary | ICD-10-CM

## 2019-08-24 MED FILL — OMEPRAZOLE DR 40 MG CAPSULE: 40 | 30 days supply | Qty: 60 | Fill #2

## 2019-08-24 NOTE — Telephone Encounter (Signed)
Patient would like a refill on her SYNTHROID 150 MCG tablet  Patient would like [facility] to [facility]. Please f/u

## 2019-08-24 NOTE — Patient Instructions (Addendum)
If you are age 55 or older, your body mass index should be between 23-30. Your Body mass index is 28.17 kg/m. If this is out of the aforementioned range listed, please consider follow up with your Primary Care Provider.  If you are age 39 or younger, your body mass index should be between 19-25. Your Body mass index is 28.17 kg/m. If this is out of the aformentioned range listed, please consider follow up with your Primary Care Provider.   Decrease Carafate to twice daily.   Continue Omeprazole twice daily until your next EGD in July 2021. I will call you to schedule this in the next few weeks.   Miralax- use 1 capful daily.  -Please call office and let us know if you are already using Miralax or Fiber supplement.   If no relief with Miralax, then you make use samples of Linzess 163mcg- once daily.   Thank you for choosing me and [facility].  Dr. Rush Landmark

## 2019-08-24 NOTE — Progress Notes (Signed)
bla

## 2019-08-25 ENCOUNTER — Encounter: Payer: Self-pay | Admitting: Gastroenterology

## 2019-08-25 DIAGNOSIS — K200 Eosinophilic esophagitis: Secondary | ICD-10-CM

## 2019-08-25 DIAGNOSIS — R1012 Left upper quadrant pain: Secondary | ICD-10-CM | POA: Insufficient documentation

## 2019-08-25 DIAGNOSIS — K219 Gastro-esophageal reflux disease without esophagitis: Secondary | ICD-10-CM | POA: Insufficient documentation

## 2019-08-25 DIAGNOSIS — R140 Abdominal distension (gaseous): Secondary | ICD-10-CM | POA: Insufficient documentation

## 2019-08-25 HISTORY — DX: Eosinophilic esophagitis: K20.0

## 2019-08-25 MED ORDER — SYNTHROID 150 MCG PO TABS: 150.0000 ug | ORAL_TABLET | Freq: Every day | ORAL | 0 refills | Status: DC

## 2019-08-25 NOTE — Progress Notes (Signed)
[place] VISIT   Primary Care Provider Antony Blackbird, MD [place] [id number] [phone]  Patient Profile: Suzanne Cline is a 54 y.o. female with a pmh significant for CHF, complicated diabetes, sleep apnea, thyroid disease, hypertension, hyperlipidemia, tobacco use, chronic constipation, diverticulosis.  The patient presents to the [facility] for an evaluation and management of problem(s) noted below:  Problem List 1. Bloating   2. Chronic constipation   3. LUQ abdominal pain   4. Esophageal eosinophilia   5. Gastroesophageal reflux disease without esophagitis     History of Present Illness Please see initial consultation note by PA Zehr for full details of HPI.  Interval History The patient returns for scheduled follow-up after recent endoscopy last month that I performed.  She was found to have evidence of a normal-appearing esophagus though had esophageal eosinophilia with greater than 13 eosinophils per high-power field.  Her stomach lining showed some gastropathy but no evidence of H. pylori infection or precancerous changes.  Her duodenal biopsies showed evidence of Brunner's gland but no evidence of celiac disease or enteropathy.  We initiated the patient on Carafate as well as Gas-X.  Since the patient's endoscopy, the patient no longer is experiencing the significant episodes of left upper quadrant abdominal discomfort and pain.  She is happy with this.  She her gas/bloating symptoms have improved by more than 50% on current therapies.  The patient is hopeful that things continue to improve.  The patient denies significant dysphagia at this time.  She previously had not really noted pyrosis but now that she takes the medication she no longer has a significant issue in regards to acid taste occurring.  She denies reflux.  We have not received the colonoscopy report from [place] as of yet.  GI Review of  Systems Positive as above Negative for nausea, vomiting, melena, hematochezia  Review of Systems General: Denies fevers/chills/weight loss HEENT: Denies oral lesions Cardiovascular: Denies chest pain/palpitations Pulmonary: Denies shortness of breath/cough Gastroenterological: See HPI Genitourinary: Denies darkened urine Hematological: Denies easy bruising/bleeding Dermatological: Denies jaundice Psychological: Mood is stable   Medications Current Outpatient Medications  Medication Sig Dispense Refill  . atorvastatin (LIPITOR) 40 MG tablet Take 40 mg by mouth daily.    . carvedilol (COREG) 25 MG tablet Take 25 mg by mouth 2 (two) times daily with a meal.    . furosemide (LASIX) 40 MG tablet Take 40 mg by mouth at bedtime.     . gabapentin (NEURONTIN) 300 MG capsule 2 pills (600mg ) twice per day and at bedtime for neuropathic pain (Patient taking differently: Take 600 mg by mouth 2 (two) times daily. ) 180 capsule 3  . glipiZIDE (GLUCOTROL) 10 MG tablet Take 10 mg by mouth 2 (two) times daily before a meal.    . insulin glargine (LANTUS) 100 unit/mL SOPN Inject 0.2 mLs (20 Units total) into the skin daily. 15 mL 4  . losartan (COZAAR) 100 MG tablet Take 1 tablet (100 mg total) by mouth daily. 90 tablet 3  . metFORMIN (GLUCOPHAGE) 1000 MG tablet Take 1,000 mg by mouth 2 (two) times daily with a meal.    . naproxen (NAPROSYN) 500 MG tablet Take 1 tablet (500 mg total) by mouth 2 (two) times daily as needed. 30 tablet 0  . omeprazole (PRILOSEC) 40 MG capsule Take 1 capsule (40 mg total) by mouth 2 (two) times daily. 60 capsule 3  . ondansetron (ZOFRAN ODT) 4 MG disintegrating tablet Take  1 tablet (4 mg total) by mouth every 8 (eight) hours as needed for nausea or vomiting. 20 tablet 0  . spironolactone (ALDACTONE) 25 MG tablet Take 1 tablet (25 mg total) by mouth daily. 90 tablet 1  . sucralfate (CARAFATE) 1 GM/10ML suspension Take 10 mLs (1 g total) by mouth 4 (four) times daily. 420 mL 2   . SYNTHROID 150 MCG tablet Take 1 tablet (150 mcg total) by mouth daily. Must have office visit for refills 30 tablet 0   No current facility-administered medications for this visit.    Allergies Allergies  Allergen Reactions  . Lisinopril Cough  . Oxycodone Itching    Histories Past Medical History:  Diagnosis Date  . CHF (congestive heart failure) ([place])   . Diabetes mellitus without complication ([place])   . Fibroids, intramural    s/p uterine artery embolization  . Former cigarette smoker 04/2011  . History of Resolved Nonischemic Congestive Cardiomyopathy ([place]) 04/2011   Echo March 2013: EF 35% with mild/moderate diastolic dysfunction -> Follow-Up Echo March 2014 EF 55% with G1 DD.  Marland Kitchen Hypertension   . Neuromuscular disorder ([place])    diabetic neuropathy  . OSA (obstructive sleep apnea)   . Sleep apnea    doesn't use cpap regularly  . Thyroid disease    post thyroidectomy   Past Surgical History:  Procedure Laterality Date  . ABLATION     uterus  . COLONOSCOPY    . LEFT HEART CATH AND CORONARY ANGIOGRAPHY  05/19/2011   ([facility]): Normal coronary arteries  . THYROIDECTOMY    . TRANSTHORACIC ECHOCARDIOGRAM  07/18/2011   [facility] [place]: EF 35% with mild to moderate DD. -->  . TRANSTHORACIC ECHOCARDIOGRAM     [facility] [place]: EF 55%, G1 DD.  LVIDd 4.3; July 12, 2015: EF 55 to 60%.  Normal valves  . UPPER GASTROINTESTINAL ENDOSCOPY  07/06/2019   Social History   Socioeconomic History  . Marital status: Single    Spouse name: Not on file  . Number of children: Not on file  . Years of education: Not on file  . Highest education level: Not on file  Occupational History  . Not on file  Tobacco Use  . Smoking status: Current Some Day Smoker    Types: Cigars  . Smokeless tobacco: Never Used  Substance and Sexual Activity  . Alcohol use: Yes    Comment: social  . Drug use: Never  . Sexual activity: Not Currently  Other  Topics Concern  . Not on file  Social History Narrative  . Not on file   Social Determinants of Health   Financial Resource Strain:   . Difficulty of Paying Living Expenses:   Food Insecurity:   . Worried About Charity fundraiser in the Last Year:   . Arboriculturist in the Last Year:   Transportation Needs: Unmet Transportation Needs  . Lack of Transportation (Medical): Yes  . Lack of Transportation (Non-Medical): Yes  Physical Activity:   . Days of Exercise per Week:   . Minutes of Exercise per Session:   Stress:   . Feeling of Stress :   Social Connections:   . Frequency of Communication with Friends and Family:   . Frequency of Social Gatherings with Friends and Family:   . Attends Religious Services:   . Active Member of Clubs or Organizations:   . Attends Archivist Meetings:   Marland Kitchen Marital Status:   Intimate Partner Violence:   .  Fear of Current or Ex-Partner:   . Emotionally Abused:   Marland Kitchen Physically Abused:   . Sexually Abused:    Family History  Problem Relation Age of Onset  . Congestive Heart Failure Mother   . Breast cancer Neg Hx   . Colon cancer Neg Hx   . Esophageal cancer Neg Hx   . Rectal cancer Neg Hx   . Stomach cancer Neg Hx   . Inflammatory bowel disease Neg Hx   . Liver disease Neg Hx   . Pancreatic cancer Neg Hx    I have reviewed her medical, social, and family history in detail and updated the electronic medical record as necessary.    PHYSICAL EXAMINATION  BP 112/64   Pulse 88   Temp 98.5 F (36.9 C)   Ht 5\' 3"  (1.6 m)   Wt 159 lb (72.1 kg)   BMI 28.17 kg/m  Wt Readings from Last 3 Encounters:  08/24/19 159 lb (72.1 kg)  07/06/19 167 lb (75.8 kg)  06/27/19 167 lb 2 oz (75.8 kg)  GEN: NAD, appears stated age, doesn't appear chronically ill PSYCH: Cooperative, without pressured speech EYE: Conjunctivae pink, sclerae anicteric ENT: MMM CV: Nontachycardic RESP: No significant wheezing GI: NABS, soft, mildly protuberant in  the midepigastrium, mild tenderness to palpation in the left upper quadrant upon deep palpation, no rebound or guarding  MSK/EXT: Trace bilateral lower extremity edema SKIN: No jaundice NEURO:  Alert & Oriented x 3, no focal deficits   REVIEW OF DATA  I reviewed the following data at the time of this encounter:  GI Procedures and Studies  March 2021 EGD - No gross lesions in esophagus. Biopsied. - Z-line regular, 40 cm from the incisors. - Erythematous mucosa in the antrum. Erosive gastropathy with no bleeding and no stigmata of recent bleeding. Biopsied. - No gross lesions in the duodenal bulb, in the first portion of the duodenum and in the second portion of the duodenum. Biopsied. Pathology Diagnosis 1. Surgical [P], duodenal - DUODENAL MUCOSA WITH MILD BRUNNER GLAND HYPERPLASIA. - NO FEATURES OF SPRUE OR GRANULOMAS. 2. Surgical [P], gastric - ANTRAL MUCOSA WITH REACTIVE CHANGES. - OXYNTIC MUCOSA WITH SLIGHT HYPEREMIA. - WARTHIN-STARRY NEGATIVE FOR HELICOBACTER PYLORI. - NO INTESTINAL METAPLASIA, DYSPLASIA OR CARCINOMA. 3. Surgical [P], esophageal - BENIGN SQUAMOUS MUCOSA WITH MILDLY INCREASED EOSINOPHILS. - FOCALLY UP TO 13 PER HIGH POWER FIELD.  Laboratory Studies  Reviewed those in epic  Imaging Studies  February 2021 right upper quadrant ultrasound FINDINGS: Gallbladder: No gallstones or wall thickening visualized. There is no pericholecystic fluid. No sonographic Murphy sign noted by sonographer. Common bile duct: Diameter: 4 mm. No intrahepatic or extrahepatic biliary duct dilatation. Liver: No focal lesion identified. Within normal limits in parenchymal echogenicity. Portal vein is patent on color Doppler imaging with normal direction of blood flow towards the liver. Other: Pancreas normal in appearance. No focal lesion evident to the left of midline at the site of patient's maximum pain. IMPRESSION: Study within normal limits.  December 2020 CT abdomen  pelvis with contrast IMPRESSION: 1. Minimal distal esophageal wall thickening, can be seen with reflux or esophagitis. 2. No other acute abnormality in the abdomen/pelvis. Particularly, normal appendix. 3. Mild colonic diverticulosis without diverticulitis. 4. Peripherally calcified 11 cm mass in the pelvis is presumably a calcified uterine fibroid, not significantly changed from exam more than 1 year ago.   ASSESSMENT  Ms. Chiara is a 54 y.o. female with a pmh significant for CHF, complicated diabetes, sleep apnea, thyroid  disease, hypertension, hyperlipidemia, tobacco use, chronic constipation, diverticulosis.  The patient is seen today for evaluation and management of:  1. Bloating   2. Chronic constipation   3. LUQ abdominal pain   4. Esophageal eosinophilia   5. Gastroesophageal reflux disease without esophagitis    The patient is hemodynamically stable.  The patient will remain on her current dosing of PPI as well as Carafate.  We will try to decrease Carafate to twice daily.  We will continue omeprazole twice daily until a repeat endoscopy is performed.  The repeat endoscopy is being performed due to the significant elevation in esophageal eosinophilia that was noted on recent EGD biopsies.  I like to see if she is PPI responsive or not.  Interestingly, the patient is not having such significant heartburn symptoms or overt dysphagia that I would think she truly has eosinophilic esophagitis but we need to at least consider and see what happens.  I likely will decrease PPI dosing after next endoscopy but we will see how things look.  In regards to the patient's longstanding constipation we will have the patient initiate fiber supplement.  She is not clear if she already has a fiber supplement or MiraLAX at home but she will call and let us know.  The patient will and should be on fiber supplementation 1-2 times daily.  She should also be on MiraLAX 1 capful to 2 capfuls daily but start at  1 capful daily.  If no relief with MiraLAX after 2-1/2 to 3 weeks then recommend trial of Linzess 145 mcg once daily.  Samples have been given to the patient for trial.  We will try to get her colonoscopy report and depending on how her symptoms are and the timing of when that last colonoscopy was can certainly consider diagnostic colonoscopy if required.  The risks and benefits of endoscopic evaluation were discussed with the patient; these include but are not limited to the risk of perforation, infection, bleeding, missed lesions, lack of diagnosis, severe illness requiring hospitalization, as well as anesthesia and sedation related illnesses.  The patient is agreeable to proceed.  All patient questions were answered, to the best of my ability, and the patient agrees to the aforementioned plan of action with follow-up as indicated.   PLAN  Continue PPI twice daily for now Continue Carafate but decrease to twice daily (in the morning and before bedtime) Fiber supplementation 1-2 times daily MiraLAX use 1-2 times daily If no relief with MiraLAX on improving constipation should the constipation actually be a cause for her bloating and discomfort, then will initiate Linzess 145 mcg daily (patient given samples today) Repeat endoscopy at 2-1/2 to 3 months from previous to evaluate the esophageal eosinophilia (proximal/middle biopsy bottle and distal biopsy bottle to be performed)   No orders of the defined types were placed in this encounter.   New Prescriptions   No medications on file   Modified Medications   Modified Medication Previous Medication   SYNTHROID 150 MCG TABLET SYNTHROID 150 MCG tablet      Take 1 tablet (150 mcg total) by mouth daily. Must have office visit for refills    Take 150 mcg by mouth daily.    Planned Follow Up No follow-ups on file.   Total Time in Face-to-Face and in Coordination of Care for patient including independent/personal interpretation/review of prior  testing, medical history, examination, medication adjustment, communicating results with the patient directly, and documentation with the EHR is 25 minutes.   Valarie Merino  Mansouraty, MD [facility] # [id number]

## 2019-09-01 ENCOUNTER — Other Ambulatory Visit: Payer: Self-pay

## 2019-09-01 ENCOUNTER — Ambulatory Visit: Payer: Self-pay | Attending: Family Medicine | Admitting: Family Medicine

## 2019-09-01 ENCOUNTER — Encounter: Payer: Self-pay | Admitting: Family Medicine

## 2019-09-01 VITALS — BP 180/110 | HR 82 | Temp 97.9°F | Ht 63.0 in | Wt 158.0 lb

## 2019-09-01 DIAGNOSIS — E890 Postprocedural hypothyroidism: Secondary | ICD-10-CM

## 2019-09-01 DIAGNOSIS — F411 Generalized anxiety disorder: Secondary | ICD-10-CM

## 2019-09-01 DIAGNOSIS — G4700 Insomnia, unspecified: Secondary | ICD-10-CM

## 2019-09-01 DIAGNOSIS — R0602 Shortness of breath: Secondary | ICD-10-CM

## 2019-09-01 DIAGNOSIS — I110 Hypertensive heart disease with heart failure: Secondary | ICD-10-CM

## 2019-09-01 DIAGNOSIS — F430 Acute stress reaction: Secondary | ICD-10-CM

## 2019-09-01 DIAGNOSIS — I10 Essential (primary) hypertension: Secondary | ICD-10-CM

## 2019-09-01 DIAGNOSIS — R1012 Left upper quadrant pain: Secondary | ICD-10-CM

## 2019-09-01 DIAGNOSIS — I5030 Unspecified diastolic (congestive) heart failure: Secondary | ICD-10-CM

## 2019-09-01 DIAGNOSIS — Z789 Other specified health status: Secondary | ICD-10-CM

## 2019-09-01 DIAGNOSIS — K5909 Other constipation: Secondary | ICD-10-CM

## 2019-09-01 DIAGNOSIS — R140 Abdominal distension (gaseous): Secondary | ICD-10-CM

## 2019-09-01 DIAGNOSIS — E119 Type 2 diabetes mellitus without complications: Secondary | ICD-10-CM

## 2019-09-01 MED ORDER — AMLODIPINE BESYLATE 5 MG PO TABS: 5.0000 mg | ORAL_TABLET | Freq: Every day | ORAL | 1 refills | Status: DC

## 2019-09-01 MED ORDER — HYOSCYAMINE SULFATE 0.125 MG PO TABS: 0.1250 mg | ORAL_TABLET | ORAL | 2 refills | Status: DC | PRN

## 2019-09-01 MED FILL — ?AMLODIPINE BESYLATE 5MG TA: 5 | 30 days supply | Qty: 30 | Fill #0

## 2019-09-01 MED FILL — HYOSCYAMINE SULF 0.125 MG T: 0.125 | 7 days supply | Qty: 30 | Fill #0

## 2019-09-01 NOTE — Progress Notes (Signed)
Established Patient Office Visit  Subjective:  Patient ID: Suzanne Cline, female    DOB: Aug 27, 1965  Age: 54 y.o. MRN: [id number]  CC: Ongoing abdominal pain  HPI Suzanne Cline, 54 year old female, who presents in follow-up of chronic medical issues as well as complaint of ongoing left upper abdominal pain status post recent GI visit.  Patient also reports issues with insomnia and difficulty keeping up with her job duties due to her recurrent abdominal pain.  She reports that she works from home as she believes that sitting in the same position for extended periods is contributing to some of her abdominal pain.  She reports that GI thought that her abdominal pain was related to her chronic constipation issues but patient does not believe that this is the case.  She reports constant dull aching that sometimes becomes sharp and crampy and is primarily in her left mid to lateral upper abdomen.  She also reports that she constantly feels as if her abdomen is bloated/distended.  She has had increased anxiety over concerns regarding the cause of her abdominal pain.  She also request a leave of absence from work as she is afraid that due to her issues with insomnia and chronic abdominal pain that she is not able to handle her work responsibilities at this time and she fears that she will be let go from her position if she does not take time off to find out what is wrong with her.          She reports that she has been compliant with her blood pressure medication and she believes that her blood pressure may be elevated secondary to stress.  She continues to take medication for treatment of her diabetes and she denies any episodes of blood sugars falling too low.  Fasting blood sugars are generally in the 120s or less.  She denies any increased thirst or frequent urination.  She also takes medication for hypothyroidism.  She does have issues with constipation.  She denies any issues with swelling in her legs.        She denies any issues with chest pain or palpitations, she does feel as if she is having shortness of breath but denies cough.  She has had no recent fever or chills.  She does have some occasional headaches but denies dizziness.  She denies any blood in the stool or dark stools.  She reports that the Carafate she was prescribed by GI slightly helps but she feels that she would feel better if she had something that would better help to coat the inside of her abdomen and she also sometimes has a burning sensation in her upper abdomen.  She denies burping/belching or the sensation of backwash of bad tasting fluid into her mouth or throat.  She is having trouble both falling asleep and staying asleep.  She denies any suicidal thoughts or ideations.  She does not regularly use her CPAP.  Past Medical History:  Diagnosis Date  . CHF (congestive heart failure) ([place])   . Diabetes mellitus without complication ([place])   . Fibroids, intramural    s/p uterine artery embolization  . Former cigarette smoker 04/2011  . History of Resolved Nonischemic Congestive Cardiomyopathy ([place]) 04/2011   Echo March 2013: EF 35% with mild/moderate diastolic dysfunction -> Follow-Up Echo March 2014 EF 55% with G1 DD.  Marland Kitchen Hypertension   . Neuromuscular disorder ([place])    diabetic neuropathy  . OSA (obstructive sleep apnea)   . Sleep apnea  doesn't use cpap regularly  . Thyroid disease    post thyroidectomy    Past Surgical History:  Procedure Laterality Date  . ABLATION     uterus  . COLONOSCOPY    . LEFT HEART CATH AND CORONARY ANGIOGRAPHY  05/19/2011   ([facility]): Normal coronary arteries  . THYROIDECTOMY    . TRANSTHORACIC ECHOCARDIOGRAM  07/18/2011   [facility] [place]: EF 35% with mild to moderate DD. -->  . TRANSTHORACIC ECHOCARDIOGRAM     [facility] [place]: EF 55%, G1 DD.  LVIDd 4.3; July 12, 2015: EF 55 to 60%.  Normal valves  . UPPER GASTROINTESTINAL ENDOSCOPY   07/06/2019    Family History  Problem Relation Age of Onset  . Congestive Heart Failure Mother   . Breast cancer Neg Hx   . Colon cancer Neg Hx   . Esophageal cancer Neg Hx   . Rectal cancer Neg Hx   . Stomach cancer Neg Hx   . Inflammatory bowel disease Neg Hx   . Liver disease Neg Hx   . Pancreatic cancer Neg Hx     Social History   Socioeconomic History  . Marital status: Single    Spouse name: Not on file  . Number of children: Not on file  . Years of education: Not on file  . Highest education level: Not on file  Occupational History  . Not on file  Tobacco Use  . Smoking status: Current Some Day Smoker    Types: Cigars  . Smokeless tobacco: Never Used  Substance and Sexual Activity  . Alcohol use: Yes    Comment: social  . Drug use: Never  . Sexual activity: Not Currently  Other Topics Concern  . Not on file  Social History Narrative  . Not on file   Social Determinants of Health   Financial Resource Strain:   . Difficulty of Paying Living Expenses:   Food Insecurity:   . Worried About Charity fundraiser in the Last Year:   . Arboriculturist in the Last Year:   Transportation Needs: Unmet Transportation Needs  . Lack of Transportation (Medical): Yes  . Lack of Transportation (Non-Medical): Yes  Physical Activity:   . Days of Exercise per Week:   . Minutes of Exercise per Session:   Stress:   . Feeling of Stress :   Social Connections:   . Frequency of Communication with Friends and Family:   . Frequency of Social Gatherings with Friends and Family:   . Attends Religious Services:   . Active Member of Clubs or Organizations:   . Attends Archivist Meetings:   Marland Kitchen Marital Status:   Intimate Partner Violence:   . Fear of Current or Ex-Partner:   . Emotionally Abused:   Marland Kitchen Physically Abused:   . Sexually Abused:     Outpatient Medications Prior to Visit  Medication Sig Dispense Refill  . atorvastatin (LIPITOR) 40 MG tablet Take 40 mg by  mouth daily.    . carvedilol (COREG) 25 MG tablet Take 25 mg by mouth 2 (two) times daily with a meal.    . furosemide (LASIX) 40 MG tablet Take 40 mg by mouth at bedtime.     . gabapentin (NEURONTIN) 300 MG capsule 2 pills (600mg ) twice per day and at bedtime for neuropathic pain (Patient taking differently: Take 600 mg by mouth 2 (two) times daily. ) 180 capsule 3  . glipiZIDE (GLUCOTROL) 10 MG tablet Take 10 mg by  mouth 2 (two) times daily before a meal.    . insulin glargine (LANTUS) 100 unit/mL SOPN Inject 0.2 mLs (20 Units total) into the skin daily. 15 mL 4  . losartan (COZAAR) 100 MG tablet Take 1 tablet (100 mg total) by mouth daily. 90 tablet 3  . metFORMIN (GLUCOPHAGE) 1000 MG tablet Take 1,000 mg by mouth 2 (two) times daily with a meal.    . naproxen (NAPROSYN) 500 MG tablet Take 1 tablet (500 mg total) by mouth 2 (two) times daily as needed. 30 tablet 0  . omeprazole (PRILOSEC) 40 MG capsule Take 1 capsule (40 mg total) by mouth 2 (two) times daily. 60 capsule 3  . ondansetron (ZOFRAN ODT) 4 MG disintegrating tablet Take 1 tablet (4 mg total) by mouth every 8 (eight) hours as needed for nausea or vomiting. 20 tablet 0  . spironolactone (ALDACTONE) 25 MG tablet Take 1 tablet (25 mg total) by mouth daily. 90 tablet 1  . sucralfate (CARAFATE) 1 GM/10ML suspension Take 10 mLs (1 g total) by mouth 4 (four) times daily. 420 mL 2  . SYNTHROID 150 MCG tablet Take 1 tablet (150 mcg total) by mouth daily. Must have office visit for refills 30 tablet 0   No facility-administered medications prior to visit.    Allergies  Allergen Reactions  . Lisinopril Cough  . Oxycodone Itching    ROS Review of Systems  Constitutional: Positive for fatigue. Negative for chills and fever.  HENT: Negative for sore throat and trouble swallowing.   Eyes: Negative for photophobia and visual disturbance.  Respiratory: Positive for shortness of breath. Negative for cough.   Cardiovascular: Negative for  chest pain, palpitations and leg swelling.  Gastrointestinal: Positive for abdominal distention, abdominal pain and constipation. Negative for blood in stool and diarrhea.  Endocrine: Negative for polydipsia, polyphagia and polyuria.  Genitourinary: Negative for dysuria, flank pain and frequency.  Musculoskeletal: Positive for back pain (Occasional). Negative for arthralgias.  Skin: Negative for rash.  Neurological: Positive for headaches. Negative for dizziness.  Hematological: Negative for adenopathy. Does not bruise/bleed easily.  Psychiatric/Behavioral: Positive for sleep disturbance. Negative for self-injury and suicidal ideas. The patient is nervous/anxious.       Objective:    Physical Exam  Constitutional: She is oriented to person, place, and time. She appears well-developed and well-nourished. She appears distressed.  Well-nourished well-developed older female who initially appeared to not feel well/be in mild distress as patient was lying on her back on the exam table with knees bent and slightly moaning and complaining of abdominal pain.  Eyes: Conjunctivae and EOM are normal. No scleral icterus.  Neck: No JVD present. No thyromegaly present.  Cardiovascular: Normal rate and regular rhythm.  Pulmonary/Chest: Effort normal and breath sounds normal.  Abdominal: Soft. She exhibits distension (Mild). There is abdominal tenderness (Left upper quadrant discomfort to palpation). There is no rebound and no guarding.  Musculoskeletal:        General: No tenderness or edema.     Cervical back: Normal range of motion and neck supple.  Lymphadenopathy:    She has no cervical adenopathy.  Neurological: She is alert and oriented to person, place, and time.  Skin: Skin is warm and dry.  Psychiatric:  Initially very anxious  Nursing note and vitals reviewed.   BP (!) 180/110   Pulse 82   Temp 97.9 F (36.6 C) (Temporal)   Ht 5\' 3"  (1.6 m)   Wt 158 lb (71.7 kg)   SpO2  98%   BMI  27.99 kg/m  Wt Readings from Last 3 Encounters:  09/01/19 158 lb (71.7 kg)  08/24/19 159 lb (72.1 kg)  07/06/19 167 lb (75.8 kg)     Health Maintenance Due  Topic Date Due  . PNEUMOCOCCAL POLYSACCHARIDE VACCINE AGE 37-64 HIGH RISK  Never done  . OPHTHALMOLOGY EXAM  Never done  . HIV Screening  Never done  . COVID-19 Vaccine (1) Never done  . TETANUS/TDAP  Never done  . MAMMOGRAM  Never done  . COLON CANCER SCREENING ANNUAL FOBT  03/02/2019  . FOOT EXAM  03/03/2019    There are no preventive care reminders to display for this patient.  Lab Results  Component Value Date   TSH 13.900 (H) 09/01/2019   Lab Results  Component Value Date   WBC 7.5 09/01/2019   HGB 13.7 09/01/2019   HCT 41.3 09/01/2019   MCV 88 09/01/2019   PLT 236 09/01/2019   Lab Results  Component Value Date   NA 142 09/01/2019   K 3.6 09/01/2019   CO2 24 09/01/2019   GLUCOSE 101 (H) 09/01/2019   BUN 10 09/01/2019   CREATININE 0.98 09/01/2019   BILITOT 0.3 09/01/2019   ALKPHOS 70 09/01/2019   AST 12 09/01/2019   ALT 9 09/01/2019   PROT 7.1 09/01/2019   ALBUMIN 4.6 09/01/2019   CALCIUM 10.0 09/01/2019   ANIONGAP 11 04/24/2019   No results found for: CHOL No results found for: HDL No results found for: LDLCALC No results found for: TRIG No results found for: CHOLHDL Lab Results  Component Value Date   HGBA1C 6.3 (H) 09/01/2019      Assessment & Plan:  1. Left upper quadrant abdominal pain; chronic constipation Patient has seen gastroenterology regarding her left upper quadrant pain.  Patient does not believe that constipation is playing a role in her recurrent left upper quadrant pain.  She also feels that sitting for prolonged periods at her desk causes her to have some increase in abdominal pain.  She is to continue follow-up with gastroenterology and as patient has had chronic issues with this pain, she will also be scheduled for CT of the abdomen and pelvis with and without contrast.   Prescription provided for Levsin to help with abdominal cramping sensation.  She will also have CBC to look for infection/blood loss on comprehensive metabolic panel to look for electrolyte or liver abnormality which may be related to her abdominal pain.  Gastroenterology note from 08/24/2019 was reviewed and discussed with the patient. - CBC with Differential - Comprehensive metabolic panel - hyoscyamine (LEVSIN) 0.125 MG tablet; Take 1 tablet (0.125 mg total) by mouth every 4 (four) hours as needed. For stomach cramping  Dispense: 30 tablet; Refill: 2 - CT ABDOMEN PELVIS W WO CONTRAST; Future  2. Abdominal bloating Patient with complaint of recurrent abdominal bloating.  Will check Ca 125 as this can be elevated with ovarian masses.  She will also have comprehensive metabolic panel to look for liver disorder or electrolyte abnormality.  Prescription for Levsin to help with cramping and bloating sensation and she will be scheduled for CT of the abdomen and pelvis and follow-up. - CA 125 - Comprehensive metabolic panel - hyoscyamine (LEVSIN) 0.125 MG tablet; Take 1 tablet (0.125 mg total) by mouth every 4 (four) hours as needed. For stomach cramping  Dispense: 30 tablet; Refill: 2 - CT ABDOMEN PELVIS W WO CONTRAST; Future  3. Shortness of breath; hypertensive heart disease with diastolic heart  failure Or review of chart, patient with a history of CHF and patient with elevated/uncontrolled blood pressure at today's visit and complaint of shortness of breath.  She does not have any rales on examination and no peripheral edema.  Will check BNP in follow-up.. We will place referral for cardiology follow-up - Brain natriuretic peptide  4. Type 2 diabetes mellitus without complication, without long-term current use of insulin ([place]) She feels that her diabetes remains well controlled.  Her last hemoglobin A1c was 6.6 in January of this year and she will have repeat hemoglobin A1c at today's visit.  She  will continue her current medications including Metformin, glipizide and Lantus. - Hemoglobin A1c  5. Insomnia, unspecified type; anxiety and acute stress reaction; need for follow-up follow-up by social work she reports chronic issues with insomnia/inability to fall asleep and stay asleep.  She is encouraged to initially try over-the-counter melatonin up to 10 mg at bedtime.  She also seems to be under a great deal of stress at this time which is likely a contributing factor.  She has asked to be written out of work for a while as she feels that she cannot keep up with her job productivity at this time due to her issues with her abdominal pain, fatigue and lack of sleep.  She will be written out of work until May 24 but will be reevaluated in the interim to see if her absence from work requires an extension.   6. Postoperative hypothyroidism Currently on Synthroid and will check T4 and TSH as patient with complaint of increased fatigue.  She will be notified if a change is needed in the dose of her medication based on her lab results. - T4 AND TSH  7. Essential hypertension Blood pressure is elevated at today's visit despite patient's current spironolactone 25 mg, losartan 100 mg, carvedilol and Lasix 40 mg.  We will add amlodipine 5 mg once daily to help with control of blood pressure.  She is also having issues with increased stress and anxiety which is likely also contributing to her elevated blood pressure.  She has been written out of work due to her current health concerns.  Cardiology referral placed in follow-up of hypertension and CHF.  She should go to the emergency department if she has any issues with headache, chest pain, increased shortness of breath, focal numbness or weakness/strokelike symptoms or any other concerns. - amLODipine (NORVASC) 5 MG tablet; Take 1 tablet (5 mg total) by mouth daily. To lower blood pressure  Dispense: 90 tablet; Refill: 1   Meds ordered this encounter    Medications  . hyoscyamine (LEVSIN) 0.125 MG tablet    Sig: Take 1 tablet (0.125 mg total) by mouth every 4 (four) hours as needed. For stomach cramping    Dispense:  30 tablet    Refill:  2  . amLODipine (NORVASC) 5 MG tablet    Sig: Take 1 tablet (5 mg total) by mouth daily. To lower blood pressure    Dispense:  90 tablet    Refill:  1    An After Visit Summary was printed and given to the patient.   Follow-up: Return in about 2 weeks (around 09/15/2019) for abdominal pain follow-up; ED if acute worsening of pain or any concerns.     More than 45 minutes of face-to-face time was spent with the patient at today's visit including obtaining her current medical history/symptoms, review of systems, performing physical examination, formulating an assessment and treatment plan  which were discussed with the patient and to which she agreed, placement of orders for labs, medications and referrals as well as the need for additional time spent for review of chart and completion of today's visit note.  Antony Blackbird, MD

## 2019-09-01 NOTE — Progress Notes (Signed)
Per pt it hurts for her to talk  Per pt she is having really back SOB  Per pt the top part of her abd hurts

## 2019-09-02 LAB — CBC WITH DIFFERENTIAL/PLATELET
Basophils Absolute: 0 x10E3/uL (ref 0.0–0.2)
Basos: 1 %
EOS (ABSOLUTE): 0.2 x10E3/uL (ref 0.0–0.4)
Eos: 3 %
Hematocrit: 41.3 % (ref 34.0–46.6)
Hemoglobin: 13.7 g/dL (ref 11.1–15.9)
Immature Grans (Abs): 0 x10E3/uL (ref 0.0–0.1)
Immature Granulocytes: 0 %
Lymphocytes Absolute: 2.8 x10E3/uL (ref 0.7–3.1)
Lymphs: 37 %
MCH: 29.3 pg (ref 26.6–33.0)
MCHC: 33.2 g/dL (ref 31.5–35.7)
MCV: 88 fL (ref 79–97)
Monocytes Absolute: 0.4 x10E3/uL (ref 0.1–0.9)
Monocytes: 5 %
Neutrophils Absolute: 4 x10E3/uL (ref 1.4–7.0)
Neutrophils: 54 %
Platelets: 236 x10E3/uL (ref 150–450)
RBC: 4.68 x10E6/uL (ref 3.77–5.28)
RDW: 13.3 % (ref 11.7–15.4)
WBC: 7.5 x10E3/uL (ref 3.4–10.8)

## 2019-09-02 LAB — COMPREHENSIVE METABOLIC PANEL WITH GFR
ALT: 9 IU/L (ref 0–32)
AST: 12 IU/L (ref 0–40)
Albumin/Globulin Ratio: 1.8 (ref 1.2–2.2)
Albumin: 4.6 g/dL (ref 3.8–4.9)
Alkaline Phosphatase: 70 IU/L (ref 39–117)
BUN/Creatinine Ratio: 10 (ref 9–23)
BUN: 10 mg/dL (ref 6–24)
Bilirubin Total: 0.3 mg/dL (ref 0.0–1.2)
CO2: 24 mmol/L (ref 20–29)
Calcium: 10 mg/dL (ref 8.7–10.2)
Chloride: 101 mmol/L (ref 96–106)
Creatinine, Ser: 0.98 mg/dL (ref 0.57–1.00)
GFR calc Af Amer: 76 mL/min/1.73
GFR calc non Af Amer: 66 mL/min/1.73
Globulin, Total: 2.5 g/dL (ref 1.5–4.5)
Glucose: 101 mg/dL — ABNORMAL HIGH (ref 65–99)
Potassium: 3.6 mmol/L (ref 3.5–5.2)
Sodium: 142 mmol/L (ref 134–144)
Total Protein: 7.1 g/dL (ref 6.0–8.5)

## 2019-09-02 LAB — BRAIN NATRIURETIC PEPTIDE: BNP: 135.5 pg/mL — ABNORMAL HIGH (ref 0.0–100.0)

## 2019-09-02 LAB — HEMOGLOBIN A1C
Est. average glucose Bld gHb Est-mCnc: 134 mg/dL
Hgb A1c MFr Bld: 6.3 % — ABNORMAL HIGH (ref 4.8–5.6)

## 2019-09-02 LAB — T4 AND TSH
T4, Total: 4.8 ug/dL (ref 4.5–12.0)
TSH: 13.9 u[IU]/mL — ABNORMAL HIGH (ref 0.450–4.500)

## 2019-09-02 LAB — CA 125: Cancer Antigen (CA) 125: 5.9 U/mL (ref 0.0–38.1)

## 2019-09-07 ENCOUNTER — Other Ambulatory Visit: Payer: Self-pay | Admitting: *Deleted

## 2019-09-07 ENCOUNTER — Other Ambulatory Visit: Payer: Self-pay

## 2019-09-07 ENCOUNTER — Telehealth: Payer: Self-pay | Admitting: Gastroenterology

## 2019-09-07 ENCOUNTER — Ambulatory Visit (HOSPITAL_COMMUNITY)
Admission: RE | Admit: 2019-09-07 | Discharge: 2019-09-07 | Disposition: A | Discharge status: Home / Self Care | Payer: Self-pay | Source: Ambulatory Visit | Attending: Family Medicine | Admitting: Family Medicine

## 2019-09-07 DIAGNOSIS — R140 Abdominal distension (gaseous): Secondary | ICD-10-CM | POA: Insufficient documentation

## 2019-09-07 DIAGNOSIS — R1012 Left upper quadrant pain: Secondary | ICD-10-CM | POA: Insufficient documentation

## 2019-09-07 MED ORDER — IOHEXOL 300 MG/ML  SOLN
100.0000 mL | Freq: Once | INTRAMUSCULAR | Status: AC | PRN
  Administered 2019-09-07: 100 mL via INTRAVENOUS

## 2019-09-07 NOTE — Telephone Encounter (Signed)
Left message on machine to call back  

## 2019-09-07 NOTE — Progress Notes (Unsigned)
T

## 2019-09-07 NOTE — Telephone Encounter (Signed)
The pt states she was advised that she would like to move forward with EGD at this time and not wait until July.  Please advise

## 2019-09-08 ENCOUNTER — Other Ambulatory Visit: Payer: Self-pay

## 2019-09-08 ENCOUNTER — Other Ambulatory Visit: Payer: Self-pay | Admitting: Family Medicine

## 2019-09-08 ENCOUNTER — Telehealth: Payer: Self-pay | Admitting: Gastroenterology

## 2019-09-08 DIAGNOSIS — R911 Solitary pulmonary nodule: Secondary | ICD-10-CM

## 2019-09-08 DIAGNOSIS — Z1159 Encounter for screening for other viral diseases: Secondary | ICD-10-CM

## 2019-09-08 NOTE — Telephone Encounter (Signed)
The pt has been scheduled for EGD and previsit as well as COVID appt and order.  The pt advised

## 2019-09-08 NOTE — Telephone Encounter (Signed)
OK to proceed with EGD at end of May or beginning of June.   Need enough time with PPI on board to see if it helps the esophageal eosinophilia. Thanks. GM

## 2019-09-08 NOTE — Telephone Encounter (Signed)
Left message on machine to call back  

## 2019-09-08 NOTE — Telephone Encounter (Signed)
Opened incorrectly as was supposed to be a Documentation Note. I did not talk with the patient today.  PA Zehr received the Colonoscopy report from 2017 that only showed diverticulosis.  Report to be scanned into chart. Depending on symptoms in future will consider need/role of repeat lower endoscopic evaluation. Again no actual telephone communication occurred between patient and I today.   Justice Britain, MD [facility] # [id number]

## 2019-09-18 ENCOUNTER — Other Ambulatory Visit: Payer: Self-pay

## 2019-09-18 ENCOUNTER — Encounter: Payer: Self-pay | Admitting: Family Medicine

## 2019-09-18 ENCOUNTER — Encounter: Payer: Self-pay | Admitting: Cardiology

## 2019-09-18 ENCOUNTER — Ambulatory Visit (INDEPENDENT_AMBULATORY_CARE_PROVIDER_SITE_OTHER): Payer: Self-pay | Admitting: Cardiology

## 2019-09-18 ENCOUNTER — Ambulatory Visit: Payer: Self-pay | Attending: Family Medicine | Admitting: Family Medicine

## 2019-09-18 VITALS — BP 152/108 | HR 87 | Ht 63.0 in | Wt 150.2 lb

## 2019-09-18 VITALS — BP 123/82 | HR 81 | Ht 63.0 in | Wt 150.2 lb

## 2019-09-18 DIAGNOSIS — I110 Hypertensive heart disease with heart failure: Secondary | ICD-10-CM

## 2019-09-18 DIAGNOSIS — I5030 Unspecified diastolic (congestive) heart failure: Secondary | ICD-10-CM

## 2019-09-18 DIAGNOSIS — E039 Hypothyroidism, unspecified: Secondary | ICD-10-CM

## 2019-09-18 DIAGNOSIS — R5383 Other fatigue: Secondary | ICD-10-CM

## 2019-09-18 DIAGNOSIS — R911 Solitary pulmonary nodule: Secondary | ICD-10-CM

## 2019-09-18 DIAGNOSIS — F418 Other specified anxiety disorders: Secondary | ICD-10-CM

## 2019-09-18 DIAGNOSIS — I10 Essential (primary) hypertension: Secondary | ICD-10-CM

## 2019-09-18 DIAGNOSIS — G8929 Other chronic pain: Secondary | ICD-10-CM

## 2019-09-18 DIAGNOSIS — R4589 Other symptoms and signs involving emotional state: Secondary | ICD-10-CM

## 2019-09-18 DIAGNOSIS — Z01810 Encounter for preprocedural cardiovascular examination: Secondary | ICD-10-CM

## 2019-09-18 DIAGNOSIS — E1169 Type 2 diabetes mellitus with other specified complication: Secondary | ICD-10-CM

## 2019-09-18 DIAGNOSIS — R1012 Left upper quadrant pain: Secondary | ICD-10-CM

## 2019-09-18 DIAGNOSIS — E785 Hyperlipidemia, unspecified: Secondary | ICD-10-CM

## 2019-09-18 MED FILL — CARAFATE 1 GM/10 ML SUSP: 1 | 10 days supply | Qty: 420 | Fill #2

## 2019-09-18 NOTE — Progress Notes (Addendum)
Primary Care Provider: Antony Blackbird, MD Cardiologist: No primary care provider on file. Electrophysiologist: None  Clinic Note: Chief Complaint  Patient presents with  . Congestive Heart Failure    BMP checked for left upper quadrant pain = 135    HPI:    Suzanne Cline is a 54 y.o. female with a PMH notable for history of nonischemic cardiomyopathy back in 2013 (with resolution by follow-up echo in 2017), hypertensive heart disease, DM 2 (uncontrolled) who presents today for rereferral by Antony Blackbird, MD due to "elevated BNP "135 and patient with history of CHF--this was noted in setting of left upper quadrant abdominal pain with "shortness of breath ".  Suzanne Cline was last seen on June 07, 2019 for preop evaluation for uterine fibroid surgery. -> Started on losartan for hypertension.  Opted to review outside records From [facility] and hold off on rechecking echocardiogram based on most recent evaluation showing resolution of cardiomyopathy.  Recent Hospitalizations: None  09/01/2019: Was seen at [facility] and [facility] -> complains of left upper quadrant abdominal pain along with insomnia and difficulty doing her job duties due to abdominal pain.  Chronic constipation noted normal abdominal pain and difficulty sitting in the same position for steady period.  Feels dull aching, sharp and crampy pain in the mid to lateral left upper quadrant, associated with significant anxiety.  Noted compliance with meds.-Was significantly hypertensive likely related to stress and pain.  Denies chest pain, pressure or palpitations.  Does note dyspnea but no cough.  Does not routinely use her CPAP.  BNP ordered and was 135.5 considered to have heart failure based on this, referred back to cardiology  She was also referred to GI medicine   Reviewed  CV studies:    The following studies were reviewed today: (if available, images/films reviewed: From  Epic Chart or Care Everywhere) . No new studies: (Results from [facility] reviewed and PMH/PSH updated) . 09/08/2019 CT abdomen pelvis: No acute findings.  Stable large calcified uterine fibroid large, peripherally calcified pedunculated fibroid measures 11.7 cm).  Indeterminate 7 mm right lower lobe pulmonary nodule.   Interval History:   Suzanne Cline is being seen today for a BMP of 135 and prior history of CHF.  She denies any PND, orthopnea, or edema.  She denies any chest pain or pressure with rest exertion.  She may have a little dyspnea, but really only because she has not been doing much lately.  The main thing she complains to me about is abdominal pain and nausea.  She is not sleeping well because of this.  She is got chronic constipation with bloating.  CV Review of Symptoms (Summary) Cardiovascular ROS: positive for - dyspnea on exertion and She really says is more because of her pain limiting her activity. negative for - edema, irregular heartbeat, orthopnea, palpitations, paroxysmal nocturnal dyspnea, rapid heart rate, shortness of breath or Syncope/near syncope, TIA/amaurosis fugax, claudication  The patient does not have symptoms concerning for COVID-19 infection (fever, chills, cough, or new shortness of breath).  The patient is practicing social distancing & Masking.    REVIEWED OF SYSTEMS   Review of Systems  Constitutional: Positive for malaise/fatigue (Not sleeping well). Negative for weight loss.  HENT: Negative for congestion and nosebleeds.   Respiratory: Positive for shortness of breath (She says she is short of breath, cannot explain details). Negative for cough.   Gastrointestinal: Positive for abdominal pain, constipation and nausea. Negative for blood  in stool, melena and vomiting.  Genitourinary: Negative for hematuria.  Neurological: Positive for dizziness. Negative for tingling, focal weakness and headaches.  Psychiatric/Behavioral: Negative  for memory loss. The patient is nervous/anxious and has insomnia (Not sleeping because of anxiety and pain).    I have reviewed and (if needed) personally updated the patient's problem list, medications, allergies, past medical and surgical history, social and family history.   PAST MEDICAL HISTORY   Past Medical History:  Diagnosis Date  . Diabetes mellitus without complication ([place])   . Fibroids, intramural    s/p uterine artery embolization  . Former cigarette smoker 04/2011  . History of Resolved Nonischemic Congestive Cardiomyopathy ([place]) 04/2011   Echo March 2013: EF 35% with mild/moderate diastolic dysfunction -> Follow-Up Echo March 2014 EF 55% with G1 DD.  Marland Kitchen Hypertension   . Hypertensive heart disease    Difficult control hypertension  . Neuromuscular disorder ([place])    diabetic neuropathy  . OSA (obstructive sleep apnea)   . Sleep apnea    doesn't use cpap regularly  . Thyroid disease    post thyroidectomy    PAST SURGICAL HISTORY   Past Surgical History:  Procedure Laterality Date  . ABLATION     uterus  . COLONOSCOPY    . LEFT HEART CATH AND CORONARY ANGIOGRAPHY  05/19/2011   ([facility]): Normal coronary arteries  . THYROIDECTOMY    . TRANSTHORACIC ECHOCARDIOGRAM  07/18/2011   [facility] [place]: EF 35% with mild to moderate DD. -->  . TRANSTHORACIC ECHOCARDIOGRAM     [facility] [place]: EF 55%, G1 DD.  LVIDd 4.3; July 12, 2015: EF 55 to 60%.  Normal valves  . UPPER GASTROINTESTINAL ENDOSCOPY  07/06/2019    MEDICATIONS/ALLERGIES   Current Meds  Medication Sig  . atorvastatin (LIPITOR) 40 MG tablet Take 40 mg by mouth daily.  . carvedilol (COREG) 25 MG tablet Take 25 mg by mouth 2 (two) times daily with a meal.  . furosemide (LASIX) 40 MG tablet Take 40 mg by mouth at bedtime.   . gabapentin (NEURONTIN) 300 MG capsule 2 pills (600mg ) twice per day and at bedtime for neuropathic pain (Patient taking differently: Take 600 mg by  mouth 2 (two) times daily. )  . glipiZIDE (GLUCOTROL) 10 MG tablet Take 10 mg by mouth 2 (two) times daily before a meal.  . insulin glargine (LANTUS) 100 unit/mL SOPN Inject 0.2 mLs (20 Units total) into the skin daily.  Marland Kitchen losartan (COZAAR) 100 MG tablet Take 1 tablet (100 mg total) by mouth daily.  . metFORMIN (GLUCOPHAGE) 1000 MG tablet Take 1,000 mg by mouth 2 (two) times daily with a meal.  . omeprazole (PRILOSEC) 40 MG capsule Take 1 capsule (40 mg total) by mouth 2 (two) times daily.  Marland Kitchen spironolactone (ALDACTONE) 25 MG tablet Take 1 tablet (25 mg total) by mouth daily.  . sucralfate (CARAFATE) 1 GM/10ML suspension Take 10 mLs (1 g total) by mouth 4 (four) times daily.  Marland Kitchen SYNTHROID 150 MCG tablet Take 1 tablet (150 mcg total) by mouth daily. Must have office visit for refills    Allergies  Allergen Reactions  . Lisinopril Cough  . Oxycodone Itching    SOCIAL HISTORY/FAMILY HISTORY   Reviewed in Epic:  Pertinent findings: No notable change  OBJCTIVE -PE, EKG, labs   Wt Readings from Last 3 Encounters:  09/18/19 150 lb 3.2 oz (68.1 kg)  09/18/19 150 lb 3.2 oz (68.1 kg)  09/01/19 158 lb (71.7 kg)  Physical Exam: BP (!) 152/108   Pulse 87   Ht 5\' 3"  (1.6 m)   Wt 150 lb 3.2 oz (68.1 kg)   SpO2 98%   BMI 26.61 kg/m  Physical Exam  Constitutional: She is oriented to person, place, and time. She appears well-developed and well-nourished. No distress.  Notably uncomfortable somewhat ill-appearing but nontoxic.  HENT:  Head: Normocephalic and atraumatic.  Neck: No hepatojugular reflux and no JVD present. Carotid bruit is not present.  Cardiovascular: Normal rate, regular rhythm, normal heart sounds and intact distal pulses.  No extrasystoles are present. PMI is not displaced. Exam reveals no gallop and no friction rub.  No murmur heard. Pulmonary/Chest: Effort normal and breath sounds normal. No respiratory distress. She has no wheezes. She has no rales.  Abdominal: Soft.  She exhibits no distension. There is abdominal tenderness (Diffuse, but notable in the left side more than right.). There is rebound.  Musculoskeletal:        General: No edema. Normal range of motion.     Cervical back: Normal range of motion and neck supple.  Neurological: She is alert and oriented to person, place, and time.  Psychiatric: She has a normal mood and affect. Her behavior is normal. Judgment and thought content normal.  Somewhat depressed mood.  Vitals reviewed.    Adult ECG Report Not checked  Recent Labs: 09/01/2019 BNP 135 No results found for: CHOL, HDL, LDLCALC, LDLDIRECT, TRIG, CHOLHDL Lab Results  Component Value Date   CREATININE 0.98 09/01/2019   BUN 10 09/01/2019   NA 142 09/01/2019   K 3.6 09/01/2019   CL 101 09/01/2019   CO2 24 09/01/2019   Lab Results  Component Value Date   TSH 13.900 (H) 09/01/2019    ASSESSMENT/PLAN    Problem List Items Addressed This Visit    Hypertensive heart disease with diastolic heart failure ([place]) - Primary (Chronic)    I reviewed her previous echocardiogram and it seemed like her EF had returned to normal.  She does have significant hypertension. With her prior history of nonischemic cardiomyopathy, she was already on carvedilol 25 mg twice daily, spironolactone 25 mg daily, losartan 50 mg daily which was increased to 100 mg.  She also was on furosemide 40 mg nightly but it apparently stopped taking until she was contacted by her PCP to restart after BNP level of 135 was seen.  At this point, we will recheck an echocardiogram to assess her EF.  I suspect that the minimally elevated BNP of 135 is probably related to hypertension and is not truly related to heart failure.  I would not expect to be overly symptomatic with this level of BNP. Do agree with restarting Lasix but would not consider this an acute exacerbation.  Plan: We check 2D echo -> reassess EF and diastolic function.  Closely monitor blood pressure.  I  suspect her pressures are higher today because of her abdominal pain.  Low threshold to add amlodipine (versus switch to a more potent ARB)  She is already on max dose carvedilol, losartan and spironolactone.      Relevant Orders   ECHOCARDIOGRAM COMPLETE   Essential hypertension (Chronic)    See above      Hyperlipidemia associated with type 2 diabetes mellitus (HCC) (Chronic)   Preop cardiovascular exam    As for preop evaluation, if a lot of this depends on her echocardiogram.  She has no active anginal symptoms and no true heart failure symptoms.  She has  diabetes but not on insulin.  Uterine fibroid resection would only be high risk if intra-abdominal surgery performed.  Regardless, she is on max dose carvedilol and I would not recommend any further cardiac valuation beyond 2D echo.  Provided the echo does not show any significant reduced EF or wall motion changes, would not require any further evaluation or treatment.  See detailed note from February 2021.      Relevant Orders   ECHOCARDIOGRAM COMPLETE   LUQ abdominal pain    Being evaluated by PCP.  I suspect that a lot of his pain episodes are probably related to her uterine fibroid.  She is now thinking about going ahead and proceeding with surgical removal.  Apparently this was not acted upon back in February when I initially thought it was can be taken care of.      Hypothyroid    TSH was 13.9 on May 7.  I do not see that this was commented on.  She is on Synthroid according to my list.  Will defer to PCP.  Perhaps additional dosing is required.          COVID-19 Education: The signs and symptoms of COVID-19 were discussed with the patient and how to seek care for testing (follow up with PCP or arrange E-visit).   The importance of social distancing and COVID-19 vaccination was discussed today.  I spent a total of 78minutes with the patient. >  50% of the time was spent in direct patient consultation.  Additional  time spent with chart review  / charting (studies, outside notes, etc): 10 Total Time: 28 min   Current medicines are reviewed at length with the patient today.  (+/- concerns) none  Notice: This dictation was prepared with Dragon dictation along with smaller phrase technology. Any transcriptional errors that result from this process are unintentional and may not be corrected upon review.  Patient Instructions / Medication Changes & Studies & Tests Ordered   Patient Instructions  Medication Instructions:  NO CHANGES  *If you need a refill on your cardiac medications before your next appointment, please call your pharmacy*   Lab Work: NOT NEEDED If you have labs (blood work) drawn today and your tests are completely normal, you will receive your results only by: Marland Kitchen MyChart Message (if you have MyChart) OR . A paper copy in the mail If you have any lab test that is abnormal or we need to change your treatment, we will call you to review the results.   Testing/Procedures: WILL BE SCHEDULE AT [place] Your physician has requested that you have an echocardiogram. Echocardiography is a painless test that uses sound waves to create images of your heart. It provides your doctor with information about the size and shape of your heart and how well your heart's chambers and valves are working. This procedure takes approximately one hour. There are no restrictions for this procedure.     Follow-Up: At [facility], you and your health needs are our priority.  As part of our continuing mission to provide you with exceptional heart care, we have created designated Provider Care Teams.  These Care Teams include your primary Cardiologist (physician) and Advanced Practice Providers (APPs -  Physician Assistants and Nurse Practitioners) who all work together to provide you with the care you need, when you need it.   Your next appointment:   12 month(s)  The format for your next  appointment:   In Person  Provider:  Glenetta Hew, MD   Other Instructions IF ECHO IS NORMAL WILL BE CLEARED FOR SURGERY AND FOLLOW UP IN 1 YEAR WITH DR Ellyn Hack    Studies Ordered:   Orders Placed This Encounter  Procedures  . ECHOCARDIOGRAM COMPLETE     Glenetta Hew, M.D., M.S. Interventional Cardiologist   Pager # [phone] Phone # [phone] [address] [place], [place] [address]   Thank you for choosing Heartcare at [facility]!!

## 2019-09-18 NOTE — Patient Instructions (Addendum)
Medication Instructions:  NO CHANGES  *If you need a refill on your cardiac medications before your next appointment, please call your pharmacy*   Lab Work: NOT NEEDED If you have labs (blood work) drawn today and your tests are completely normal, you will receive your results only by: Marland Kitchen MyChart Message (if you have MyChart) OR . A paper copy in the mail If you have any lab test that is abnormal or we need to change your treatment, we will call you to review the results.   Testing/Procedures: WILL BE SCHEDULE AT [place] Your physician has requested that you have an echocardiogram. Echocardiography is a painless test that uses sound waves to create images of your heart. It provides your doctor with information about the size and shape of your heart and how well your heart's chambers and valves are working. This procedure takes approximately one hour. There are no restrictions for this procedure.     Follow-Up: At [facility], you and your health needs are our priority.  As part of our continuing mission to provide you with exceptional heart care, we have created designated Provider Care Teams.  These Care Teams include your primary Cardiologist (physician) and Advanced Practice Providers (APPs -  Physician Assistants and Nurse Practitioners) who all work together to provide you with the care you need, when you need it.   Your next appointment:   12 month(s)  The format for your next appointment:   In Person  Provider:   Glenetta Hew, MD   Other Instructions IF ECHO IS NORMAL WILL BE CLEARED FOR SURGERY AND FOLLOW UP IN 1 YEAR WITH DR Ellyn Hack

## 2019-09-18 NOTE — Progress Notes (Signed)
Subjective:  Patient ID: Suzanne Cline, female    DOB: 06/24/1965  Age: 54 y.o. MRN: [id number]  CC: Abdominal Pain   HPI Suzanne Cline, 54 year old female, last seen in the office on 09/01/2019 at which time patient with complaint of ongoing left upper abdominal pain, insomnia and difficulty keeping up with her job responsibilities due to her chronic issues with pain and lack of sleep.  She reported that she works from home and being seated for extended periods was causing her to have an increase in her left upper abdominal pain.  Patient had been seen and evaluated by gastroenterology.  She also had elevated blood pressure which she believed to be stress related.  She requested time off from work due to her increased stress, insomnia and abdominal pain.  Initial blood pressure at her last visit was 180/110.  She was prescribed Levsin to help with abdominal cramping and was scheduled for CT of the abdomen and pelvis.  She was encouraged to schedule additional follow-up with gastroenterology.  She had CA-125 in follow-up of her abdominal bloating and Ca1 25 was negative.  She additionally had complained of shortness of breath at her last visit and patient with history of CHF and BNP was done and elevated at 135-she was notified to follow-up with cardiology and resume Lasix use if she was not already taking and cardiology referral will be placed at today's visit.  Patient had presence of pulmonary nodule on CT scan of the abdomen and pelvis and will need 31-month follow-up CT which has already been ordered.        At today's visit, she continues to complain of pain in the left upper abdomen.  She continues to have issues with sensation of abdominal bloating and constipation.  She has seen gastroenterology in follow-up.  Per patient she was questioned by GI as to why she was there and why her PCP ordered a CT of the abdomen pelvis.  She continues to feel fatigued.  She continues to feel as though sitting in an  upright position as she has to do for her job causes her to have increased abdominal pain.  She is currently working from home and has to answer calls/perform medication review/authorization.  She denies any current issues with chest pain or shortness of breath.  She does have occasional sensation of palpitations.  She does currently smoke.  She reports no prior history/awareness of pulmonary nodules/abnormal lung imaging.  Past Medical History:  Diagnosis Date  . Diabetes mellitus without complication ([place])   . Fibroids, intramural    s/p uterine artery embolization  . Former cigarette smoker 04/2011  . History of Resolved Nonischemic Congestive Cardiomyopathy ([place]) 04/2011   Echo March 2013: EF 35% with mild/moderate diastolic dysfunction -> Follow-Up Echo March 2014 EF 55% with G1 DD.  Marland Kitchen Hypertension   . Hypertensive heart disease    Difficult control hypertension  . Neuromuscular disorder ([place])    diabetic neuropathy  . OSA (obstructive sleep apnea)   . Sleep apnea    doesn't use cpap regularly  . Thyroid disease    post thyroidectomy    Past Surgical History:  Procedure Laterality Date  . ABLATION     uterus  . COLONOSCOPY    . LEFT HEART CATH AND CORONARY ANGIOGRAPHY  05/19/2011   ([facility]): Normal coronary arteries  . THYROIDECTOMY    . TRANSTHORACIC ECHOCARDIOGRAM  07/18/2011   [facility] [place]: EF 35% with mild to moderate DD. -->  .  TRANSTHORACIC ECHOCARDIOGRAM     [facility] [place]: EF 55%, G1 DD.  LVIDd 4.3; July 12, 2015: EF 55 to 60%.  Normal valves  . UPPER GASTROINTESTINAL ENDOSCOPY  07/06/2019    Family History  Problem Relation Age of Onset  . Congestive Heart Failure Mother   . Breast cancer Neg Hx   . Colon cancer Neg Hx   . Esophageal cancer Neg Hx   . Rectal cancer Neg Hx   . Stomach cancer Neg Hx   . Inflammatory bowel disease Neg Hx   . Liver disease Neg Hx   . Pancreatic cancer Neg Hx     Social History    Tobacco Use  . Smoking status: Current Some Day Smoker    Types: Cigars  . Smokeless tobacco: Never Used  Substance Use Topics  . Alcohol use: Yes    Comment: social    ROS Review of Systems  Constitutional: Positive for appetite change and fatigue. Negative for chills and fever.  HENT: Negative for sore throat and trouble swallowing.   Eyes: Negative for photophobia and visual disturbance.  Respiratory: Negative for cough and shortness of breath.   Cardiovascular: Positive for palpitations. Negative for chest pain and leg swelling.  Gastrointestinal: Positive for abdominal pain. Negative for constipation, diarrhea and nausea.  Endocrine: Negative for polydipsia, polyphagia and polyuria.  Genitourinary: Negative for dysuria and frequency.  Musculoskeletal: Positive for back pain. Negative for arthralgias.  Skin: Negative for rash and wound.  Neurological: Negative for dizziness and headaches.  Hematological: Negative for adenopathy.  Psychiatric/Behavioral: Positive for sleep disturbance. Negative for self-injury and suicidal ideas. The patient is nervous/anxious.     Objective:   Today's Vitals: BP 123/82   Pulse 81   Ht 5\' 3"  (1.6 m)   Wt 150 lb 3.2 oz (68.1 kg)   SpO2 99%   BMI 26.61 kg/m   Physical Exam Vitals and nursing note reviewed.  Constitutional:      General: She is not in acute distress.    Appearance: She is well-developed.     Comments: Patient initially lie down on the exam table in a fetal type position with her eyes closed when I enter the exam room.  Patient seemed awake and responded when her name was called and set up.  She does appear slightly fatigued.  She is wearing a mask as per office COVID-19 protocol.  Cardiovascular:     Rate and Rhythm: Normal rate and regular rhythm.  Pulmonary:     Effort: Pulmonary effort is normal. No respiratory distress.     Breath sounds: Normal breath sounds.  Abdominal:     General: Bowel sounds are normal.  There is distension (Mild).     Palpations: Abdomen is soft. There is no fluid wave or mass.     Tenderness: There is abdominal tenderness in the epigastric area and left upper quadrant. There is no right CVA tenderness, left CVA tenderness, guarding or rebound. Negative signs include Murphy's sign, McBurney's sign and psoas sign.  Musculoskeletal:        General: No tenderness.     Cervical back: Normal range of motion and neck supple. No rigidity or tenderness.     Right lower leg: No edema.     Left lower leg: No edema.  Lymphadenopathy:     Cervical: No cervical adenopathy.  Skin:    General: Skin is warm and dry.  Neurological:     General: No focal deficit present.  Mental Status: She is alert and oriented to person, place, and time.     Cranial Nerves: No cranial nerve deficit.  Psychiatric:        Mood and Affect: Mood is anxious.     Comments: Patient would repeatedly lie down after examination.  She did not seem to be in any acute distress with changes in position.     Assessment & Plan:  1. Chronic left upper quadrant pain She reports continued issues with chronic left upper quadrant pain and constipation for which she has been evaluated by gastroenterology.  She did have presence of large calcified uterine fibroid which could potentially be causing some discomfort.  She is encouraged to continue to follow-up with gastroenterology regarding her left upper quadrant pain and constipation.  It is recommended that she also schedule follow-up with her gynecologist in case abdominal pain is related to uterine fibroid.  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  2. Pulmonary nodule, right Patient with presence of indeterminate 7 mm right lower lobe pulmonary nodule on recent CT of the abdomen and pelvis.  She will be scheduled for noncontrast chest CT in 6 months.  Complete smoking cessation encouraged.  She should call or return sooner if she develops  any issues with cough, shortness of breath, chest pain or any concerns.  3. Hypertensive heart disease with diastolic heart failure ([place]) Blood pressure is controlled on current medications.  Patient with recent elevated BNP and she reports that she is compliant with use of furosemide and other medications for control of hypertension and CHF.  She denies any issues with swelling in her legs or shortness of breath when she attempts to lie down flat.  She has been encouraged to keep upcoming follow-up appointment with cardiology.  4. Anxiety about health Discussed with patient that the anxiety and stress about her health as well as issues with job insecurity related to her illness would benefit from further evaluation with psychiatry for treatment of underlying anxiety and depression as well as possible therapy.  She reports that she has received mental health follow-up in the past and is willing to be referred to psychiatry for further evaluation and treatment. - Ambulatory referral to Psychiatry  5. Fatigue, unspecified type She has continued complaints of fatigue and has had blood work at other recent visits.  She will have vitamin B12 level at today's visit per patient request as she wonders if receiving vitamin B12 injections could help with her fatigue. Discussed with patient that level will be checked to see if she has deficiency that requires treatment. - Vitamin B12  Outpatient Encounter Medications as of 09/18/2019  Medication Sig  . atorvastatin (LIPITOR) 40 MG tablet Take 40 mg by mouth daily.  . carvedilol (COREG) 25 MG tablet Take 25 mg by mouth 2 (two) times daily with a meal.  . furosemide (LASIX) 40 MG tablet Take 40 mg by mouth at bedtime.   . gabapentin (NEURONTIN) 300 MG capsule 2 pills (600mg ) twice per day and at bedtime for neuropathic pain (Patient taking differently: Take 600 mg by mouth 2 (two) times daily. )  . glipiZIDE (GLUCOTROL) 10 MG tablet Take 10 mg by mouth 2 (two)  times daily before a meal.  . insulin glargine (LANTUS) 100 unit/mL SOPN Inject 0.2 mLs (20 Units total) into the skin daily.  Marland Kitchen losartan (COZAAR) 100 MG tablet Take 1 tablet (100 mg total) by mouth daily.  . metFORMIN (GLUCOPHAGE) 1000 MG  tablet Take 1,000 mg by mouth 2 (two) times daily with a meal.  . omeprazole (PRILOSEC) 40 MG capsule Take 1 capsule (40 mg total) by mouth 2 (two) times daily.  Marland Kitchen spironolactone (ALDACTONE) 25 MG tablet Take 1 tablet (25 mg total) by mouth daily.  . sucralfate (CARAFATE) 1 GM/10ML suspension Take 10 mLs (1 g total) by mouth 4 (four) times daily.  Marland Kitchen SYNTHROID 150 MCG tablet Take 1 tablet (150 mcg total) by mouth daily. Must have office visit for refills   No facility-administered encounter medications on file as of 09/18/2019.    An After Visit Summary was printed and given to the patient.   Follow-up: Return in about 4 weeks (around 10/16/2019) for chronic issues; keep GI follow-up and return sooner if needed.    Antony Blackbird MD

## 2019-09-19 LAB — VITAMIN B12: Vitamin B-12: 1959 pg/mL — ABNORMAL HIGH (ref 232–1245)

## 2019-09-21 ENCOUNTER — Encounter: Payer: Self-pay | Admitting: Cardiology

## 2019-09-21 DIAGNOSIS — E039 Hypothyroidism, unspecified: Secondary | ICD-10-CM | POA: Insufficient documentation

## 2019-09-21 NOTE — Assessment & Plan Note (Addendum)
Being evaluated by PCP.  I suspect that a lot of his pain episodes are probably related to her uterine fibroid.  She is now thinking about going ahead and proceeding with surgical removal.  Apparently this was not acted upon back in February when I initially thought it was can be taken care of.

## 2019-09-21 NOTE — Assessment & Plan Note (Signed)
As for preop evaluation, if a lot of this depends on her echocardiogram.  She has no active anginal symptoms and no true heart failure symptoms.  She has diabetes but not on insulin.  Uterine fibroid resection would only be high risk if intra-abdominal surgery performed.  Regardless, she is on max dose carvedilol and I would not recommend any further cardiac valuation beyond 2D echo.  Provided the echo does not show any significant reduced EF or wall motion changes, would not require any further evaluation or treatment.  See detailed note from February 2021.

## 2019-09-21 NOTE — Assessment & Plan Note (Signed)
I reviewed her previous echocardiogram and it seemed like her EF had returned to normal.  She does have significant hypertension. With her prior history of nonischemic cardiomyopathy, she was already on carvedilol 25 mg twice daily, spironolactone 25 mg daily, losartan 50 mg daily which was increased to 100 mg.  She also was on furosemide 40 mg nightly but it apparently stopped taking until she was contacted by her PCP to restart after BNP level of 135 was seen.  At this point, we will recheck an echocardiogram to assess her EF.  I suspect that the minimally elevated BNP of 135 is probably related to hypertension and is not truly related to heart failure.  I would not expect to be overly symptomatic with this level of BNP. Do agree with restarting Lasix but would not consider this an acute exacerbation.  Plan: We check 2D echo -> reassess EF and diastolic function.  Closely monitor blood pressure.  I suspect her pressures are higher today because of her abdominal pain.  Low threshold to add amlodipine (versus switch to a more potent ARB)  She is already on max dose carvedilol, losartan and spironolactone.

## 2019-09-21 NOTE — Assessment & Plan Note (Addendum)
TSH was 13.9 on May 7.  I do not see that this was commented on.  She is on Synthroid according to my list.  Will defer to PCP.  Perhaps additional dosing is required.

## 2019-09-21 NOTE — Assessment & Plan Note (Signed)
See above

## 2019-10-02 ENCOUNTER — Ambulatory Visit (AMBULATORY_SURGERY_CENTER): Payer: Self-pay | Admitting: *Deleted

## 2019-10-02 ENCOUNTER — Other Ambulatory Visit: Payer: Self-pay

## 2019-10-02 VITALS — Ht 63.5 in | Wt 150.0 lb

## 2019-10-02 DIAGNOSIS — K219 Gastro-esophageal reflux disease without esophagitis: Secondary | ICD-10-CM

## 2019-10-02 DIAGNOSIS — K200 Eosinophilic esophagitis: Secondary | ICD-10-CM

## 2019-10-02 NOTE — Progress Notes (Signed)

## 2019-10-04 ENCOUNTER — Other Ambulatory Visit: Payer: Self-pay | Admitting: Family Medicine

## 2019-10-04 ENCOUNTER — Ambulatory Visit
Admission: RE | Admit: 2019-10-04 | Discharge: 2019-10-04 | Disposition: A | Discharge status: Home / Self Care | Payer: No Typology Code available for payment source | Source: Ambulatory Visit | Attending: Family Medicine | Admitting: Family Medicine

## 2019-10-04 ENCOUNTER — Other Ambulatory Visit: Payer: Self-pay

## 2019-10-04 ENCOUNTER — Ambulatory Visit
Admission: RE | Admit: 2019-10-04 | Discharge: 2019-10-04 | Disposition: A | Discharge status: Home / Self Care | Payer: Self-pay | Source: Ambulatory Visit | Attending: Obstetrics and Gynecology | Admitting: Obstetrics and Gynecology

## 2019-10-04 DIAGNOSIS — N644 Mastodynia: Secondary | ICD-10-CM

## 2019-10-04 DIAGNOSIS — N6489 Other specified disorders of breast: Secondary | ICD-10-CM

## 2019-10-05 ENCOUNTER — Ambulatory Visit (HOSPITAL_COMMUNITY): Payer: Self-pay | Attending: Cardiovascular Disease

## 2019-10-05 ENCOUNTER — Telehealth: Payer: Self-pay | Admitting: Family Medicine

## 2019-10-05 DIAGNOSIS — I5030 Unspecified diastolic (congestive) heart failure: Secondary | ICD-10-CM | POA: Insufficient documentation

## 2019-10-05 DIAGNOSIS — I110 Hypertensive heart disease with heart failure: Secondary | ICD-10-CM | POA: Insufficient documentation

## 2019-10-05 DIAGNOSIS — Z01810 Encounter for preprocedural cardiovascular examination: Secondary | ICD-10-CM | POA: Insufficient documentation

## 2019-10-05 MED ORDER — SYNTHROID 150 MCG PO TABS: 150.0000 ug | ORAL_TABLET | Freq: Every day | ORAL | 0 refills | Status: DC

## 2019-10-05 MED ORDER — SYNTHROID 150 MCG PO TABS: 150.0000 ug | ORAL_TABLET | Freq: Every day | ORAL | 2 refills | Status: DC

## 2019-10-05 NOTE — Telephone Encounter (Signed)
Rx sent 

## 2019-10-05 NOTE — Telephone Encounter (Signed)
Patient stopped in the office and requested for listed medication to be refilled. Patient requested for a 90 day supply. Please follow up at your earliest convenience.   SYNTHROID 150 MCG tablet [[id number]]  [facility] [address], [place], [place], [place] [address]

## 2019-10-08 ENCOUNTER — Encounter: Payer: Self-pay | Admitting: Family Medicine

## 2019-10-08 DIAGNOSIS — I517 Cardiomegaly: Secondary | ICD-10-CM | POA: Insufficient documentation

## 2019-10-08 DIAGNOSIS — I340 Nonrheumatic mitral (valve) insufficiency: Secondary | ICD-10-CM | POA: Insufficient documentation

## 2019-10-08 HISTORY — DX: Nonrheumatic mitral (valve) insufficiency: I34.0

## 2019-10-08 HISTORY — DX: Cardiomegaly: I51.7

## 2019-10-11 ENCOUNTER — Other Ambulatory Visit: Payer: Self-pay | Admitting: Gastroenterology

## 2019-10-11 ENCOUNTER — Ambulatory Visit (INDEPENDENT_AMBULATORY_CARE_PROVIDER_SITE_OTHER): Payer: Self-pay

## 2019-10-11 DIAGNOSIS — Z1159 Encounter for screening for other viral diseases: Secondary | ICD-10-CM

## 2019-10-12 LAB — SARS CORONAVIRUS 2 (TAT 6-24 HRS): SARS Coronavirus 2: NEGATIVE

## 2019-10-13 ENCOUNTER — Encounter: Payer: Self-pay | Admitting: Gastroenterology

## 2019-10-13 ENCOUNTER — Ambulatory Visit (AMBULATORY_SURGERY_CENTER): Payer: Self-pay | Admitting: Gastroenterology

## 2019-10-13 ENCOUNTER — Other Ambulatory Visit: Payer: Self-pay

## 2019-10-13 VITALS — BP 170/102 | HR 58 | Temp 96.9°F | Resp 17 | Ht 63.0 in | Wt 150.0 lb

## 2019-10-13 DIAGNOSIS — K219 Gastro-esophageal reflux disease without esophagitis: Secondary | ICD-10-CM

## 2019-10-13 DIAGNOSIS — K200 Eosinophilic esophagitis: Secondary | ICD-10-CM

## 2019-10-13 DIAGNOSIS — K228 Other specified diseases of esophagus: Secondary | ICD-10-CM

## 2019-10-13 MED ORDER — SODIUM CHLORIDE 0.9 % IV SOLN: 500.0000 mL | INTRAVENOUS | Status: DC

## 2019-10-13 MED ORDER — ESOMEPRAZOLE MAGNESIUM 40 MG PO CPDR: 40.0000 mg | DELAYED_RELEASE_CAPSULE | Freq: Every day | ORAL | 3 refills | Status: DC

## 2019-10-13 MED FILL — ESOMEPRAZOLE MAG DR 40 MG C: 40 | 30 days supply | Qty: 30 | Fill #0

## 2019-10-13 NOTE — Op Note (Signed)
[facility] Patient Name: Suzanne Cline Procedure Date: 10/13/2019 10:28 AM MRN: [id number] Endoscopist: Justice Britain , MD Age: 54 Referring MD:  Date of Birth: Jun 12, 1965 Gender: Female Account #: [id number] Procedure:                Upper GI endoscopy Indications:              Evaluation of esophageal eosinophilia previously                            noted on last EGD Medicines:                Monitored Anesthesia Care Procedure:                Pre-Anesthesia Assessment:                           - Prior to the procedure, a History and Physical                            was performed, and patient medications and                            allergies were reviewed. The patient's tolerance of                            previous anesthesia was also reviewed. The risks                            and benefits of the procedure and the sedation                            options and risks were discussed with the patient.                            All questions were answered, and informed consent                            was obtained. Prior Anticoagulants: The patient has                            taken no previous anticoagulant or antiplatelet                            agents. ASA Grade Assessment: II - A patient with                            mild systemic disease. After reviewing the risks                            and benefits, the patient was deemed in                            satisfactory condition to undergo the procedure.  After obtaining informed consent, the endoscope was                            passed under direct vision. Throughout the                            procedure, the patient's blood pressure, pulse, and                            oxygen saturations were monitored continuously. The                            Endoscope was introduced through the mouth, and                            advanced to the second part of  duodenum. The upper                            GI endoscopy was accomplished without difficulty.                            The patient tolerated the procedure. Scope In: Scope Out: Findings:                 No gross lesions were noted in the entire                            esophagus. Biopsies were taken from the                            proximal/middle esophagus with a cold forceps for                            histology to understand esophageal eosinophilia.                            Biopsies were taken from the distal esophagus with                            a cold forceps for histology to understand                            esophageal eosinophilia.                           Two dispersed diminutive erosions with no bleeding                            and no stigmata of recent bleeding were found in                            the gastric antrum.                           No other gross lesions were noted  in the entire                            examined stomach - previously biopsied and negative                            for HP.                           No gross lesions were noted in the duodenal bulb,                            in the first portion of the duodenum and in the                            second portion of the duodenum. Complications:            No immediate complications. Estimated Blood Loss:     Estimated blood loss was minimal. Impression:               - No gross lesions in esophagus. Biopsied.                           - Two erosions with no bleeding and no stigmata of                            recent bleeding. No other gross lesions in the                            stomach.                           - No gross lesions in the duodenal bulb, in the                            first portion of the duodenum and in the second                            portion of the duodenum. Recommendation:           - The patient will be observed post-procedure,                             until all discharge criteria are met.                           - Discharge patient to home.                           - Patient has a contact number available for                            emergencies. The signs and symptoms of potential                            delayed  complications were discussed with the                            patient. Return to normal activities tomorrow.                            Written discharge instructions were provided to the                            patient.                           - Resume previous diet.                           - Continue present medications.                           - We will do a 1 time PPI switch to see if any                            difference is noted in some of her symptoms - we                            will transition to Nexium once daily.                           - Recommend Carafate to try and further decrease                            this over the next month to off if possible. Would                            consider trying to do twice daily for 2-weeks and                            then once daily for 2-weeks and then stop.                           - Etiology of the patient's band-like discomfort                            seems to be less-likely GI in origin but rather                            MSK. Her bloating symptoms are improving but not                            gone completely. Recommend considering SIBO breath                            testing in future and that can be arranged with my  staff. She should be sure to not have any                            antibiotics within 4-weeks of doing the test.                           - We will see how the biopsies return of the                            esophagus. Her esophageal eosinophilia is not                            completely defined as true EoE, but we will see                            what has  happened in the setting of her high dose                            PPI therapy recently.                           - Follow up in clinic with PA Zehr or myself in                            6-weeks.                           - The findings and recommendations were discussed                            with the patient. Justice Britain, MD 10/13/2019 10:58:13 AM

## 2019-10-13 NOTE — Progress Notes (Signed)
A/ox3, pleased with MAC, report to RN 

## 2019-10-13 NOTE — Progress Notes (Signed)
V/s KW I have reviewed the patient's medical history in detail and updated the computerized patient record. 

## 2019-10-13 NOTE — Patient Instructions (Signed)
YOU HAD AN ENDOSCOPIC PROCEDURE TODAY AT THE [facility]:   Refer to the procedure report that was given to you for any specific questions about what was found during the examination.  If the procedure report does not answer your questions, please call your gastroenterologist to clarify.  If you requested that your care partner not be given the details of your procedure findings, then the procedure report has been included in a sealed envelope for you to review at your convenience later.  YOU SHOULD EXPECT: Some feelings of bloating in the abdomen. Passage of more gas than usual.  Walking can help get rid of the air that was put into your GI tract during the procedure and reduce the bloating. If you had a lower endoscopy (such as a colonoscopy or flexible sigmoidoscopy) you may notice spotting of blood in your stool or on the toilet paper. If you underwent a bowel prep for your procedure, you may not have a normal bowel movement for a few days.  Please Note:  You might notice some irritation and congestion in your nose or some drainage.  This is from the oxygen used during your procedure.  There is no need for concern and it should clear up in a day or so.  SYMPTOMS TO REPORT IMMEDIATELY:    Following upper endoscopy (EGD)  Vomiting of blood or coffee ground material  New chest pain or pain under the shoulder blades  Painful or persistently difficult swallowing  New shortness of breath  Fever of 100F or higher  Black, tarry-looking stools  For urgent or emergent issues, a gastroenterologist can be reached at any hour by calling [phone]. Do not use MyChart messaging for urgent concerns.    DIET:  We do recommend a small meal at first, but then you may proceed to your regular diet.  Drink plenty of fluids but you should avoid alcoholic beverages for 24 hours.  ACTIVITY:  You should plan to take it easy for the rest of today and you should NOT DRIVE or use heavy machinery  until tomorrow (because of the sedation medicines used during the test).    FOLLOW UP: Our staff will call the number listed on your records 48-72 hours following your procedure to check on you and address any questions or concerns that you may have regarding the information given to you following your procedure. If we do not reach you, we will leave a message.  We will attempt to reach you two times.  During this call, we will ask if you have developed any symptoms of COVID 19. If you develop any symptoms (ie: fever, flu-like symptoms, shortness of breath, cough etc.) before then, please call [phone].  If you test positive for Covid 19 in the 2 weeks post procedure, please call and report this information to us.    If any biopsies were taken you will be contacted by phone or by letter within the next 1-3 weeks.  Please call us at [phone] if you have not heard about the biopsies in 3 weeks.    SIGNATURES/CONFIDENTIALITY: You and/or your care partner have signed paperwork which will be entered into your electronic medical record.  These signatures attest to the fact that that the information above on your After Visit Summary has been reviewed and is understood.  Full responsibility of the confidentiality of this discharge information lies with you and/or your care-partner. 

## 2019-10-16 ENCOUNTER — Ambulatory Visit: Payer: Self-pay | Admitting: Family Medicine

## 2019-10-16 ENCOUNTER — Other Ambulatory Visit: Payer: Self-pay

## 2019-10-17 ENCOUNTER — Telehealth: Payer: Self-pay

## 2019-10-17 NOTE — Telephone Encounter (Signed)
Once biopsies results return follow up will be made. Thanks for keeping me UTD. GM

## 2019-10-17 NOTE — Telephone Encounter (Signed)
  Follow up Call-  Call back number 10/13/2019 07/06/2019  Post procedure Call Back phone  # [phone]  Permission to leave phone message Yes Yes     Patient questions:  Do you have a fever, pain , or abdominal swelling? Yes.   Pain Score  3 *the same pain that has been discussed before the procedure  Have you tolerated food without any problems? Yes.  pt states she doesn't have much of an appetite d/t the bloating but she still makes herself eat.  Have you been able to return to your normal activities? Yes.    Do you have any questions about your discharge instructions: Diet   No. Medications  Yes.  Doesn't feel the Nexium or the Carafate is truly making a difference.  RN informed patient that it can take approximately 2 weeks before the full effects of a medication.  Informed patient that this correspondence would be shared with MD. Follow up visit  Yes.   Patient asked about when she would be called to set up the follow up appointment and RN informed her that the MD office staff would be reaching out to her once the biopsy results are available.  Do you have questions or concerns about your Care? No.  Actions: * If pain score is 4 or above: No action needed, pain <4.  1. Have you developed a fever since your procedure? no  2.   Have you had an respiratory symptoms (SOB or cough) since your procedure? no  3.   Have you tested positive for COVID 19 since your procedure no  4.   Have you had any family members/close contacts diagnosed with the COVID 19 since your procedure?  no   If yes to any of these questions please route to Joylene John, RN and Erenest Rasher, RN

## 2019-10-18 ENCOUNTER — Encounter: Payer: Self-pay | Admitting: Gastroenterology

## 2019-10-18 ENCOUNTER — Ambulatory Visit (INDEPENDENT_AMBULATORY_CARE_PROVIDER_SITE_OTHER): Payer: Self-pay | Admitting: Obstetrics and Gynecology

## 2019-10-18 ENCOUNTER — Other Ambulatory Visit: Payer: Self-pay

## 2019-10-18 ENCOUNTER — Encounter: Payer: Self-pay | Admitting: Obstetrics and Gynecology

## 2019-10-18 DIAGNOSIS — D219 Benign neoplasm of connective and other soft tissue, unspecified: Secondary | ICD-10-CM

## 2019-10-18 NOTE — Progress Notes (Signed)
Suzanne Cline presents to discuss options for treatment of her uterine fibroids. See prior notes. U/S large 11 x 11 x 9 cm fibroid. She is s/p uterine artery embolization in 2016 at [facility]. She is postmenopausal and denies any bleeding. She does have abd/pelvic and back pain. Feels that pain is related to uterine.  Medical problems as noted in chart.  PE AF VSS Lungs clear Heart RRR Abd soft + BS abd/pelvic mass effect to umbilicus and deviated to the right GU NLl EGBUS enlarged uterus @ 20 weeks   A/P Large uterine fibroid        Abd/Pelvic/Back pain  ? Related to fibroid uterus  Unfortunately pt's opitons are limited to surgery. TAH with possible salpingectomy discussed with pt. R/B/Post op care reviewed. Will need pre op clearance prior to scheduling. Will schedule once obtained. Note to PCP for pre op clearance. F/U with post op appt.

## 2019-10-18 NOTE — Patient Instructions (Addendum)
Uterine Fibroids  Uterine fibroids are lumps of tissue (tumors) in your womb (uterus). They are not cancer (are benign). Most women with this condition do not need treatment. Sometimes fibroids can affect your ability to have children (your fertility). If that happens, you may need surgery to take out the fibroids. Follow these instructions at home:  Take over-the-counter and prescription medicines only as told by your doctor. Your doctor may suggest NSAIDs (such as aspirin or ibuprofen) to help with pain.  Ask your doctor if you should: ? Take iron pills. ? Eat more foods that have iron in them, such as dark green, leafy vegetables.  If directed, apply heat to your back or belly to reduce pain. Use the heat source that your doctor recommends, such as a moist heat pack or a heating pad. ? Put a towel between your skin and the heat source. ? Leave the heat on for 20-30 minutes. ? Remove the heat if your skin turns bright red. This is especially important if you are unable to feel pain, heat, or cold. You may have a greater risk of getting burned.  Pay close attention to your period (menstrual) cycles. Tell your doctor about any changes, such as: ? A heavier blood flow than usual. ? Needing to use more pads or tampons than normal. ? A change in how many days your period lasts. ? A change in symptoms that come with your period, such as cramps or back pain.  Keep all follow-up visits as told by your doctor. This is important. Your doctor may need to watch your fibroids over time for any changes. Contact a doctor if you:  Have pain that does not get better with medicine or heat, such as pain or cramps in: ? Your back. ? The area between your hip bones (pelvic area). ? Your belly.  Have new bleeding between your periods.  Have more bleeding during or between your periods.  Feel very tired or weak.  Feel light-headed. Get help right away if you:  Pass out (faint).  Have pain in the  area between your hip bones that suddenly gets worse.  Have bleeding that soaks a tampon or pad in 30 minutes or less. Summary  Uterine fibroids are lumps of tissue (tumors) in your womb (uterus). They are not cancer.  The only treatment that most women need is taking aspirin or ibuprofen for pain.  Contact a doctor if you have pain or cramps that do not get better with medicine.  Make sure you know what symptoms you should get help for right away. This information is not intended to replace advice given to you by your health care provider. Make sure you discuss any questions you have with your health care provider. Document Revised: 03/26/2017 Document Reviewed: 03/09/2017 Elsevier Patient Education  [place].  Abdominal Hysterectomy, Care After This sheet gives you information about how to care for yourself after your procedure. Your doctor may also give you more specific instructions. If you have problems or questions, contact your doctor. Follow these instructions at home: Bathing  Do not take baths, swim, or use a hot tub until your doctor says it is okay. Ask your doctor if you can take showers. You may only be allowed to take sponge baths for bathing.  Keep the bandage (dressing) dry until your doctor says it can be taken off. Surgical cut (incision) care      Follow instructions from your doctor about how to take care of  your cut from surgery. Make sure you: ? Wash your hands with soap and water before you change your bandage (dressing). If you cannot use soap and water, use hand sanitizer. ? Change your bandage as told by your doctor. ? Leave stitches (sutures), skin glue, or skin tape (adhesive) strips in place. They may need to stay in place for 2 weeks or longer. If tape strips get loose and curl up, you may trim the loose edges. Do not remove tape strips completely unless your doctor says it is okay.  Check your surgical cut area every day for signs of  infection. Check for: ? Redness, swelling, or pain. ? Fluid or blood. ? Warmth. ? Pus or a bad smell. Activity  Do gentle, daily exercise as told by your doctor. You may be told to take short walks every day and go farther each time.  Do not lift anything that is heavier than 10 lb (4.5 kg), or the limit that your doctor tells you, until he or she says that it is safe.  Do not drive or use heavy machinery while taking prescription pain medicine.  Do not drive for 24 hours if you were given a medicine to help you relax (sedative).  Follow your doctor's advice about exercise, driving, and general activities. Ask your doctor what activities are safe for you. Lifestyle   Do not douche, use tampons, or have sex for at least 6 weeks or as told by your doctor.  Do not drink alcohol until your doctor says it is okay.  Drink enough fluid to keep your pee (urine) clear or pale yellow.  Try to have someone at home with you for the first 1-2 weeks to help.  Do not use any products that contain nicotine or tobacco, such as cigarettes and e-cigarettes. These can slow down healing. If you need help quitting, ask your doctor. General instructions  Take over-the-counter and prescription medicines only as told by your doctor.  Do not take aspirin or ibuprofen. These medicines can cause bleeding.  To prevent or treat constipation while you are taking prescription pain medicine, your doctor may suggest that you: ? Drink enough fluid to keep your urine clear or pale yellow. ? Take over-the-counter or prescription medicines. ? Eat foods that are high in fiber, such as:  Fresh fruits and vegetables.  Whole grains.  Beans. ? Limit foods that are high in fat and processed sugars, such as fried and sweet foods.  Keep all follow-up visits as told by your doctor. This is important. Contact a doctor if:  You have chills or fever.  You have redness, swelling, or pain around your cut.  You have  fluid or blood coming from your cut.  Your cut feels warm to the touch.  You have pus or a bad smell coming from your cut.  Your cut breaks open.  You feel dizzy or light-headed.  You have pain or bleeding when you pee.  You keep having watery poop (diarrhea).  You keep feeling sick to your stomach (nauseous) or keep throwing up (vomiting).  You have unusual fluid (discharge) coming from your vagina.  You have a rash.  You have a reaction to your medicine.  Your pain medicine does not help. Get help right away if:  You have a fever and your symptoms get worse all of a sudden.  You have very bad belly (abdominal) pain.  You are short of breath.  You pass out (faint).  You have pain,  swelling, or redness of your leg.  You bleed a lot from your vagina and notice clumps of blood (clots). Summary  Do not take baths, swim, or use a hot tub until your doctor says it is okay. Ask your doctor if you can take showers. You may only be allowed to take sponge baths for bathing.  Follow your doctor's advice about exercise, driving, and general activities. Ask your doctor what activities are safe for you.  Do not lift anything that is heavier than 10 lb (4.5 kg), or the limit that your doctor tells you, until he or she says that it is safe.  Try to have someone at home with you for the first 1-2 weeks to help. This information is not intended to replace advice given to you by your health care provider. Make sure you discuss any questions you have with your health care provider. Document Revised: 06/16/2018 Document Reviewed: 04/01/2016 Elsevier Patient Education  [place].  Hysterectomy Information  A hysterectomy is a surgery to remove your uterus. After surgery, you will no longer have periods. Also, you will no longer be able to get pregnant. Reasons for this surgery You may have this surgery if:  You have bleeding in your vagina: ? That is not normal. ? That  does not stop, or that keeps coming back.  You have long-term (chronic) pain in your lower belly (pelvic area).  The lining of your uterus grows outside of the uterus (endometriosis).  The lining of your uterus grows in the muscle of the uterus (adenomyosis).  Your uterus falls down into your vagina (prolapse).  You have a growth in your uterus that causes problems (uterine fibroids).  You have cells that could turn into cancer (precancerous cells).  You have cancer of the uterus or cervix. Types of hysterectomies There are 3 types of hysterectomies. Depending on the type, the surgery will:  Remove the top part of the uterus (supracervical).  Remove the uterus and the cervix (total).  Remove the uterus, cervix, and tissue that holds the uterus in place (radical). Ways a hysterectomy can be done This surgery may be done in one of these ways:  A cut (incision) is made in the belly (abdomen). The uterus is taken out through the cut.  A cut is made in the vagina. The uterus is taken out through the cut.  Three or four cuts are made in the belly. A device with a camera is put through one of the cuts. The uterus is cut into pieces and taken out through the cuts or the vagina.  Three or four cuts are made in the belly. A device with a camera is put through one of the cuts. The uterus is taken out through the vagina.  Three or four cuts are made in the belly. A computer helps control the surgical tools. The uterus is cut into small pieces. The pieces are taken out through the cuts or through the vagina. Talk with your doctor about which way is best for you. Risks of hysterectomy Generally, this surgery is safe. However, problems can happen, including:  Bleeding.  Needing donated blood (transfusion).  Blood clots.  Infection.  Damage to other structures or organs.  Allergic reactions.  Needing to switch to a different type of surgery. What to expect after surgery  You  will be given pain medicine.  You will need to stay in the hospital for 1-2 days.  Follow your doctor's instructions about: ? Exercising. ?  Driving. ? What activities are safe for you.  You will need to have someone with you at home for 3-5 days.  You will need to see your doctor after 2-4 weeks.  You may get hot flashes, have night sweats, and have trouble sleeping.  You may need to have Pap tests if your surgery was related to cancer. Talk with your doctor about how often you need Pap tests. Questions to ask your doctor  Do I need this surgery? Do I have other treatment options?  What are my options for this surgery?  What needs to be removed?  What are the risks?  What are the benefits?  How long will I need to stay in the hospital?  How long will I need to recover?  What symptoms can I expect after the procedure? Summary  A hysterectomy is a surgery to remove your uterus. After surgery, you will no longer have periods. Also, you will no longer be able to get pregnant.  Talk with your doctor about which type of hysterectomy is best for you. This information is not intended to replace advice given to you by your health care provider. Make sure you discuss any questions you have with your health care provider. Document Revised: 06/16/2018 Document Reviewed: 07/14/2016 Elsevier Patient Education  [place].

## 2019-10-26 ENCOUNTER — Telehealth: Payer: Self-pay | Admitting: Licensed Clinical Social Worker

## 2019-10-26 NOTE — Telephone Encounter (Signed)
Call placed to patient regarding [facility] referral. LCSW introduced self and explained role at [facility]. Pt shared that she experiences financial strain with medical bills.   Pt is an active recipient to the [employer]. LCSW discussed process on renewing the CAFA and strongly encouraged her to contact Financial Counselor if medical bills are not being discounted appropriately.   Pt reports no other behavioral health or resource needs.

## 2019-11-10 ENCOUNTER — Other Ambulatory Visit: Payer: Self-pay | Admitting: Family Medicine

## 2019-11-10 ENCOUNTER — Other Ambulatory Visit: Payer: Self-pay

## 2019-11-10 MED ORDER — LOSARTAN POTASSIUM 100 MG PO TABS: 100.0000 mg | ORAL_TABLET | Freq: Every day | ORAL | 3 refills | Status: DC

## 2019-11-10 NOTE — Telephone Encounter (Signed)
Please refill if indicated! 

## 2019-11-14 ENCOUNTER — Other Ambulatory Visit: Payer: Self-pay

## 2019-11-14 ENCOUNTER — Ambulatory Visit: Payer: Self-pay | Attending: Internal Medicine | Admitting: Internal Medicine

## 2019-11-14 ENCOUNTER — Encounter: Payer: Self-pay | Admitting: Internal Medicine

## 2019-11-14 VITALS — BP 118/81 | HR 82 | Resp 16 | Wt 148.2 lb

## 2019-11-14 DIAGNOSIS — I110 Hypertensive heart disease with heart failure: Secondary | ICD-10-CM

## 2019-11-14 DIAGNOSIS — E119 Type 2 diabetes mellitus without complications: Secondary | ICD-10-CM

## 2019-11-14 DIAGNOSIS — E1165 Type 2 diabetes mellitus with hyperglycemia: Secondary | ICD-10-CM

## 2019-11-14 DIAGNOSIS — I5030 Unspecified diastolic (congestive) heart failure: Secondary | ICD-10-CM

## 2019-11-14 DIAGNOSIS — E890 Postprocedural hypothyroidism: Secondary | ICD-10-CM

## 2019-11-14 DIAGNOSIS — Z01818 Encounter for other preprocedural examination: Secondary | ICD-10-CM

## 2019-11-14 LAB — GLUCOSE, POCT (MANUAL RESULT ENTRY): POC Glucose: 393 mg/dl — AB (ref 70–99)

## 2019-11-14 MED ORDER — METFORMIN HCL 1000 MG PO TABS: 500.0000 mg | ORAL_TABLET | Freq: Two times a day (BID) | ORAL | 3 refills | Status: DC

## 2019-11-14 MED ORDER — GLIPIZIDE 10 MG PO TABS: 10.0000 mg | ORAL_TABLET | Freq: Two times a day (BID) | ORAL | 3 refills | Status: DC

## 2019-11-14 MED ORDER — INSULIN GLARGINE 100 UNITS/ML SOLOSTAR PEN: 20.0000 [IU] | PEN_INJECTOR | Freq: Every day | SUBCUTANEOUS | 4 refills | Status: DC

## 2019-11-14 MED ORDER — SYNTHROID 150 MCG PO TABS: 150.0000 ug | ORAL_TABLET | Freq: Every day | ORAL | 0 refills | Status: DC

## 2019-11-14 NOTE — Patient Instructions (Signed)
Please take your diabetic medications consistently as prescribed.  Decrease the Metformin to half a tablet daily of the 1000 mg.  Try to check your blood sugars at least once a day before breakfast.  The goal for blood sugars before meals is 90-130.

## 2019-11-14 NOTE — Progress Notes (Signed)
Patient ID: Suzanne Cline, female    DOB: 01-18-1966  MRN: [id number]  CC: surgical clearance and Medication Refill   Subjective: Suzanne Cline is a 54 y.o. female who presents for pre-op eval.  PCP is Dr. Chapman Fitch. Her concerns today include:  Patient with history of HTN, HL, DM type II, tobacco dependence, postsurgical hypothyroid, diastolic CHF, pulmonary nodule  Pt will be having hysterectomy No problems with anesthesia in past.   Hx of dCHF.  Recently evaluated by her cardiologist Dr. Ellyn Hack.  She had echo that revealed EF of 60 to 65% with no wall motion abnormalities, grade 1 diastolic dysfunction and moderate to severe mitral regurg.  Cardiologist does not recommend any further testing prior to surgery.  She reports compliance with her blood pressure medications.  No, CP, SOB, LE edema. Can walk 3-4 blocks.  Hypothyroid: Last TSH level was done in May and was 13.9.  Patient thinks she may have been out of the levothyroxine for a little bit around that time.  Since then she has been taking it consistently.      DM: Blood sugar today is nearly 400.  She tells me she does okay with her eating habits.  She is supposed to be on Lantus 20 units daily but tells me she takes only 16 units.  Also supposed to be on Metformin 1 g twice a day but states she takes it every other day because it upsets her stomach.  She is also supposed to be on glipizide 10 mg twice a day but tells me that she takes that every other day also.  She forgot to take Lantus last night and did not take any of her oral medications today.  She is not checking blood sugars.  She states that she can feel and tell when her blood sugars are high.      Patient Active Problem List   Diagnosis Date Noted  . Fibroids 10/18/2019  . Moderate mitral valve regurgitation 10/08/2019  . Mild concentric left ventricular hypertrophy (LVH) 10/08/2019  . Hypothyroid 09/21/2019  . Bloating 08/25/2019  . LUQ abdominal pain 08/25/2019  .  Esophageal eosinophilia 08/25/2019  . LLQ abdominal pain 06/29/2019  . Chronic constipation 06/29/2019  . H/O insulin dependent diabetes mellitus 06/29/2019  . Hyperlipidemia associated with type 2 diabetes mellitus ([place]) 06/10/2019  . Well woman exam with routine gynecological exam 03/30/2019  . Breast pain 03/30/2019  . Essential hypertension 03/02/2018  . Hypertensive heart disease with diastolic heart failure ([place]) 01/21/2017  . Postoperative hypothyroidism 01/21/2017  . Tobacco dependence 01/21/2017  . Type 2 diabetes mellitus without complication, with long-term current use of insulin ([place]) 01/21/2017     Current Outpatient Medications on File Prior to Visit  Medication Sig Dispense Refill  . atorvastatin (LIPITOR) 40 MG tablet Take 40 mg by mouth daily.    . carvedilol (COREG) 25 MG tablet Take 25 mg by mouth 2 (two) times daily with a meal.    . esomeprazole (NEXIUM) 40 MG capsule Take 1 capsule (40 mg total) by mouth daily. 30 capsule 3  . gabapentin (NEURONTIN) 300 MG capsule 2 pills (600mg ) twice per day and at bedtime for neuropathic pain (Patient taking differently: Take 600 mg by mouth 2 (two) times daily. ) 180 capsule 3  . losartan (COZAAR) 100 MG tablet Take 1 tablet (100 mg total) by mouth daily. 90 tablet 3  . spironolactone (ALDACTONE) 25 MG tablet Take 1 tablet (25 mg total) by mouth daily. [place]  tablet 1  . sucralfate (CARAFATE) 1 GM/10ML suspension Take 10 mLs (1 g total) by mouth 4 (four) times daily. 420 mL 2   No current facility-administered medications on file prior to visit.    Allergies  Allergen Reactions  . Lisinopril Cough  . Oxycodone Itching    Social History   Socioeconomic History  . Marital status: Single    Spouse name: Not on file  . Number of children: Not on file  . Years of education: Not on file  . Highest education level: Not on file  Occupational History  . Not on file  Tobacco Use  . Smoking status: Former Smoker    Types:  Cigars    Quit date: 03/2019    Years since quitting: 0.6  . Smokeless tobacco: Never Used  . Tobacco comment: smokes occasional cigar  Vaping Use  . Vaping Use: Never used  Substance and Sexual Activity  . Alcohol use: Yes    Comment: social  . Drug use: Never  . Sexual activity: Not Currently  Other Topics Concern  . Not on file  Social History Narrative  . Not on file   Social Determinants of Health   Financial Resource Strain:   . Difficulty of Paying Living Expenses:   Food Insecurity:   . Worried About Charity fundraiser in the Last Year:   . Arboriculturist in the Last Year:   Transportation Needs: Unmet Transportation Needs  . Lack of Transportation (Medical): Yes  . Lack of Transportation (Non-Medical): Yes  Physical Activity:   . Days of Exercise per Week:   . Minutes of Exercise per Session:   Stress:   . Feeling of Stress :   Social Connections:   . Frequency of Communication with Friends and Family:   . Frequency of Social Gatherings with Friends and Family:   . Attends Religious Services:   . Active Member of Clubs or Organizations:   . Attends Archivist Meetings:   Marland Kitchen Marital Status:   Intimate Partner Violence:   . Fear of Current or Ex-Partner:   . Emotionally Abused:   Marland Kitchen Physically Abused:   . Sexually Abused:     Family History  Problem Relation Age of Onset  . Congestive Heart Failure Mother   . Breast cancer Neg Hx   . Colon cancer Neg Hx   . Esophageal cancer Neg Hx   . Rectal cancer Neg Hx   . Stomach cancer Neg Hx   . Inflammatory bowel disease Neg Hx   . Liver disease Neg Hx   . Pancreatic cancer Neg Hx     Past Surgical History:  Procedure Laterality Date  . ABLATION     uterus  . COLONOSCOPY     2017 -  normal: in [place], [place]   . LEFT HEART CATH AND CORONARY ANGIOGRAPHY  05/19/2011   ([facility]): Normal coronary arteries  . THYROIDECTOMY    . TRANSTHORACIC ECHOCARDIOGRAM  07/18/2011   [facility] [place]: EF 35% with mild to moderate DD. -->  . TRANSTHORACIC ECHOCARDIOGRAM     [facility] [place]: EF 55%, G1 DD.  LVIDd 4.3; July 12, 2015: EF 55 to 60%.  Normal valves  . UPPER GASTROINTESTINAL ENDOSCOPY  07/06/2019    ROS: Review of Systems Negative except as stated above  PHYSICAL EXAM: BP 118/81   Pulse 82   Resp 16   Wt 148 lb 3.2 oz (67.2 kg)   SpO2 96%  BMI 26.25 kg/m   Physical Exam  General appearance - alert, well appearing, and in no distress Mental status - normal mood, behavior, speech, dress, motor activity, and thought processes Eyes - pupils equal and reactive, extraocular eye movements intact Nose - normal and patent, no erythema, discharge or polyps Mouth - mucous membranes moist, pharynx normal without lesions Neck - supple, no significant adenopathy Chest - clear to auscultation, no wheezes, rales or rhonchi, symmetric air entry Heart - normal rate, regular rhythm, normal S1, S2, no murmurs, rubs, clicks or gallops Abdomen - soft, nontender, nondistended, no masses or organomegaly Extremities - peripheral pulses normal, no pedal edema, no clubbing or cyanosis   CMP Latest Ref Rng & Units 09/01/2019 05/04/2019 04/24/2019  Glucose 65 - 99 mg/dL 101(H) 120(H) 78  BUN 6 - 24 mg/dL 10 13 21(H)  Creatinine 0.57 - 1.00 mg/dL 0.98 0.86 1.00  Sodium 134 - 144 mmol/L 142 143 137  Potassium 3.5 - 5.2 mmol/L 3.6 3.9 4.0  Chloride 96 - 106 mmol/L 101 100 100  CO2 20 - 29 mmol/L 24 28 26   Calcium 8.7 - 10.2 mg/dL 10.0 9.7 9.7  Total Protein 6.0 - 8.5 g/dL 7.1 7.1 7.3  Total Bilirubin 0.0 - 1.2 mg/dL 0.3 0.3 0.4  Alkaline Phos 39 - 117 IU/L 70 59 49  AST 0 - 40 IU/L 12 13 18   ALT 0 - 32 IU/L 9 8 15    Lipid Panel  No results found for: CHOL, TRIG, HDL, CHOLHDL, VLDL, LDLCALC, LDLDIRECT  CBC    Component Value Date/Time   WBC 7.5 09/01/2019 1629   WBC 7.8 04/24/2019 1823   RBC 4.68 09/01/2019 1629   RBC 4.44 04/24/2019 1823   HGB 13.7  09/01/2019 1629   HCT 41.3 09/01/2019 1629   PLT 236 09/01/2019 1629   MCV 88 09/01/2019 1629   MCH 29.3 09/01/2019 1629   MCH 28.4 04/24/2019 1823   MCHC 33.2 09/01/2019 1629   MCHC 32.3 04/24/2019 1823   RDW 13.3 09/01/2019 1629   LYMPHSABS 2.8 09/01/2019 1629   EOSABS 0.2 09/01/2019 1629   BASOSABS 0.0 09/01/2019 1629    ASSESSMENT AND PLAN: 1. Preoperative evaluation to rule out surgical contraindication -No further cardiac work-up needed per cardiology.  In terms of heart failure, she is currently compensated.  We will check A1c and TSH today.  If readings are good we can move forward with planned surgery.  2. Type 2 diabetes mellitus with hyperglycemia, without long-term current use of insulin ([place]) Advised patient of the importance of blood sugars being relatively well controlled going into surgery so that her surgical wound heals well. Dietary counseling given Encourage compliance with medications.  Since she is not able to tolerate Metformin at 1000 mg twice a day, I recommend decreasing it to 500 mg twice a day.  Advised to take the glipizide every day as prescribed.  Continue Lantus.  Encouraged her to check blood sugars before breakfast with goal being 90-130. - POCT glucose (manual entry) - insulin glargine (LANTUS) 100 unit/mL SOPN; Inject 0.2 mLs (20 Units total) into the skin daily.  Dispense: 15 mL; Refill: 4 - metFORMIN (GLUCOPHAGE) 1000 MG tablet; Take 0.5 tablets (500 mg total) by mouth 2 (two) times daily with a meal.  Dispense: 30 tablet; Refill: 3 - glipiZIDE (GLUCOTROL) 10 MG tablet; Take 1 tablet (10 mg total) by mouth 2 (two) times daily before a meal.  Dispense: 60 tablet; Refill: 3 - Hemoglobin A1c  3.  Hypertensive heart disease with diastolic heart failure (HCC) Blood pressure controlled.  4. Postoperative hypothyroidism Continue Synthroid. - SYNTHROID 150 MCG tablet; Take 1 tablet (150 mcg total) by mouth daily.  Dispense: 90 tablet; Refill: 0 -  TSH     Patient was given the opportunity to ask questions.  Patient verbalized understanding of the plan and was able to repeat key elements of the plan.   Orders Placed This Encounter  Procedures  . TSH  . Hemoglobin A1c  . POCT glucose (manual entry)     Requested Prescriptions   Signed Prescriptions Disp Refills  . SYNTHROID 150 MCG tablet 90 tablet 0    Sig: Take 1 tablet (150 mcg total) by mouth daily.  . insulin glargine (LANTUS) 100 unit/mL SOPN 15 mL 4    Sig: Inject 0.2 mLs (20 Units total) into the skin daily.  . metFORMIN (GLUCOPHAGE) 1000 MG tablet 30 tablet 3    Sig: Take 0.5 tablets (500 mg total) by mouth 2 (two) times daily with a meal.  . glipiZIDE (GLUCOTROL) 10 MG tablet 60 tablet 3    Sig: Take 1 tablet (10 mg total) by mouth 2 (two) times daily before a meal.    No follow-ups on file.  Karle Plumber, MD, FACP

## 2019-11-15 ENCOUNTER — Other Ambulatory Visit: Payer: Self-pay | Admitting: Internal Medicine

## 2019-11-15 DIAGNOSIS — E1165 Type 2 diabetes mellitus with hyperglycemia: Secondary | ICD-10-CM

## 2019-11-15 LAB — HEMOGLOBIN A1C
Est. average glucose Bld gHb Est-mCnc: 223 mg/dL
Hgb A1c MFr Bld: 9.4 % — ABNORMAL HIGH (ref 4.8–5.6)

## 2019-11-15 LAB — TSH: TSH: 3.59 u[IU]/mL (ref 0.450–4.500)

## 2019-11-15 MED ORDER — TRUE METRIX METER W/DEVICE KIT: PACK | 0 refills | Status: DC

## 2019-11-15 MED ORDER — TRUEPLUS LANCETS 28G MISC: 4 refills | Status: DC

## 2019-11-15 MED ORDER — TRUE METRIX BLOOD GLUCOSE TEST VI STRP: ORAL_STRIP | 12 refills | Status: DC

## 2019-11-15 MED FILL — !TRUE METRIX BLOOD GLUCOSE: 1 days supply | Qty: 1 | Fill #0

## 2019-11-15 MED FILL — $LANTUS SOLOSTAR 100 UNITS/: 100 | 90 days supply | Qty: 18 | Fill #1

## 2019-11-15 MED FILL — TRUE METRIX TEST STRIP: 25 days supply | Qty: 100 | Fill #0

## 2019-11-15 MED FILL — TRUEplus LANCETS 28G MISC: 25 days supply | Qty: 100 | Fill #0

## 2019-11-15 NOTE — Progress Notes (Signed)
I have sent patient her results via [place].  Her A1c is 9.4 compared to 6.32 months ago.  Her diabetes is not controlled.  We need to get the blood sugars better before she has hysterectomy.  I recommend that she check her blood sugars 1-2 times a day before meals and bring in her readings in 2 weeks to see [facility].  If BS look better at that time, then we can approve her for the surgery.  Please schedule an appointment for her to see Lurena Joiner at that time.  I have sent a prescription to the pharmacy for new glucometer and testing supplies.

## 2019-11-16 ENCOUNTER — Telehealth: Payer: Self-pay

## 2019-11-16 NOTE — Telephone Encounter (Signed)
Contacted pt to go over lab results pt is aware and doesn't have any questions or concerns 

## 2019-11-22 ENCOUNTER — Other Ambulatory Visit: Payer: Self-pay

## 2019-11-22 ENCOUNTER — Other Ambulatory Visit: Payer: Self-pay | Admitting: Family Medicine

## 2019-11-22 ENCOUNTER — Ambulatory Visit: Payer: Self-pay | Attending: Family Medicine

## 2019-11-22 DIAGNOSIS — E1142 Type 2 diabetes mellitus with diabetic polyneuropathy: Secondary | ICD-10-CM

## 2019-11-22 DIAGNOSIS — E1165 Type 2 diabetes mellitus with hyperglycemia: Secondary | ICD-10-CM

## 2019-11-22 DIAGNOSIS — I10 Essential (primary) hypertension: Secondary | ICD-10-CM

## 2019-11-22 MED ORDER — INSULIN GLARGINE 100 UNITS/ML SOLOSTAR PEN: 20.0000 [IU] | PEN_INJECTOR | Freq: Every day | SUBCUTANEOUS | 2 refills | Status: DC

## 2019-11-22 MED ORDER — SPIRONOLACTONE 25 MG PO TABS: 25.0000 mg | ORAL_TABLET | Freq: Every day | ORAL | 2 refills | Status: DC

## 2019-11-22 MED FILL — ?SPIRONOLACTONE 25 MG TABLE: 25 | 30 days supply | Qty: 30 | Fill #0

## 2019-11-22 NOTE — Telephone Encounter (Signed)
Rx for Lantus and spironolactone sent. Gabapentin request forwarded to provider covering for Dr. Chapman Fitch.

## 2019-11-22 NOTE — Telephone Encounter (Signed)
lantus & gabapentin & spironolactome pt says she asked for 11/14/19. CHW please send. Pt is here today 8:30 financial appt.

## 2019-11-23 ENCOUNTER — Other Ambulatory Visit: Payer: Self-pay | Admitting: Internal Medicine

## 2019-11-23 MED ORDER — GABAPENTIN 300 MG PO CAPS: 600.0000 mg | ORAL_CAPSULE | Freq: Two times a day (BID) | ORAL | 1 refills | Status: DC

## 2019-11-23 MED FILL — GABAPENTIN 300 MG CAPSULE: 300 | 30 days supply | Qty: 120 | Fill #0

## 2019-11-28 MED FILL — TRUE METRIX GO GLUCOSE METE: W/DEVICE | 1 days supply | Qty: 1 | Fill #0

## 2019-11-28 MED FILL — TRUEplus LANCETS 28G MISC: 25 days supply | Qty: 100 | Fill #0

## 2019-11-28 MED FILL — $LANTUS SOLOSTAR 100 UNITS/: 100 | 90 days supply | Qty: 18 | Fill #0

## 2019-11-28 MED FILL — TRUE METRIX TEST STRIP: 25 days supply | Qty: 100 | Fill #0

## 2019-11-28 MED FILL — ESOMEPRAZOLE MAG DR 40 MG C: 40 | 30 days supply | Qty: 30 | Fill #1

## 2019-11-29 NOTE — Progress Notes (Signed)
S:  Patient arrives in no acute distress. Presents for diabetes evaluation, education, and management.  Patient was referred on 11/14/19 by Dr. Wynetta Emery from pre-op eval visit.  Patient was last seen by PCP on 09/18/19.   Pertinent PMH: HTN, HLD, tobacco use  Insurance coverage/medication affordability: patient assistance, out of pocket  Current diabetes medications:  1. metformin 1000 mg tab 1/2 tab BID  - tried 1/2 tab BID, unable to tolerate adverse effects  - stopped taking 2 days ago 2. glipizide 10 mg BID  - only takes with metformin d/t being told "boosts" metformin  - stopped taking 2 days ago 3. Insulin glargine 20 units at bedtime  - rationing insulin d/t low supply waiting for refill  - 10 units last night, was taking every other day or lower doses  - normal dose: 16 units daily  Adverse effects: diarrhea and GI upset with metformin. Denies any other adverse effects.  Medication adherence: noted above in med list   Other diabetes med trials: none  Hypoglycemia: - Once in last month. Sx: sweating, not feeling well - Unknown BG, does not test - Treats with: sugar bubble gum or hard candy  Patient reported dietary habits: Starting plant based diet  Exercise: walks 3x/week for at least 30 minues  DM Comorbidities: - Neuropathy: per pt, yes when A1c was very high, none lately - last DFE: 2019 - Retinopathy: no issues per pt during last eye exam (date unknown) - Gastroparesis: yes, not new for her, pt thinks may be related to GI effects of metformin - MCR: 44.6 on 11/30/2017, on ARB   Current hypertension medications include: losartan 100 mg daily, carvedilol 25 mg BID, spironolactone 25 mg daily Current hyperlipidemia medications include: atorvastatin 40 mg  O:  A1c: 9.4 (11/14/19) POCT BG: 148 (11/30/19 - fasting)  Home BG Readings Does not check BG at home, has supplies but does not feel need to test   Lipid Panel: none available  Clinical Atherosclerotic  Cardiovascular Disease (ASCVD): No. Unable to calculate 10 year ASCVD risk due to no FLP.   A: 1. Diabetes - Most recent A1c not at goal < 7%, next due 01/2020 - Clinic fasting BG today not at goal of 90-130 for surgery clearance - No home BG readings to assess - One episode of hypoglycemia, discussed appropriate treatment - Nonadherent d/t adverse effects and lack of refills leading to uncontrolled BG. Discussed mechanism of action for glipizide and rationale for use without metformin. - Would benefit from resuming insulin and sulfonyurea to reach FBG goal - Pt interested in improving lifestyle modifications, consider further discussion of diet and exercise goals - Future considerations: trial of metformin ER (discussed w/ pt)  2. ASCVD: primary prevention - Last LDL unknown, overdue for FLP, consider at f/u visit - On high intensity statin - Aspirin is not indicated  Plan: - Stop metformin - Continue insulin glargine 16 units (refilled) - Restart glipizide 10 mg BID - Check FBG once every few days, try to check some post-prandial BG, write in log and bring to next visit - F/U Pharmacist Clinic Visit in 3 weeks  Fara Olden, PharmD PGY-1 [place] Resident 11/30/2019 3:21 PM   Benard Halsted, PharmD, [place] [phone]

## 2019-11-30 ENCOUNTER — Ambulatory Visit: Payer: Self-pay | Attending: Family Medicine | Admitting: Pharmacist

## 2019-11-30 ENCOUNTER — Other Ambulatory Visit: Payer: Self-pay

## 2019-11-30 DIAGNOSIS — E1165 Type 2 diabetes mellitus with hyperglycemia: Secondary | ICD-10-CM

## 2019-11-30 LAB — GLUCOSE, POCT (MANUAL RESULT ENTRY): POC Glucose: 148 mg/dl — AB (ref 70–99)

## 2019-11-30 MED ORDER — INSULIN GLARGINE 100 UNITS/ML SOLOSTAR PEN: 16.0000 [IU] | PEN_INJECTOR | Freq: Every day | SUBCUTANEOUS | 2 refills | Status: DC

## 2019-11-30 MED FILL — ?SPIRONOLACTONE 25 MG TABLE: 25 | 30 days supply | Qty: 30 | Fill #0

## 2019-11-30 MED FILL — GABAPENTIN 300 MG CAPSULE: 300 | 30 days supply | Qty: 120 | Fill #0

## 2019-12-01 ENCOUNTER — Encounter: Payer: Self-pay | Admitting: Pharmacist

## 2019-12-26 ENCOUNTER — Ambulatory Visit: Payer: Self-pay | Admitting: Pharmacist

## 2019-12-27 MED FILL — ?SPIRONOLACTONE 25 MG TABLE: 25 | 30 days supply | Qty: 30 | Fill #1

## 2019-12-27 MED FILL — ESOMEPRAZOLE MAG DR 40 MG C: 40 | 30 days supply | Qty: 30 | Fill #2

## 2020-01-24 ENCOUNTER — Telehealth: Payer: Self-pay | Admitting: Family Medicine

## 2020-01-24 MED ORDER — CARVEDILOL 25 MG PO TABS: 25.0000 mg | ORAL_TABLET | Freq: Two times a day (BID) | ORAL | 2 refills | Status: DC

## 2020-01-24 NOTE — Telephone Encounter (Signed)
1) Medication(s) Requested (by name): carvedilol (COREG) 25 MG tablet   2) Pharmacy of Choice: [facility] pharmacy    3) Special Requests:   Approved medications will be sent to the pharmacy, we will reach out if there is an issue.  Requests made after 3pm may not be addressed until the following business day!  If a patient is unsure of the name of the medication(s) please note and ask patient to call back when they are able to provide all info, do not send to responsible party until all information is available!

## 2020-01-24 NOTE — Telephone Encounter (Signed)
Copied from [place] [id number]. Topic: General - Other >> Jan 24, 2020 11:00 AM Leward Quan A wrote: Reason for CRM: Patient called to say that she is out of this carvedilol (COREG) 25 MG tablet  medication and that she need her 90 day supply called in today please [facility] [place], [place] [address] [place] [address] Phone: [phone] Fax: [phone] .     Please call patient at Ph# [phone] when done

## 2020-01-24 NOTE — Telephone Encounter (Signed)
Rx sent 

## 2020-01-25 MED ORDER — CARVEDILOL 25 MG PO TABS: 25.0000 mg | ORAL_TABLET | Freq: Two times a day (BID) | ORAL | 0 refills | Status: DC

## 2020-01-25 MED ORDER — CARVEDILOL 25 MG PO TABS: 25.0000 mg | ORAL_TABLET | Freq: Two times a day (BID) | ORAL | 2 refills | Status: DC

## 2020-01-25 MED FILL — ?CARVEDILOL 25 MG TABLET: 25 | 30 days supply | Qty: 60 | Fill #0

## 2020-01-25 NOTE — Telephone Encounter (Signed)
Rx sent 

## 2020-01-29 MED FILL — ESOMEPRAZOLE MAG DR 40 MG C: 40 | 30 days supply | Qty: 30 | Fill #3

## 2020-01-29 MED FILL — GABAPENTIN 300 MG CAPSULE: 300 | 30 days supply | Qty: 120 | Fill #1

## 2020-02-05 ENCOUNTER — Other Ambulatory Visit: Payer: Self-pay | Admitting: Gastroenterology

## 2020-02-05 MED FILL — ?SPIRONOLACTONE 25 MG TABLE: 25 | 30 days supply | Qty: 30 | Fill #2

## 2020-02-06 MED FILL — ESOMEPRAZOLE MAG DR 40 MG C: 40 | 30 days supply | Qty: 30 | Fill #0

## 2020-02-15 ENCOUNTER — Telehealth: Payer: Self-pay | Admitting: Family Medicine

## 2020-02-15 ENCOUNTER — Ambulatory Visit: Payer: Self-pay | Admitting: Family Medicine

## 2020-03-01 ENCOUNTER — Other Ambulatory Visit: Payer: Self-pay | Admitting: Family Medicine

## 2020-03-01 ENCOUNTER — Encounter: Payer: Self-pay | Admitting: Family Medicine

## 2020-03-01 DIAGNOSIS — E890 Postprocedural hypothyroidism: Secondary | ICD-10-CM

## 2020-03-01 DIAGNOSIS — I10 Essential (primary) hypertension: Secondary | ICD-10-CM

## 2020-03-01 DIAGNOSIS — K259 Gastric ulcer, unspecified as acute or chronic, without hemorrhage or perforation: Secondary | ICD-10-CM

## 2020-03-01 DIAGNOSIS — K2970 Gastritis, unspecified, without bleeding: Secondary | ICD-10-CM

## 2020-03-01 DIAGNOSIS — E1165 Type 2 diabetes mellitus with hyperglycemia: Secondary | ICD-10-CM

## 2020-03-01 MED ORDER — GLIPIZIDE 10 MG PO TABS: 10.0000 mg | ORAL_TABLET | Freq: Two times a day (BID) | ORAL | 0 refills | Status: DC

## 2020-03-01 MED ORDER — SYNTHROID 150 MCG PO TABS: 150.0000 ug | ORAL_TABLET | Freq: Every day | ORAL | 0 refills | Status: DC

## 2020-03-01 MED ORDER — LOSARTAN POTASSIUM 100 MG PO TABS: 100.0000 mg | ORAL_TABLET | Freq: Every day | ORAL | 0 refills | Status: DC

## 2020-03-01 NOTE — Progress Notes (Signed)
Patient ID: Suzanne Cline, female   DOB: October 13, 1965, 54 y.o.   MRN: [id number]   Patient left my chart message regarding refills of Synthroid, losartan and glipizide.  She reports that her recent request for 90-day refills were not approved.  Patient was seen in the office and reviewed her chart in July 2021 and 90-day supplies as requested medications will be sent to her pharmacy-[facility]..

## 2020-03-04 ENCOUNTER — Ambulatory Visit: Payer: Self-pay | Admitting: Family Medicine

## 2020-03-05 ENCOUNTER — Other Ambulatory Visit: Payer: Self-pay | Admitting: Family Medicine

## 2020-03-05 DIAGNOSIS — K2100 Gastro-esophageal reflux disease with esophagitis, without bleeding: Secondary | ICD-10-CM

## 2020-03-05 MED ORDER — ESOMEPRAZOLE MAGNESIUM 40 MG PO CPDR: 40.0000 mg | DELAYED_RELEASE_CAPSULE | Freq: Every day | ORAL | 0 refills | Status: DC

## 2020-03-05 NOTE — Progress Notes (Signed)
Patient ID: Suzanne Cline, female   DOB: 1966-03-05, 54 y.o.   MRN: [id number]   Patient request refill of omeprazole to the [facility] pharmacy

## 2020-03-06 ENCOUNTER — Other Ambulatory Visit: Payer: Self-pay | Admitting: Family Medicine

## 2020-03-06 DIAGNOSIS — K2100 Gastro-esophageal reflux disease with esophagitis, without bleeding: Secondary | ICD-10-CM

## 2020-03-06 DIAGNOSIS — K200 Eosinophilic esophagitis: Secondary | ICD-10-CM

## 2020-03-06 NOTE — Progress Notes (Signed)
Patient ID: Suzanne Cline, female   DOB: 22-Oct-1965, 54 y.o.   MRN: [id number]   Patient would like a referral to GI group other than [facility] in follow-up of her diagnosis of eosinophillic esophagitis. She is aware that she will have to pay out of pocket for the cost of the new referral

## 2020-03-08 MED FILL — ESOMEPRAZOLE MAG DR 40 MG C: 40 | 30 days supply | Qty: 30 | Fill #0

## 2020-03-18 ENCOUNTER — Other Ambulatory Visit: Payer: Self-pay | Admitting: Family Medicine

## 2020-03-18 ENCOUNTER — Other Ambulatory Visit: Payer: Self-pay | Admitting: Critical Care Medicine

## 2020-03-18 DIAGNOSIS — E890 Postprocedural hypothyroidism: Secondary | ICD-10-CM

## 2020-03-18 DIAGNOSIS — E1165 Type 2 diabetes mellitus with hyperglycemia: Secondary | ICD-10-CM

## 2020-03-18 DIAGNOSIS — I10 Essential (primary) hypertension: Secondary | ICD-10-CM

## 2020-03-18 MED ORDER — SYNTHROID 150 MCG PO TABS: 150.0000 ug | ORAL_TABLET | Freq: Every day | ORAL | 0 refills | Status: DC

## 2020-03-18 MED ORDER — LOSARTAN POTASSIUM 100 MG PO TABS: 100.0000 mg | ORAL_TABLET | Freq: Every day | ORAL | 0 refills | Status: DC

## 2020-03-18 MED ORDER — SUCRALFATE 1 GM/10ML PO SUSP: 1.0000 g | Freq: Four times a day (QID) | ORAL | 2 refills | Status: DC

## 2020-03-18 MED ORDER — CARVEDILOL 25 MG PO TABS: 25.0000 mg | ORAL_TABLET | Freq: Two times a day (BID) | ORAL | 0 refills | Status: DC

## 2020-03-18 MED ORDER — GLIPIZIDE 10 MG PO TABS: 10.0000 mg | ORAL_TABLET | Freq: Two times a day (BID) | ORAL | 0 refills | Status: DC

## 2020-03-18 MED ORDER — SPIRONOLACTONE 25 MG PO TABS: 25.0000 mg | ORAL_TABLET | Freq: Every day | ORAL | 2 refills | Status: DC

## 2020-03-18 MED FILL — ?SPIRONOLACTONE 25 MG TABLE: 25 | 30 days supply | Qty: 30 | Fill #0

## 2020-03-18 MED FILL — CARAFATE 1 GM/10 ML SUSP: 1 | 10 days supply | Qty: 420 | Fill #0

## 2020-03-19 ENCOUNTER — Other Ambulatory Visit: Payer: Self-pay | Admitting: Family Medicine

## 2020-03-19 ENCOUNTER — Telehealth: Payer: Self-pay | Admitting: Family Medicine

## 2020-03-19 NOTE — Telephone Encounter (Signed)
Copied from [place] [id number]. Topic: Referral - Status >> Mar 19, 2020 10:34 AM Yvette Rack wrote: Reason for CRM: Suzanne [facility] with [facility] stated they do not participate with the financial assistance program. Suzanne Cline requests call back to advise if referral should be closed on their end. Cb# [phone] Ext. [phone]

## 2020-03-19 NOTE — Telephone Encounter (Signed)
I send  A message to  [facility] thru [employer] . Patient is willing to pay out her pocket for a second opinion . Thank you

## 2020-04-02 ENCOUNTER — Other Ambulatory Visit: Payer: Self-pay | Admitting: Critical Care Medicine

## 2020-04-02 ENCOUNTER — Encounter: Payer: Self-pay | Admitting: Critical Care Medicine

## 2020-04-02 ENCOUNTER — Other Ambulatory Visit: Payer: Self-pay

## 2020-04-02 ENCOUNTER — Ambulatory Visit: Payer: Self-pay | Attending: Critical Care Medicine | Admitting: Critical Care Medicine

## 2020-04-02 VITALS — BP 154/86 | HR 60 | Ht 63.0 in | Wt 151.6 lb

## 2020-04-02 DIAGNOSIS — I110 Hypertensive heart disease with heart failure: Secondary | ICD-10-CM

## 2020-04-02 DIAGNOSIS — Z114 Encounter for screening for human immunodeficiency virus [HIV]: Secondary | ICD-10-CM

## 2020-04-02 DIAGNOSIS — I10 Essential (primary) hypertension: Secondary | ICD-10-CM

## 2020-04-02 DIAGNOSIS — D219 Benign neoplasm of connective and other soft tissue, unspecified: Secondary | ICD-10-CM

## 2020-04-02 DIAGNOSIS — E1169 Type 2 diabetes mellitus with other specified complication: Secondary | ICD-10-CM

## 2020-04-02 DIAGNOSIS — Z1159 Encounter for screening for other viral diseases: Secondary | ICD-10-CM

## 2020-04-02 DIAGNOSIS — Z794 Long term (current) use of insulin: Secondary | ICD-10-CM

## 2020-04-02 DIAGNOSIS — E1142 Type 2 diabetes mellitus with diabetic polyneuropathy: Secondary | ICD-10-CM

## 2020-04-02 DIAGNOSIS — Z1231 Encounter for screening mammogram for malignant neoplasm of breast: Secondary | ICD-10-CM

## 2020-04-02 DIAGNOSIS — E785 Hyperlipidemia, unspecified: Secondary | ICD-10-CM

## 2020-04-02 DIAGNOSIS — I5030 Unspecified diastolic (congestive) heart failure: Secondary | ICD-10-CM

## 2020-04-02 DIAGNOSIS — E1165 Type 2 diabetes mellitus with hyperglycemia: Secondary | ICD-10-CM

## 2020-04-02 DIAGNOSIS — K5909 Other constipation: Secondary | ICD-10-CM

## 2020-04-02 DIAGNOSIS — E119 Type 2 diabetes mellitus without complications: Secondary | ICD-10-CM

## 2020-04-02 DIAGNOSIS — Z87891 Personal history of nicotine dependence: Secondary | ICD-10-CM

## 2020-04-02 DIAGNOSIS — K2100 Gastro-esophageal reflux disease with esophagitis, without bleeding: Secondary | ICD-10-CM

## 2020-04-02 DIAGNOSIS — E890 Postprocedural hypothyroidism: Secondary | ICD-10-CM

## 2020-04-02 DIAGNOSIS — Z1211 Encounter for screening for malignant neoplasm of colon: Secondary | ICD-10-CM

## 2020-04-02 LAB — POCT GLYCOSYLATED HEMOGLOBIN (HGB A1C): Hemoglobin A1C: 6.1 % — AB (ref 4.0–5.6)

## 2020-04-02 LAB — GLUCOSE, POCT (MANUAL RESULT ENTRY): POC Glucose: 78 mg/dl (ref 70–99)

## 2020-04-02 MED ORDER — CARVEDILOL 25 MG PO TABS: 25.0000 mg | ORAL_TABLET | Freq: Two times a day (BID) | ORAL | 3 refills | Status: DC

## 2020-04-02 MED ORDER — EMPAGLIFLOZIN 10 MG PO TABS: 10.0000 mg | ORAL_TABLET | Freq: Every day | ORAL | 6 refills | Status: DC

## 2020-04-02 MED ORDER — CARVEDILOL 25 MG PO TABS
25.0000 mg | ORAL_TABLET | Freq: Two times a day (BID) | ORAL | 0 refills | Status: DC
Start: 2020-04-02 — End: 2020-04-30

## 2020-04-02 MED ORDER — GABAPENTIN 300 MG PO CAPS: 600.0000 mg | ORAL_CAPSULE | Freq: Two times a day (BID) | ORAL | 1 refills | Status: DC

## 2020-04-02 MED ORDER — SPIRONOLACTONE 25 MG PO TABS: 25.0000 mg | ORAL_TABLET | Freq: Every day | ORAL | 2 refills | Status: DC

## 2020-04-02 MED ORDER — ESOMEPRAZOLE MAGNESIUM 40 MG PO CPDR: 40.0000 mg | DELAYED_RELEASE_CAPSULE | Freq: Two times a day (BID) | ORAL | 1 refills | Status: DC

## 2020-04-02 MED ORDER — ATORVASTATIN CALCIUM 40 MG PO TABS: 40.0000 mg | ORAL_TABLET | Freq: Every day | ORAL | 1 refills | Status: DC

## 2020-04-02 MED ORDER — SENNOSIDES-DOCUSATE SODIUM 8.6-50 MG PO TABS: 1.0000 | ORAL_TABLET | Freq: Every day | ORAL | 6 refills | Status: DC

## 2020-04-02 MED ORDER — INSULIN GLARGINE 100 UNITS/ML SOLOSTAR PEN: 20.0000 [IU] | PEN_INJECTOR | Freq: Every day | SUBCUTANEOUS | 2 refills | Status: DC

## 2020-04-02 NOTE — Assessment & Plan Note (Signed)
Continue atorvastatin refills given

## 2020-04-02 NOTE — Progress Notes (Signed)
2Thinks she has bacteria in stomach, a lot of discomfort Tightness in upper stomach  Brown spots on breast

## 2020-04-02 NOTE — Assessment & Plan Note (Signed)
Type 2 diabetes at goal now with hemoglobin A1c of 6.1  We will plan to discontinue glipizide continue Lantus but increase to 20 units daily and begin Jardiance 10 mg daily

## 2020-04-02 NOTE — Progress Notes (Signed)
Subjective:    Patient ID: Suzanne Cline, female    DOB: Oct 09, 1965, 54 y.o.   MRN: [id number]  54 y.o.F PCP to est former pt of dr Chapman Fitch Hx of HFpEF, HTN, MVR, T2DM, HLD, tobacco,GERD with reflux esophagitis without bleeding on 09/2019 EGD with [facility]  This patient is seen in follow-up and to establish primary care.  The patient formally was a patient of Dr. Chapman Fitch who is no longer in the practice and the patient is here now for the first visit with me.  Patient history dates back and that she has hypertension, heart failure with preserved ejection fraction EF 65% with thickened left ventricle and diastolic heart failure, mitral regurgitation, type 2 diabetes, hyperlipidemia, tobacco use, reflux disease with recent upper endoscopy biopsy showing reflux esophagitis only   Patient has longstanding issues of upper abdominal pain left upper quadrant and epigastric pain she has constipation with this as well it feels like a tightness in the epigastric area and radiates into the back.  She has been taking once daily Nexium without much improvement in the symptoms.  She has 1 bowel movement every 3 to 4 days.  She does vape CBD oil but is no longer smoking cigarettes.  Patient is on thyroid supplementation at this time.  Note on arrival hemoglobin A1c was 6.1.  She is on the Lantus 16 units daily and also glipizide twice daily.  Blood sugar on arrival today was 78.  She notes she misses meals on several occasions.  Note this patient needs a mammogram hepatitis C and HIV screening.  She also needs colon cancer screening.  Note this patient is self-pay.  This patient also has a history of yet to receive Covid vaccine and is also refusing flu vaccine at this visit.  Patient still has a lot of misinformation and fears about the Covid vaccine that she states.  Note the patient does not check her blood sugar on a regular basis.  She will miss many meals.  The patient does get refills from [facility]  program and is requesting many of her chronic medications be sent to that location with 90-day refills     Past Medical History:  Diagnosis Date  . CHF (congestive heart failure) ([place])   . Diabetes mellitus without complication ([place])   . Fibroids, intramural    s/p uterine artery embolization  . Former cigarette smoker 04/2011  . History of Resolved Nonischemic Congestive Cardiomyopathy ([place]) 04/2011   Echo March 2013: EF 35% with mild/moderate diastolic dysfunction -> Follow-Up Echo March 2014 EF 55% with G1 DD.  Marland Kitchen Hypertension   . Hypertensive heart disease    Difficult control hypertension  . Mild concentric left ventricular hypertrophy (LVH) 10/08/2019  . Moderate mitral valve regurgitation 10/08/2019   Echo done June 2021; Dr. Ellyn Hack  . Neuromuscular disorder ([place])    diabetic neuropathy  . OSA (obstructive sleep apnea)   . Sleep apnea    doesn't use cpap regularly  . Thyroid disease    post thyroidectomy     Family History  Problem Relation Age of Onset  . Congestive Heart Failure Mother   . Breast cancer Neg Hx   . Colon cancer Neg Hx   . Esophageal cancer Neg Hx   . Rectal cancer Neg Hx   . Stomach cancer Neg Hx   . Inflammatory bowel disease Neg Hx   . Liver disease Neg Hx   . Pancreatic cancer Neg Hx  Social History   Socioeconomic History  . Marital status: Single    Spouse name: Not on file  . Number of children: Not on file  . Years of education: Not on file  . Highest education level: Not on file  Occupational History  . Not on file  Tobacco Use  . Smoking status: Former Smoker    Types: Cigars    Quit date: 03/2019    Years since quitting: 1.0  . Smokeless tobacco: Never Used  . Tobacco comment: smokes occasional cigar  Vaping Use  . Vaping Use: Never used  Substance and Sexual Activity  . Alcohol use: Yes    Comment: social  . Drug use: Never  . Sexual activity: Not Currently  Other Topics Concern  . Not on file  Social History  Narrative  . Not on file   Social Determinants of Health   Financial Resource Strain:   . Difficulty of Paying Living Expenses: Not on file  Food Insecurity:   . Worried About Charity fundraiser in the Last Year: Not on file  . Ran Out of Food in the Last Year: Not on file  Transportation Needs:   . Lack of Transportation (Medical): Not on file  . Lack of Transportation (Non-Medical): Not on file  Physical Activity:   . Days of Exercise per Week: Not on file  . Minutes of Exercise per Session: Not on file  Stress:   . Feeling of Stress : Not on file  Social Connections:   . Frequency of Communication with Friends and Family: Not on file  . Frequency of Social Gatherings with Friends and Family: Not on file  . Attends Religious Services: Not on file  . Active Member of Clubs or Organizations: Not on file  . Attends Archivist Meetings: Not on file  . Marital Status: Not on file  Intimate Partner Violence:   . Fear of Current or Ex-Partner: Not on file  . Emotionally Abused: Not on file  . Physically Abused: Not on file  . Sexually Abused: Not on file     Allergies  Allergen Reactions  . Lisinopril Cough  . Metformin And Related Diarrhea and Other (See Comments)    Diarrhea and GI upset on IR 1 g BID. Trialed IR 500 mg BID with no improvement.  . Oxycodone Itching     Outpatient Medications Prior to Visit  Medication Sig Dispense Refill  . Blood Glucose Monitoring Suppl (TRUE METRIX METER) w/Device KIT Use as directed 1 kit 0  . glucose blood (TRUE METRIX BLOOD GLUCOSE TEST) test strip Use as instructed 100 each 12  . losartan (COZAAR) 100 MG tablet Take 1 tablet (100 mg total) by mouth daily. 90 tablet 0  . SYNTHROID 150 MCG tablet Take 1 tablet (150 mcg total) by mouth daily. 90 tablet 0  . TRUEplus Lancets 28G MISC Use as directed 100 each 4  . atorvastatin (LIPITOR) 40 MG tablet Take 40 mg by mouth daily.    . carvedilol (COREG) 25 MG tablet Take 1 tablet  (25 mg total) by mouth 2 (two) times daily with a meal. 180 tablet 0  . esomeprazole (NEXIUM) 40 MG capsule Take 1 capsule (40 mg total) by mouth daily. 90 capsule 0  . gabapentin (NEURONTIN) 300 MG capsule Take 2 capsules (600 mg total) by mouth 2 (two) times daily. 180 capsule 1  . glipiZIDE (GLUCOTROL) 10 MG tablet Take 1 tablet (10 mg total) by mouth 2 (two)  times daily before a meal. 180 tablet 0  . insulin glargine (LANTUS) 100 unit/mL SOPN Inject 0.16 mLs (16 Units total) into the skin daily. 6 mL 2  . spironolactone (ALDACTONE) 25 MG tablet Take 1 tablet (25 mg total) by mouth daily. 30 tablet 2  . sucralfate (CARAFATE) 1 GM/10ML suspension Take 10 mLs (1 g total) by mouth 4 (four) times daily. (Patient not taking: Reported on 04/02/2020) 420 mL 2   No facility-administered medications prior to visit.       Review of Systems  HENT: Negative.   Eyes: Negative for visual disturbance.  Respiratory: Positive for cough and wheezing. Negative for shortness of breath.   Cardiovascular: Negative for chest pain, palpitations and leg swelling.  Gastrointestinal: Positive for abdominal distention, abdominal pain, constipation and vomiting. Negative for anal bleeding, blood in stool, diarrhea and nausea.       No gerd   Genitourinary: Positive for pelvic pain. Negative for difficulty urinating, dysuria and vaginal bleeding.  Skin: Positive for rash.  Neurological: Negative.   Psychiatric/Behavioral: Negative for dysphoric mood. The patient is not nervous/anxious.        Objective:   Physical Exam  Vitals:   04/02/20 1511  BP: (!) 154/86  Pulse: 60  SpO2: 98%  Weight: 151 lb 9.6 oz (68.8 kg)  Height: 5' 3"  (1.6 m)    Gen: Pleasant, well-nourished, in no distress,  normal affect  ENT: No lesions,  mouth clear,  oropharynx clear, no postnasal drip  Neck: No JVD, no TMG, no carotid bruits  Lungs: No use of accessory muscles, no dullness to percussion, clear without rales or  rhonchi  Cardiovascular: RRR, heart sounds normal, no murmur or gallops, no peripheral edema  Abdomen: soft and NT, no HSM,  BS normal  Musculoskeletal: No deformities, no cyanosis or clubbing  Neuro: alert, non focal  Skin: Warm, no lesions or rashes  No results found. Lab Results  Component Value Date   HGBA1C 6.1 (A) 04/02/2020   Hemoglobin A1c today is 6.1      Assessment & Plan:  I personally reviewed all images and lab data in the [facility] system as well as any outside material available during this office visit and agree with the  radiology impressions.   Hypertensive heart disease with diastolic heart failure (HCC) Hypertensive heart disease with current blood pressure in the 154/86 range  Plan is to refill current blood pressure medications at this time without change  Chronic constipation Constipation is a chronic problem plan lose recommend the patient eat more regularly with high-fiber diet and to drink plenty of water and as well begin Senokot-S 2 daily  Postoperative hypothyroidism Postop hypothyroidism continue Synthroid current dose refills given  Type 2 diabetes mellitus without complication, with long-term current use of insulin (HCC) Type 2 diabetes at goal now with hemoglobin A1c of 6.1  We will plan to discontinue glipizide continue Lantus but increase to 20 units daily and begin Jardiance 10 mg daily  Fibroids Uterine fibroids which are likely contributing to constipation  From my perspective this patient can be cleared for any planned surgery in the uterus  GERD (gastroesophageal reflux disease) Reflux disease is the cause of the abdominal pain along with fibroid uterus  Plan is to increase Nexium to twice daily and administer reflux diet to the patient  History of tobacco use Not currently smoking at this time  Hyperlipidemia associated with type 2 diabetes mellitus ([place]) Continue atorvastatin refills given   [facility] was  seen today for  referral.  Diagnoses and all orders for this visit:  Type 2 diabetes mellitus without complication, with long-term current use of insulin ([place])  Type 2 diabetes mellitus with hyperglycemia, without long-term current use of insulin (HCC) -     POCT glucose (manual entry) -     POCT glycosylated hemoglobin (Hb A1C) -     insulin glargine (LANTUS) 100 unit/mL SOPN; Inject 20 Units into the skin at bedtime.  Diabetic polyneuropathy associated with type 2 diabetes mellitus (HCC) -     gabapentin (NEURONTIN) 300 MG capsule; Take 2 capsules (600 mg total) by mouth 2 (two) times daily.  Gastroesophageal reflux disease with esophagitis without hemorrhage -     esomeprazole (NEXIUM) 40 MG capsule; Take 1 capsule (40 mg total) by mouth 2 (two) times daily before a meal.  Essential hypertension -     spironolactone (ALDACTONE) 25 MG tablet; Take 1 tablet (25 mg total) by mouth daily.  Postoperative hypothyroidism  Need for hepatitis C screening test -     HCV Ab w Reflex to Quant PCR  Encounter for screening for HIV -     HIV Antibody (routine testing w rflx)  Colon cancer screening -     Fecal occult blood, imunochemical  Encounter for screening mammogram for malignant neoplasm of breast -     MM DIGITAL SCREENING BILATERAL; Future  Hypertensive heart disease with diastolic heart failure (HCC)  Chronic constipation  Fibroids  History of tobacco use  Hyperlipidemia associated with type 2 diabetes mellitus ([place])  Other orders -     atorvastatin (LIPITOR) 40 MG tablet; Take 1 tablet (40 mg total) by mouth daily. -     carvedilol (COREG) 25 MG tablet; Take 1 tablet (25 mg total) by mouth 2 (two) times daily with a meal. -     carvedilol (COREG) 25 MG tablet; Take 1 tablet (25 mg total) by mouth 2 (two) times daily with a meal. -     senna-docusate (SENOKOT-S) 8.6-50 MG tablet; Take 1 tablet by mouth daily. -     empagliflozin (JARDIANCE) 10 MG TABS tablet; Take 1 tablet (10 mg  total) by mouth daily.

## 2020-04-02 NOTE — Assessment & Plan Note (Signed)
Not currently smoking at this time

## 2020-04-02 NOTE — Assessment & Plan Note (Signed)
Postop hypothyroidism continue Synthroid current dose refills given

## 2020-04-02 NOTE — Assessment & Plan Note (Signed)
Constipation is a chronic problem plan lose recommend the patient eat more regularly with high-fiber diet and to drink plenty of water and as well begin Senokot-S 2 daily

## 2020-04-02 NOTE — Assessment & Plan Note (Signed)
Hypertensive heart disease with current blood pressure in the 154/86 range  Plan is to refill current blood pressure medications at this time without change

## 2020-04-02 NOTE — Assessment & Plan Note (Signed)
Uterine fibroids which are likely contributing to constipation  From my perspective this patient can be cleared for any planned surgery in the uterus

## 2020-04-02 NOTE — Assessment & Plan Note (Signed)
Reflux disease is the cause of the abdominal pain along with fibroid uterus  Plan is to increase Nexium to twice daily and administer reflux diet to the patient

## 2020-04-02 NOTE — Patient Instructions (Signed)
Stop glipizide Increase Lantus to 20 units nightly Begin Jardiance 10 mg daily Continue to check your blood sugars twice daily  The Jardiance will help lower blood pressure  No change in your other medications I did send a 90-day supply of the Coreg to [facility] you already have 90-day supplies sent on your other medicines that you get from [facility] including your Synthroid and losartan recently sent  Short-term supply of Coreg sent to our pharmacy  Please obtain a Covid vaccine you to get the [place] COVID-19 Vaccine Information can be found at: ShippingScam.co.uk For questions related to vaccine distribution or appointments, please email [email or web address] or call [phone].    Your A1c is 6.1 which is terrific please though drink plenty of fluids and use a healthy diet do not skip meals if you skip a meal take Glucerna nutrition supplements or an equivalent dietary nutritional supplement that is for diabetics  A mammogram will be scheduled  Fecal occult kit given for colon cancer screening  Increase the Nexium to twice daily  Watch your acid intake see below on your diet  Return to see Dr. Joya Gaskins last week in December  I plan to clear you for planned surgery on your uterus I will send a note to your gynecologist   Diabetes Mellitus and Nutrition, Adult When you have diabetes (diabetes mellitus), it is very important to have healthy eating habits because your blood sugar (glucose) levels are greatly affected by what you eat and drink. Eating healthy foods in the appropriate amounts, at about the same times every day, can help you:  Control your blood glucose.  Lower your risk of heart disease.  Improve your blood pressure.  Reach or maintain a healthy weight. Every person with diabetes is different, and each person has different needs for a meal plan. Your health care provider may recommend that you work with  a diet and nutrition specialist (dietitian) to make a meal plan that is best for you. Your meal plan may vary depending on factors such as:  The calories you need.  The medicines you take.  Your weight.  Your blood glucose, blood pressure, and cholesterol levels.  Your activity level.  Other health conditions you have, such as heart or kidney disease. How do carbohydrates affect me? Carbohydrates, also called carbs, affect your blood glucose level more than any other type of food. Eating carbs naturally raises the amount of glucose in your blood. Carb counting is a method for keeping track of how many carbs you eat. Counting carbs is important to keep your blood glucose at a healthy level, especially if you use insulin or take certain oral diabetes medicines. It is important to know how many carbs you can safely have in each meal. This is different for every person. Your dietitian can help you calculate how many carbs you should have at each meal and for each snack. Foods that contain carbs include:  Bread, cereal, rice, pasta, and crackers.  Potatoes and corn.  Peas, beans, and lentils.  Milk and yogurt.  Fruit and juice.  Desserts, such as cakes, cookies, ice cream, and candy. How does alcohol affect me? Alcohol can cause a sudden decrease in blood glucose (hypoglycemia), especially if you use insulin or take certain oral diabetes medicines. Hypoglycemia can be a life-threatening condition. Symptoms of hypoglycemia (sleepiness, dizziness, and confusion) are similar to symptoms of having too much alcohol. If your health care provider says that alcohol is safe for you, follow these guidelines:  Limit  alcohol intake to no more than 1 drink per day for nonpregnant women and 2 drinks per day for men. One drink equals 12 oz of beer, 5 oz of wine, or 1 oz of hard liquor.  Do not drink on an empty stomach.  Keep yourself hydrated with water, diet soda, or unsweetened iced tea.  Keep  in mind that regular soda, juice, and other mixers may contain a lot of sugar and must be counted as carbs. What are tips for following this plan?  Reading food labels  Start by checking the serving size on the "Nutrition Facts" label of packaged foods and drinks. The amount of calories, carbs, fats, and other nutrients listed on the label is based on one serving of the item. Many items contain more than one serving per package.  Check the total grams (g) of carbs in one serving. You can calculate the number of servings of carbs in one serving by dividing the total carbs by 15. For example, if a food has 30 g of total carbs, it would be equal to 2 servings of carbs.  Check the number of grams (g) of saturated and trans fats in one serving. Choose foods that have low or no amount of these fats.  Check the number of milligrams (mg) of salt (sodium) in one serving. Most people should limit total sodium intake to less than 2,300 mg per day.  Always check the nutrition information of foods labeled as "low-fat" or "nonfat". These foods may be higher in added sugar or refined carbs and should be avoided.  Talk to your dietitian to identify your daily goals for nutrients listed on the label. Shopping  Avoid buying canned, premade, or processed foods. These foods tend to be high in fat, sodium, and added sugar.  Shop around the outside edge of the grocery store. This includes fresh fruits and vegetables, bulk grains, fresh meats, and fresh dairy. Cooking  Use low-heat cooking methods, such as baking, instead of high-heat cooking methods like deep frying.  Cook using healthy oils, such as olive, canola, or sunflower oil.  Avoid cooking with butter, cream, or high-fat meats. Meal planning  Eat meals and snacks regularly, preferably at the same times every day. Avoid going long periods of time without eating.  Eat foods high in fiber, such as fresh fruits, vegetables, beans, and whole grains.  Talk to your dietitian about how many servings of carbs you can eat at each meal.  Eat 4-6 ounces (oz) of lean protein each day, such as lean meat, chicken, fish, eggs, or tofu. One oz of lean protein is equal to: ? 1 oz of meat, chicken, or fish. ? 1 egg. ?  cup of tofu.  Eat some foods each day that contain healthy fats, such as avocado, nuts, seeds, and fish. Lifestyle  Check your blood glucose regularly.  Exercise regularly as told by your health care provider. This may include: ? 150 minutes of moderate-intensity or vigorous-intensity exercise each week. This could be brisk walking, biking, or water aerobics. ? Stretching and doing strength exercises, such as yoga or weightlifting, at least 2 times a week.  Take medicines as told by your health care provider.  Do not use any products that contain nicotine or tobacco, such as cigarettes and e-cigarettes. If you need help quitting, ask your health care provider.  Work with a Social worker or diabetes educator to identify strategies to manage stress and any emotional and social challenges. Questions to ask a health  care provider  Do I need to meet with a diabetes educator?  Do I need to meet with a dietitian?  What number can I call if I have questions?  When are the best times to check my blood glucose? Where to find more information:  American Diabetes Association: diabetes.org  Academy of Nutrition and Dietetics: www.eatright.[employer] of Diabetes and Digestive and Kidney Diseases (NIH): [employer] Summary  A healthy meal plan will help you control your blood glucose and maintain a healthy lifestyle.  Working with a diet and nutrition specialist (dietitian) can help you make a meal plan that is best for you.  Keep in mind that carbohydrates (carbs) and alcohol have immediate effects on your blood glucose levels. It is important to count carbs and to use alcohol carefully. This information is not  intended to replace advice given to you by your health care provider. Make sure you discuss any questions you have with your health care provider. Document Revised: 03/26/2017 Document Reviewed: 05/18/2016 Elsevier Patient Education  2020 [place] for Gastroesophageal Reflux Disease, Adult When you have gastroesophageal reflux disease (GERD), the foods you eat and your eating habits are very important. Choosing the right foods can help ease the discomfort of GERD. Consider working with a diet and nutrition specialist (dietitian) to help you make healthy food choices. What general guidelines should I follow?  Eating plan  Choose healthy foods low in fat, such as fruits, vegetables, whole grains, low-fat dairy products, and lean meat, fish, and poultry.  Eat frequent, small meals instead of three large meals each day. Eat your meals slowly, in a relaxed setting. Avoid bending over or lying down until 2-3 hours after eating.  Limit high-fat foods such as fatty meats or fried foods.  Limit your intake of oils, butter, and shortening to less than 8 teaspoons each day.  Avoid the following: ? Foods that cause symptoms. These may be different for different people. Keep a food diary to keep track of foods that cause symptoms. ? Alcohol. ? Drinking large amounts of liquid with meals. ? Eating meals during the 2-3 hours before bed.  Cook foods using methods other than frying. This may include baking, grilling, or broiling. Lifestyle  Maintain a healthy weight. Ask your health care provider what weight is healthy for you. If you need to lose weight, work with your health care provider to do so safely.  Exercise for at least 30 minutes on 5 or more days each week, or as told by your health care provider.  Avoid wearing clothes that fit tightly around your waist and chest.  Do not use any products that contain nicotine or tobacco, such as cigarettes and e-cigarettes. If you  need help quitting, ask your health care provider.  Sleep with the head of your bed raised. Use a wedge under the mattress or blocks under the bed frame to raise the head of the bed. What foods are not recommended? The items listed may not be a complete list. Talk with your dietitian about what dietary choices are best for you. Grains Pastries or quick breads with added fat. [place] toast. Vegetables Deep fried vegetables. [place] fries. Any vegetables prepared with added fat. Any vegetables that cause symptoms. For some people this may include tomatoes and tomato products, chili peppers, onions and garlic, and horseradish. Fruits Any fruits prepared with added fat. Any fruits that cause symptoms. For some people this may include citrus fruits, such as oranges,  grapefruit, pineapple, and lemons. Meats and other protein foods High-fat meats, such as fatty beef or pork, hot dogs, ribs, ham, sausage, salami and bacon. Fried meat or protein, including fried fish and fried chicken. Nuts and nut butters. Dairy Whole milk and chocolate milk. Sour cream. Cream. Ice cream. Cream cheese. Milk shakes. Beverages Coffee and tea, with or without caffeine. Carbonated beverages. Sodas. Energy drinks. Fruit juice made with acidic fruits (such as orange or grapefruit). Tomato juice. Alcoholic drinks. Fats and oils Butter. Margarine. Shortening. Ghee. Sweets and desserts Chocolate and cocoa. Donuts. Seasoning and other foods Pepper. Peppermint and spearmint. Any condiments, herbs, or seasonings that cause symptoms. For some people, this may include curry, hot sauce, or vinegar-based salad dressings. Summary  When you have gastroesophageal reflux disease (GERD), food and lifestyle choices are very important to help ease the discomfort of GERD.  Eat frequent, small meals instead of three large meals each day. Eat your meals slowly, in a relaxed setting. Avoid bending over or lying down until 2-3 hours after  eating.  Limit high-fat foods such as fatty meat or fried foods. This information is not intended to replace advice given to you by your health care provider. Make sure you discuss any questions you have with your health care provider. Document Revised: 08/04/2018 Document Reviewed: 04/14/2016 Elsevier Patient Education  [place].

## 2020-04-03 MED FILL — ?CARVEDILOL 25 MG TABLET: 25 | 30 days supply | Qty: 60 | Fill #0

## 2020-04-03 MED FILL — ?ESOMEPRAZOLE MAGN DR 40MG: 40 | 30 days supply | Qty: 60 | Fill #0

## 2020-04-03 MED FILL — JARDIANCE 10 MG TABLET: 10 | 30 days supply | Qty: 30 | Fill #0

## 2020-04-03 MED FILL — GABAPENTIN 300 MG CAPSULE: 300 | 30 days supply | Qty: 60 | Fill #0

## 2020-04-23 MED FILL — ?SPIRONOLACTONE 25 MG TABLE: 25 | 30 days supply | Qty: 30 | Fill #1

## 2020-04-28 NOTE — Progress Notes (Signed)
Subjective:    Patient ID: Suzanne Cline, female    DOB: [birth year], 55 y.o.   MRN: [id number]  54 y.o.F PCP to est former pt of dr Chapman Fitch Hx of HFpEF, HTN, MVR, T2DM, HLD, tobacco,GERD with reflux esophagitis without bleeding on 09/2019 EGD with [facility]  This patient is seen in follow-up and to establish primary care.  The patient formally was a patient of Dr. Chapman Fitch who is no longer in the practice and the patient is here now for the first visit with me.  Patient history dates back and that she has hypertension, heart failure with preserved ejection fraction EF 65% with thickened left ventricle and diastolic heart failure, mitral regurgitation, type 2 diabetes, hyperlipidemia, tobacco use, reflux disease with recent upper endoscopy biopsy showing reflux esophagitis only   Patient has longstanding issues of upper abdominal pain left upper quadrant and epigastric pain she has constipation with this as well it feels like a tightness in the epigastric area and radiates into the back.  She has been taking once daily Nexium without much improvement in the symptoms.  She has 1 bowel movement every 3 to 4 days.  She does vape CBD oil but is no longer smoking cigarettes.  Patient is on thyroid supplementation at this time.  Note on arrival hemoglobin A1c was 6.1.  She is on the Lantus 16 units daily and also glipizide twice daily.  Blood sugar on arrival today was 78.  She notes she misses meals on several occasions.  Note this patient needs a mammogram hepatitis C and HIV screening.  She also needs colon cancer screening.  Note this patient is self-pay.  This patient also has a history of yet to receive Covid vaccine and is also refusing flu vaccine at this visit.  Patient still has a lot of misinformation and fears about the Covid vaccine that she states.  Note the patient does not check her blood sugar on a regular basis.  She will miss many meals.  The patient does get refills from [facility]  program and is requesting many of her chronic medications be sent to that location with 90-day refills   04/30/20 This patient is seen in short-term follow-up and is still having abdominal pain now radiating down to the pelvic area with incontinence.  She is now having bowel movements at least more often 1-2 times daily instead of every 3 to 4 days.  Despite being on high-dose Nexium the abdominal pain persists.  She states it is a pulling tightness sensation a sensation of fullness and pushing upwards towards the chest from the lower abdomen.  She does have a large uterine fibroid that is needed to be removed with hysterectomy.  She was awaiting clearance from her other physicians.  I pick this patient up in December and now that I have a better understanding of her cardiac status and the fact that her diabetes is well controlled with hemoglobin A1c down to 6.1 we can expedite referral back to gynecology for consideration of hysterectomy.  The patient on arrival has a blood pressure 132/87 which is in good range.  The patient maintains Synthroid for postoperative hypothyroidism.  She is maintaining Lantus 20 units daily and Jardiance 10 mg daily blood sugars have been in the 110 range she is now off the glipizide.   We discussed again during the interview the importance of a Covid vaccine she is now willing to proceed and request a Pfizer vaccine.  The patient also is  yet to turn in all get as it was not processed and instead was mailed in and somehow lost so a new kit will have to be issued  Past Medical History:  Diagnosis Date  . CHF (congestive heart failure) ([place])   . Diabetes mellitus without complication ([place])   . Fibroids, intramural    s/p uterine artery embolization  . Former cigarette smoker 04/2011  . History of Resolved Nonischemic Congestive Cardiomyopathy ([place]) 04/2011   Echo March 2013: EF 35% with mild/moderate diastolic dysfunction -> Follow-Up Echo March 2014 EF 55% with G1 DD.   [place] Hypertension   . Hypertensive heart disease    Difficult control hypertension  . Mild concentric left ventricular hypertrophy (LVH) 10/08/2019  . Moderate mitral valve regurgitation 10/08/2019   Echo done June 2021; Dr. Ellyn Hack  . Neuromuscular disorder ([place])    diabetic neuropathy  . OSA (obstructive sleep apnea)   . Sleep apnea    doesn't use cpap regularly  . Thyroid disease    post thyroidectomy     Family History  Problem Relation Age of Onset  . Congestive Heart Failure Mother   . Breast cancer Neg Hx   . Colon cancer Neg Hx   . Esophageal cancer Neg Hx   . Rectal cancer Neg Hx   . Stomach cancer Neg Hx   . Inflammatory bowel disease Neg Hx   . Liver disease Neg Hx   . Pancreatic cancer Neg Hx      Social History   Socioeconomic History  . Marital status: Single    Spouse name: Not on file  . Number of children: Not on file  . Years of education: Not on file  . Highest education level: Not on file  Occupational History  . Not on file  Tobacco Use  . Smoking status: Former Smoker    Types: Cigars    Quit date: 03/2019    Years since quitting: 1.0  . Smokeless tobacco: Never Used  . Tobacco comment: smokes occasional cigar  Vaping Use  . Vaping Use: Never used  Substance and Sexual Activity  . Alcohol use: Yes    Comment: social  . Drug use: Never  . Sexual activity: Not Currently  Other Topics Concern  . Not on file  Social History Narrative  . Not on file   Social Determinants of Health   Financial Resource Strain: Not on file  Food Insecurity: Not on file  Transportation Needs: Not on file  Physical Activity: Not on file  Stress: Not on file  Social Connections: Not on file  Intimate Partner Violence: Not on file     Allergies  Allergen Reactions  . Lisinopril Cough  . Metformin And Related Diarrhea and Other (See Comments)    Diarrhea and GI upset on IR 1 g BID. Trialed IR 500 mg BID with no improvement.  . Oxycodone Itching      Outpatient Medications Prior to Visit  Medication Sig Dispense Refill  . atorvastatin (LIPITOR) 40 MG tablet Take 1 tablet (40 mg total) by mouth daily. 90 tablet 1  . empagliflozin (JARDIANCE) 10 MG TABS tablet Take 1 tablet (10 mg total) by mouth daily. 30 tablet 6  . esomeprazole (NEXIUM) 40 MG capsule Take 1 capsule (40 mg total) by mouth 2 (two) times daily before a meal. 180 capsule 1  . gabapentin (NEURONTIN) 300 MG capsule Take 2 capsules (600 mg total) by mouth 2 (two) times daily. 180 capsule 1  . insulin  glargine (LANTUS) 100 unit/mL SOPN Inject 20 Units into the skin at bedtime. 6 mL 2  . losartan (COZAAR) 100 MG tablet Take 1 tablet (100 mg total) by mouth daily. 90 tablet 0  . spironolactone (ALDACTONE) 25 MG tablet Take 1 tablet (25 mg total) by mouth daily. 30 tablet 2  . SYNTHROID 150 MCG tablet Take 1 tablet (150 mcg total) by mouth daily. 90 tablet 0  . carvedilol (COREG) 25 MG tablet Take 1 tablet (25 mg total) by mouth 2 (two) times daily with a meal. 180 tablet 0  . Blood Glucose Monitoring Suppl (TRUE METRIX METER) w/Device KIT Use as directed 1 kit 0  . glucose blood (TRUE METRIX BLOOD GLUCOSE TEST) test strip Use as instructed 100 each 12  . senna-docusate (SENOKOT-S) 8.6-50 MG tablet Take 1 tablet by mouth daily. 60 tablet 6  . TRUEplus Lancets 28G MISC Use as directed 100 each 4  . carvedilol (COREG) 25 MG tablet Take 1 tablet (25 mg total) by mouth 2 (two) times daily with a meal. 60 tablet 3   No facility-administered medications prior to visit.       Review of Systems  HENT: Negative.   Eyes: Negative for visual disturbance.  Respiratory: Positive for cough and wheezing. Negative for shortness of breath.   Cardiovascular: Negative for chest pain, palpitations and leg swelling.  Gastrointestinal: Positive for abdominal distention, abdominal pain, constipation and vomiting. Negative for anal bleeding, blood in stool, diarrhea and nausea.       No gerd    Genitourinary: Positive for pelvic pain. Negative for difficulty urinating, dysuria and vaginal bleeding.  Skin: Positive for rash.  Neurological: Negative.   Psychiatric/Behavioral: Negative for dysphoric mood. The patient is not nervous/anxious.        Objective:   Physical Exam  Vitals:   04/30/20 1052  BP: 132/87  Pulse: 62  Resp: 16  SpO2: 97%  Weight: 151 lb (68.5 kg)    Gen: Pleasant, well-nourished, in no distress,  normal affect  ENT: No lesions,  mouth clear,  oropharynx clear, no postnasal drip  Neck: No JVD, no TMG, no carotid bruits  Lungs: No use of accessory muscles, no dullness to percussion, clear without rales or rhonchi  Cardiovascular: RRR, heart sounds normal, no murmur or gallops, no peripheral edema  Abdomen: soft and NT, no HSM,  BS normal  Musculoskeletal: No deformities, no cyanosis or clubbing  Neuro: alert, non focal  Skin: Warm, no lesions or rashes  No results found. Lab Results  Component Value Date   HGBA1C 6.1 (A) 04/02/2020   Hemoglobin A1c today is 6.1      Assessment & Plan:  I personally reviewed all images and lab data in the [facility] system as well as any outside material available during this office visit and agree with the  radiology impressions.   Essential hypertension Hypertension under excellent control at this time no indication or need for further medication adjustments  Hypertensive heart disease with diastolic heart failure (HCC) CurrentHypertensive heart disease with diastolic heart failure stable at this time allocations and patient can be cleared from a cardiac and medical standpoint for planned gynecology surgery  Chronic constipation Chronic constipation has improved  GERD (gastroesophageal reflux disease) It is uncertain to me that reflux is playing a role in her abdominal pain  Type 2 diabetes mellitus without complication, with long-term current use of insulin (HCC) Excellent control of diabetes at  this time no change in medications  Fibroids   The patient is cleared for planned gynecology surgery and I have sent Dr. Rip Harbour message   Diagnoses and all orders for this visit:  Colon cancer screening -     Fecal occult blood, imunochemical  Fibroids -     Ambulatory referral to Gynecology  Essential hypertension  Hypertensive heart disease with diastolic heart failure (HCC)  Chronic constipation  Gastroesophageal reflux disease with esophagitis without hemorrhage  Type 2 diabetes mellitus without complication, with long-term current use of insulin ([place])  Other orders -     carvedilol (COREG) 25 MG tablet; Take 1 tablet (25 mg total) by mouth 2 (two) times daily with a meal.  I gave the patient information where she could find and locate a [place] Covid vaccine  She will receive a new fecal occult kit produce a sample and bring it back into the office directly  I sent her Coreg prescription to her medication assistance company and [place]

## 2020-04-30 ENCOUNTER — Ambulatory Visit: Payer: Self-pay | Attending: Critical Care Medicine | Admitting: Critical Care Medicine

## 2020-04-30 ENCOUNTER — Other Ambulatory Visit: Payer: Self-pay

## 2020-04-30 ENCOUNTER — Encounter: Payer: Self-pay | Admitting: Critical Care Medicine

## 2020-04-30 VITALS — BP 132/87 | HR 62 | Resp 16 | Wt 151.0 lb

## 2020-04-30 DIAGNOSIS — I5030 Unspecified diastolic (congestive) heart failure: Secondary | ICD-10-CM

## 2020-04-30 DIAGNOSIS — D219 Benign neoplasm of connective and other soft tissue, unspecified: Secondary | ICD-10-CM

## 2020-04-30 DIAGNOSIS — K5909 Other constipation: Secondary | ICD-10-CM

## 2020-04-30 DIAGNOSIS — Z794 Long term (current) use of insulin: Secondary | ICD-10-CM

## 2020-04-30 DIAGNOSIS — I110 Hypertensive heart disease with heart failure: Secondary | ICD-10-CM

## 2020-04-30 DIAGNOSIS — E1142 Type 2 diabetes mellitus with diabetic polyneuropathy: Secondary | ICD-10-CM

## 2020-04-30 DIAGNOSIS — K2100 Gastro-esophageal reflux disease with esophagitis, without bleeding: Secondary | ICD-10-CM

## 2020-04-30 DIAGNOSIS — E119 Type 2 diabetes mellitus without complications: Secondary | ICD-10-CM

## 2020-04-30 DIAGNOSIS — Z1211 Encounter for screening for malignant neoplasm of colon: Secondary | ICD-10-CM

## 2020-04-30 DIAGNOSIS — I10 Essential (primary) hypertension: Secondary | ICD-10-CM

## 2020-04-30 MED ORDER — CARVEDILOL 25 MG PO TABS: 25.0000 mg | ORAL_TABLET | Freq: Two times a day (BID) | ORAL | 0 refills | Status: DC

## 2020-04-30 MED FILL — JARDIANCE 10 MG TABLET: 10 | 30 days supply | Qty: 30 | Fill #1

## 2020-04-30 NOTE — Patient Instructions (Signed)
Please obtain your Covid vaccine see below we can find the [place] vaccine COVID-19 Vaccine Information can be found at: ShippingScam.co.uk For questions related to vaccine distribution or appointments, please email [email or web address] or call [phone].    No change in your medications  Pick up another fecal occult cancer screening kit today from the lab as you leave and bring it back when you have obtain the sample  I am referring you back to Dr. Rip Harbour for evaluation for removal of the fibroid uterus as you are medically clear for this at this time  You can reduce the Nexium to 1 a day if you wish it does not appear your symptoms now are from reflux  Return to see Dr. Joya Gaskins again in 2 months

## 2020-04-30 NOTE — Assessment & Plan Note (Signed)
Diabetic polyneuropathy still present but improved with improved control of diabetes

## 2020-04-30 NOTE — Assessment & Plan Note (Signed)
The patient is cleared for planned gynecology surgery and I have sent Dr. Alysia Penna message

## 2020-04-30 NOTE — Assessment & Plan Note (Signed)
CurrentHypertensive heart disease with diastolic heart failure stable at this time allocations and patient can be cleared from a cardiac and medical standpoint for planned gynecology surgery

## 2020-04-30 NOTE — Assessment & Plan Note (Signed)
It is uncertain to me that reflux is playing a role in her abdominal pain

## 2020-04-30 NOTE — Assessment & Plan Note (Signed)
Hypertension under excellent control at this time no indication or need for further medication adjustments

## 2020-04-30 NOTE — Assessment & Plan Note (Signed)
Chronic constipation has improved

## 2020-04-30 NOTE — Progress Notes (Signed)
Concerns with pain in  Abdomen-  Right thumb-

## 2020-04-30 NOTE — Assessment & Plan Note (Signed)
Excellent control of diabetes at this time no change in medications

## 2020-05-01 ENCOUNTER — Other Ambulatory Visit: Payer: Self-pay | Admitting: Obstetrics and Gynecology

## 2020-05-01 DIAGNOSIS — Z1231 Encounter for screening mammogram for malignant neoplasm of breast: Secondary | ICD-10-CM

## 2020-05-02 ENCOUNTER — Telehealth: Payer: Self-pay | Admitting: Critical Care Medicine

## 2020-05-02 NOTE — Telephone Encounter (Signed)
Patient came in to drop off stool sample and wanted me to verify if that tested for H. Pylori. I asked lab tech and was informed that for an H. Pylori test blood work or a drink solution was typically needed.   I relayed message to patient that her stool sample was a colon cancer screening not an H. Pylori test. Patient was upset stating that she wanted to be tested for H. Pylori not colon cancer. Please advise.

## 2020-05-03 LAB — FECAL OCCULT BLOOD, IMMUNOCHEMICAL: Fecal Occult Bld: NEGATIVE

## 2020-05-03 NOTE — Telephone Encounter (Signed)
There is no indication for H Pylori testing in this patient

## 2020-05-04 ENCOUNTER — Ambulatory Visit: Payer: Self-pay

## 2020-05-10 ENCOUNTER — Ambulatory Visit: Payer: BC Managed Care – PPO | Attending: Internal Medicine

## 2020-05-10 ENCOUNTER — Other Ambulatory Visit: Payer: Self-pay

## 2020-05-23 ENCOUNTER — Ambulatory Visit: Payer: Self-pay

## 2020-05-27 ENCOUNTER — Encounter: Payer: Self-pay | Admitting: Obstetrics and Gynecology

## 2020-05-27 ENCOUNTER — Other Ambulatory Visit: Payer: Self-pay

## 2020-05-27 ENCOUNTER — Ambulatory Visit (INDEPENDENT_AMBULATORY_CARE_PROVIDER_SITE_OTHER): Payer: BC Managed Care – PPO | Admitting: Obstetrics and Gynecology

## 2020-05-27 VITALS — Ht 63.0 in | Wt 154.6 lb

## 2020-05-27 DIAGNOSIS — D219 Benign neoplasm of connective and other soft tissue, unspecified: Secondary | ICD-10-CM | POA: Diagnosis not present

## 2020-05-27 NOTE — Progress Notes (Signed)
Suzanne Cline presents for pre op appt for TAH/BS.  She has now received pre op clearance from her PCP.  PE AF VSS Lungs clear Heart RRR Abd soft + BS abd/plevic mass affect to umbilicus GU deferred  A/P Uterine fibroid  TAH/BS reviewed with pt. R/B/Post op care discussed. Information provided as well. Pt is uncertain above financial coverage. Pt instructed to pick up financial aid application at check out. Once approved will schedule surgery.

## 2020-05-27 NOTE — Patient Instructions (Signed)
Abdominal Hysterectomy, Care After The following information offers guidance on how to care for yourself after your procedure. Your doctor may also give you more specific instructions. If you have problems or questions, contact your doctor. What can I expect after the procedure? After the procedure, it is common to have:  Pain.  Tiredness.  No desire to eat.  Less interest in sex.  Bleeding and fluid (discharge) from your vagina. You may need to use a pad after this procedure.  Trouble pooping (constipation).  Feelings of sadness or other emotions. Follow these instructions at home: Medicines  Take over-the-counter and prescription medicines only as told by your doctor.  Do not take aspirin or NSAIDs, such as ibuprofen. These medicines can cause bleeding.  If you were prescribed an antibiotic medicine, take it as told by your doctor. Do not stop using the antibiotic even if you start to feel better.  If told, take steps to prevent problems with pooping (constipation). You may need to: ? Drink enough fluid to keep your pee (urine) pale yellow. ? Take medicines. You will be told what medicines to take. ? Eat foods that are high in fiber. These include beans, whole grains, and fresh fruits and vegetables. ? Limit foods that are high in fat and sugar. These include fried or sweet foods.  Ask your doctor if you should avoid driving or using machines while you are taking your medicine. Surgical cut care  Follow instructions from your doctor about how to take care of your cut from surgery (incision). Make sure you: ? Wash your hands with soap and water for at least 20 seconds before and after you change your bandage. If you cannot use soap and water, use hand sanitizer. ? Change your bandage. ? Leave stitches or skin glue in place for at least two weeks. ? Leave tape strips alone unless you are told to take them off. You may trim the edges of the tape strips if they curl up.  Keep  the bandage dry until your doctor says it can be taken off.  Check your incision every day for signs of infection. Check for: ? More redness, swelling, or pain. ? Fluid or blood. ? Warmth. ? Pus or a bad smell.   Activity  Rest as told by your doctor.  Get up to take short walks every 1 to 2 hours. Ask for help if you feel weak or unsteady.  Do not lift anything that is heavier than 10 lb (4.5 kg), or the limit that you are told.  Follow your doctor's advice about exercise, driving, and general activities.  Return to your normal activities when your doctor says that it is safe.   Lifestyle  Do not douche, use tampons, or have sex for at least 6 weeks or as told by your doctor.  Do not drink alcohol until your doctor says it is okay.  Do not smoke or use any products that contain nicotine or tobacco. These can delay healing after surgery. If you need help quitting, ask your doctor. General instructions  Do not take baths, swim, or use a hot tub. Ask your doctor about taking showers or sponge baths.  Try to have a responsible adult at home with you for the first 1-2 weeks to help with your daily chores.  Wear tight-fitting (compression) stockings as told by your doctor.  Keep all follow-up visits. Contact a doctor if:  You have chills or a fever.  You have any of these signs  of infection around your cut: ? More redness, swelling, or pain. ? Fluid or blood. ? Warmth. ? Pus or a bad smell.  Your cut breaks open.  You feel dizzy or light-headed.  You have pain or bleeding when you pee.  You keep having watery poop (diarrhea).  You keep feeling like you may vomit or you keep vomiting.  You have fluid coming from your vagina that is not normal.  You have any type of reaction to your medicine that is not normal, like a rash, or you develop an allergy to your medicine.  Your pain medicine does not help. Get help right away if:  You have a fever and your symptoms  get worse suddenly.  You have very bad pain in your belly (abdomen).  You are short of breath.  You faint.  You have pain, swelling, or redness of your leg.  You bleed a lot from your vagina and you see blood clots. Summary  It is normal to have some pain, tiredness, and fluid that comes from your vagina.  Do not take baths, swim, or use a hot tub. Ask your doctor about taking showers or sponge baths.  Do not lift anything that is heavier than 10 lb (4.5 kg), or the limit that you are told.  Follow your doctor's advice about exercise, driving, and general activities.  Try to have a responsible adult at home with you for the first 1-2 weeks to help with your daily chores. This information is not intended to replace advice given to you by your health care provider. Make sure you discuss any questions you have with your health care provider. Document Revised: 12/14/2019 Document Reviewed: 12/14/2019 Elsevier Patient Education  2021 [employer]. Hysterectomy Information  A hysterectomy is a surgery to take out the womb (uterus) or the womb and the lowest part of the womb (cervix), which opens into the vagina. The ovaries, the fallopian tubes, or both may also be taken out. After the procedure, a woman will no longer have menstrual periods and will not be able to get pregnant. What are the benefits? This surgery may improve your quality of life by relieving pain and heavy vaginal bleeding. This surgery may also:  Treat long-term (chronic) infection in the area between your hip bones (pelvis).  Treat conditions that affect the womb.  Treat cancer of the womb or cervix.  Lower the risk of cancer. It may also be done for transgender men to match their gender identity. What are the risks? Generally, this surgery is safe. But problems can happen, including:  Bleeding.  Needing donated blood (transfusion).  Blood clots.  Infection.  Damage to nearby parts or  organs.  Allergic reactions to medicines.  Needing to switch from a surgery that requires small cuts (incisions) to a surgery that requires a large cut. What are the different types? These are the three types:  The top part of the womb is taken out, but not the cervix.  The womb and cervix are taken out.  The womb, the cervix, and the tissue that holds the womb in place are taken out. What happens during the procedure? This surgery may be done in one of these ways:  A cut is made in the belly (abdomen). The womb is taken out through the opening.  A cut is made in the vagina. The womb is taken out through the opening that is made.  A device with a camera to help see is put through one  of 3 or 4 cuts made in the belly. The womb is taken out through the vagina.  A device with a camera to help see is put through one of 3 or 4 cuts made in the belly. The womb is cut into pieces and taken out through the openings or through the vagina.  A computer helps control the surgical tools put into 3 or 4 cuts made in the belly. The womb is cut into small pieces. The pieces are taken out through the openings or through the vagina. Talk with your doctor to see which is the right one for you. The procedures may vary among doctors and hospitals. What happens after the procedure?  You will be given pain medicine.  You may need to stay in the hospital for 1-2 days.  You may need to have a responsible adult stay with you for a few days after you go home.  If your ovaries were taken out, you may get hot flashes, have night sweats, and have trouble sleeping.  Talk with your doctor about how often you need Pap tests.  Keep all follow-up visits. Questions to ask your doctor  Is this surgery needed? What other options do I have?  What organs need to be taken out?  How long will I need to stay in the hospital?  How long will it take to get better at home?  What symptoms can I expect after the  procedure? Summary  A hysterectomy is a surgery to take out your womb.  This may be done to treat conditions.  After this surgery, you will not have periods and you cannot get pregnant.  There are different types of this surgery. Talk with your doctor about which is best for you.  This surgery is safe, but there are some risks. This information is not intended to replace advice given to you by your health care provider. Make sure you discuss any questions you have with your health care provider. Document Revised: 02/07/2020 Document Reviewed: 02/07/2020 Elsevier Patient Education  2021 [employer].

## 2020-06-04 MED FILL — ?SPIRONOLACTONE 25 MG TABLE: 25 | 30 days supply | Qty: 30 | Fill #2

## 2020-06-15 IMAGING — MG DIGITAL DIAGNOSTIC BILAT W/ TOMO W/ CAD
7 of 14 series · 7 of 40 positions shown · non-contrast
Comparison: None
COMPARISON: None

Addendum:
CLINICAL DATA: Patient presents for palpable painful abnormality
within the left axilla.

EXAM:
DIGITAL DIAGNOSTIC BILATERAL MAMMOGRAM WITH CAD AND TOMO
ULTRASOUND LEFT BREAST

[L CC synth-2D (1 of 2)]
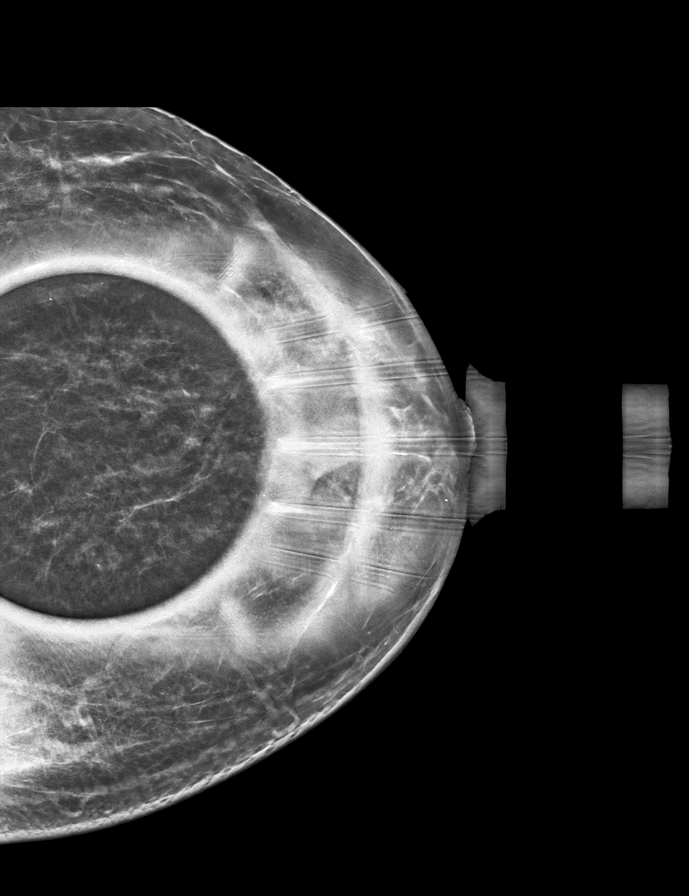

[L ML synth-2D]
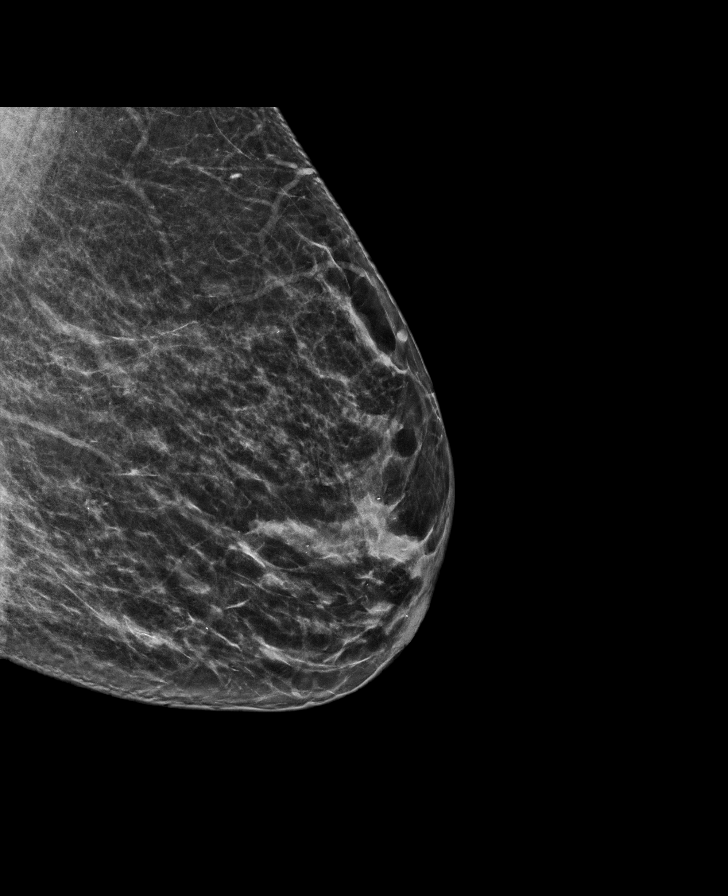

[R CC synth-2D]
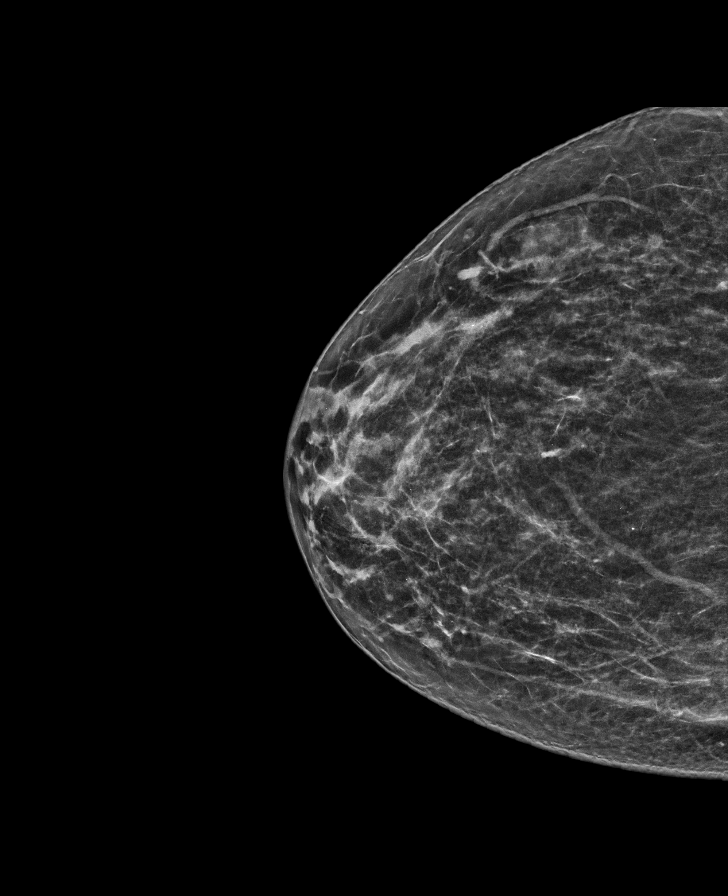

[L CC synth-2D (2 of 2)]
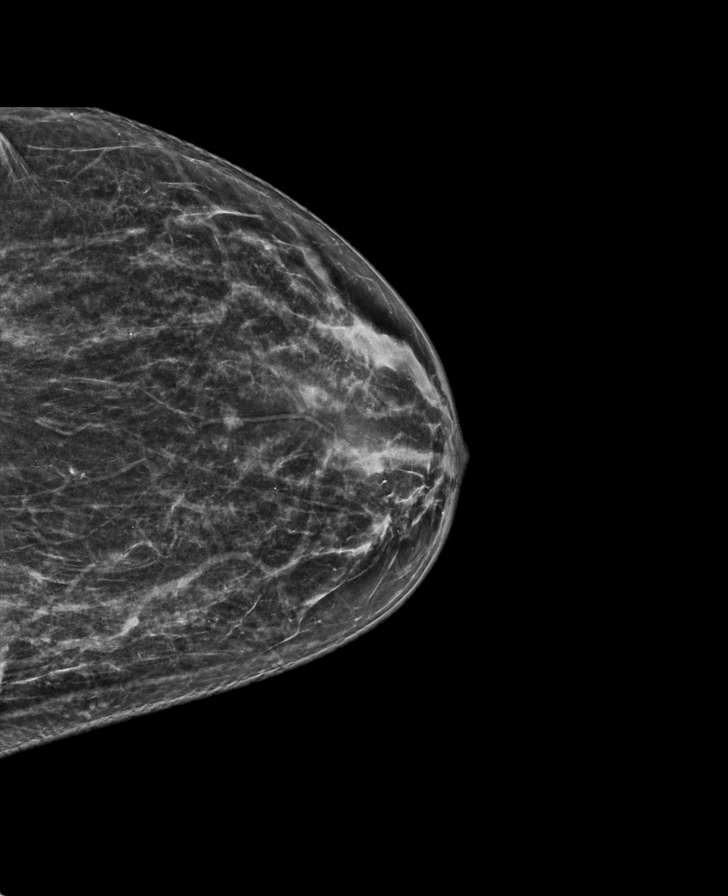

[L TAN synth-2D]
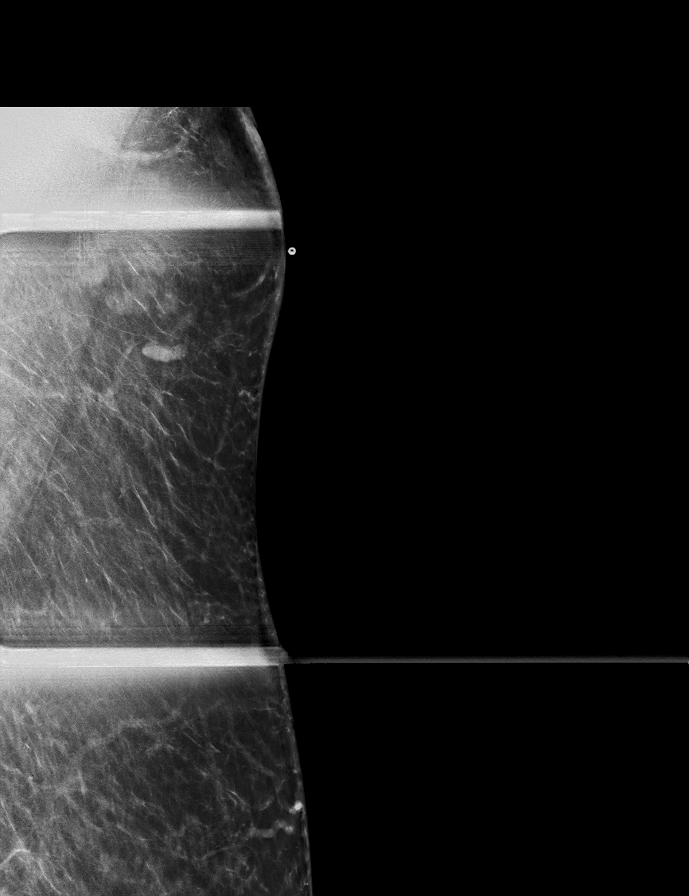

[R MLO synth-2D]
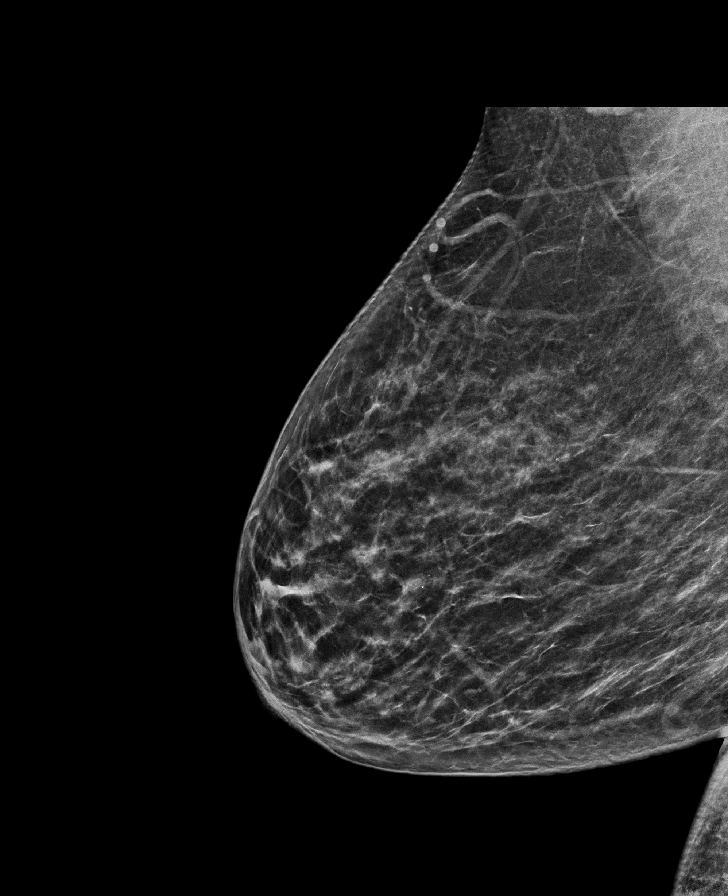

[L MLO synth-2D]
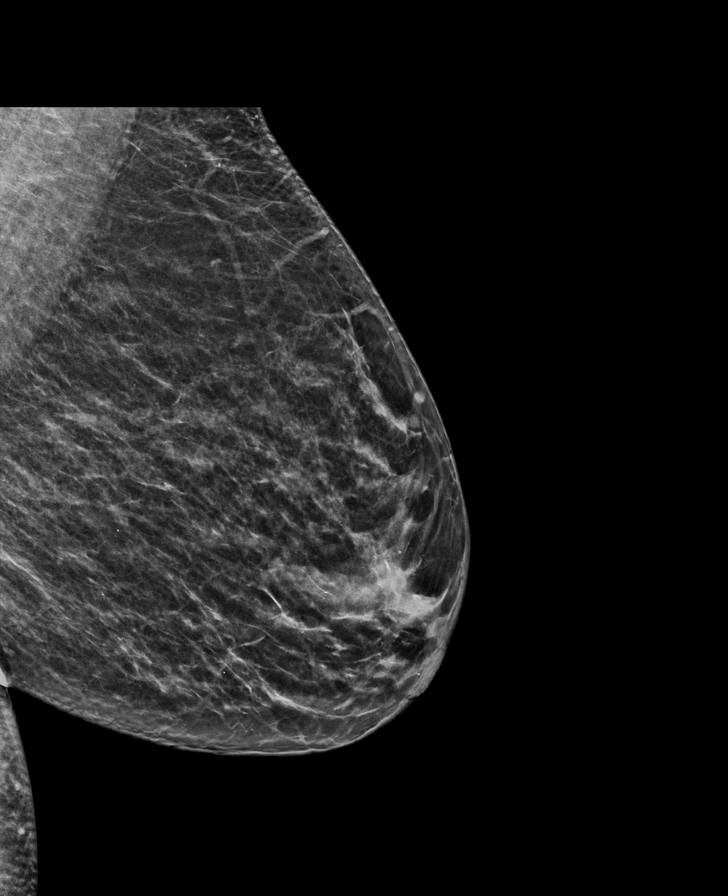

[7 of 40 positions shown; findings below may reference images not displayed]

ACR Breast Density Category c: The breast tissue is heterogeneously
dense, which may obscure small masses.
FINDINGS: No suspicious abnormality underlying the palpable marker within the
left axilla. There is persistent asymmetry within the retroareolar
left breast slightly laterally. No additional masses, calcifications
or distortion identified within either breast.

Mammographic images were processed with

Targeted ultrasound is performed, showing no discrete solid or
cystic mass within the left axilla at the site of palpable concern.
Normal left axillary lymph nodes are identified.

Within the left breast 2-3 o'clock position 1 cm from the nipple
dense fibroglandular tissue is visualized. Additionally, there is a
5 x 4 x 3 mm simple cyst.
IMPRESSION: 1. No suspicious abnormality at the site of palpable concern left
axilla.
2. Probably benign asymmetry within the lateral periareolar left
breast.

RECOMMENDATION:
Left breast diagnostic mammogram and possible ultrasound in 6 months
to ensure stability of probably benign left breast asymmetry.

Additionally, patient reports prior screening mammograms at an
outside facility in [place] [facility] [[facility]OSP[facility] An attempt will be
made to obtain these prior exams. If they are obtained, they will be
compared with the current exam and an addendum will be issued.

I have discussed the findings and recommendations with the patient.
If applicable, a reminder letter will be sent to the patient
regarding the next appointment.

BI-RADS CATEGORY  3: Probably benign.

ADDENDUM:
Outside prior mammograms of the left breast dated 01/16/2016 were
obtained and compared with current evaluation. Recommendation for
left breast diagnostic mammogram and possible ultrasound in 6 months
to ensure stability of probably benign left breast asymmetry remains
unchanged.

*** End of Addendum ***
ACR Breast Density Category c: The breast tissue is heterogeneously
dense, which may obscure small masses.
FINDINGS: No suspicious abnormality underlying the palpable marker within the
left axilla. There is persistent asymmetry within the retroareolar
left breast slightly laterally. No additional masses, calcifications
or distortion identified within either breast.

Mammographic images were processed with

Targeted ultrasound is performed, showing no discrete solid or
cystic mass within the left axilla at the site of palpable concern.
Normal left axillary lymph nodes are identified.

Within the left breast 2-3 o'clock position 1 cm from the nipple
dense fibroglandular tissue is visualized. Additionally, there is a
5 x 4 x 3 mm simple cyst.
IMPRESSION: 1. No suspicious abnormality at the site of palpable concern left
axilla.
2. Probably benign asymmetry within the lateral periareolar left
breast.

RECOMMENDATION:
Left breast diagnostic mammogram and possible ultrasound in 6 months
to ensure stability of probably benign left breast asymmetry.

Additionally, patient reports prior screening mammograms at an
outside facility in [place] [facility] [[facility]OS[facility]]. An attempt will be
made to obtain these prior exams. If they are obtained, they will be
compared with the current exam and an addendum will be issued.

I have discussed the findings and recommendations with the patient.
If applicable, a reminder letter will be sent to the patient
regarding the next appointment.

BI-RADS CATEGORY  3: Probably benign.

## 2020-06-15 IMAGING — US US BREAST*L* LIMITED INC AXILLA
1 series · 11 of 11 positions shown · non-contrast
Comparison: None
COMPARISON: None

Addendum:
CLINICAL DATA: Patient presents for palpable painful abnormality
within the left axilla.

EXAM:
DIGITAL DIAGNOSTIC BILATERAL MAMMOGRAM WITH CAD AND TOMO
ULTRASOUND LEFT BREAST

[Series 1: us breast*left* limited inc axilla · 0.07mm/px · 11 of 11 slices shown]
[im 1/11]
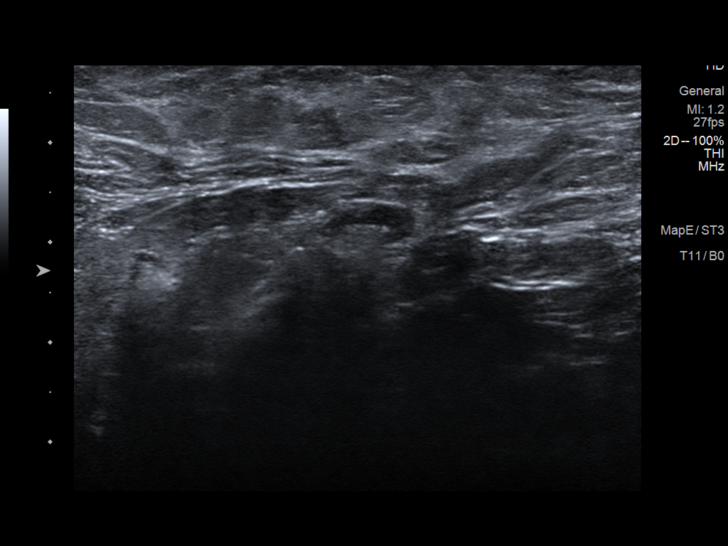
[im 2/11]
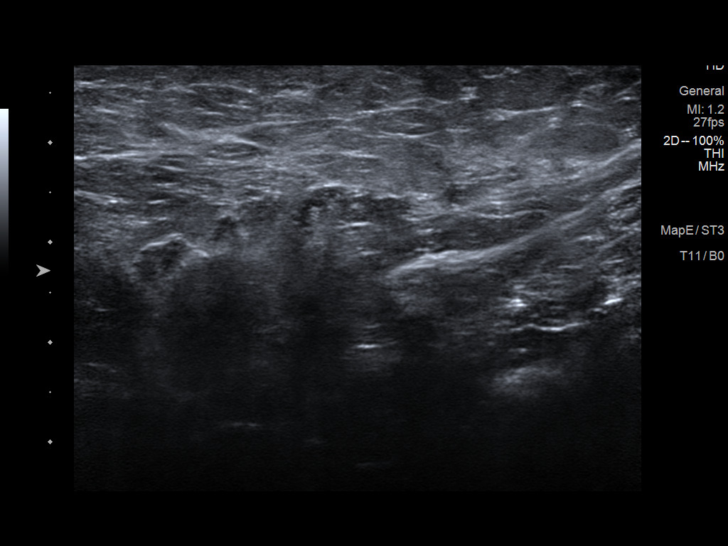
[im 3/11]
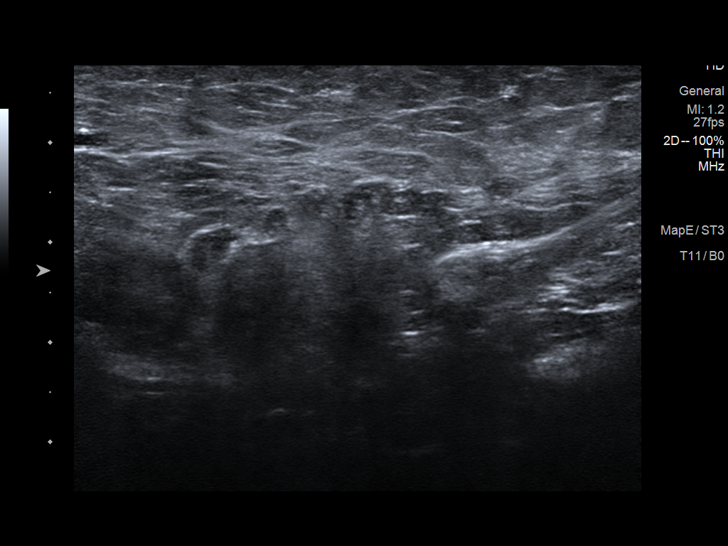
[im 4/11]
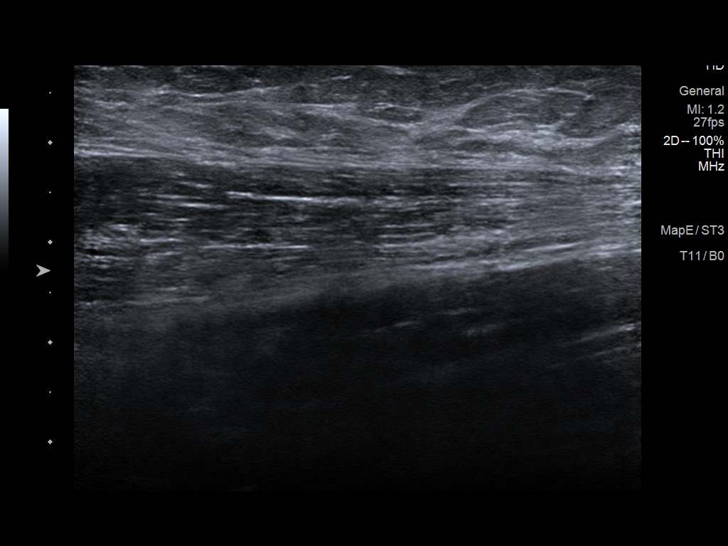
[im 5/11]
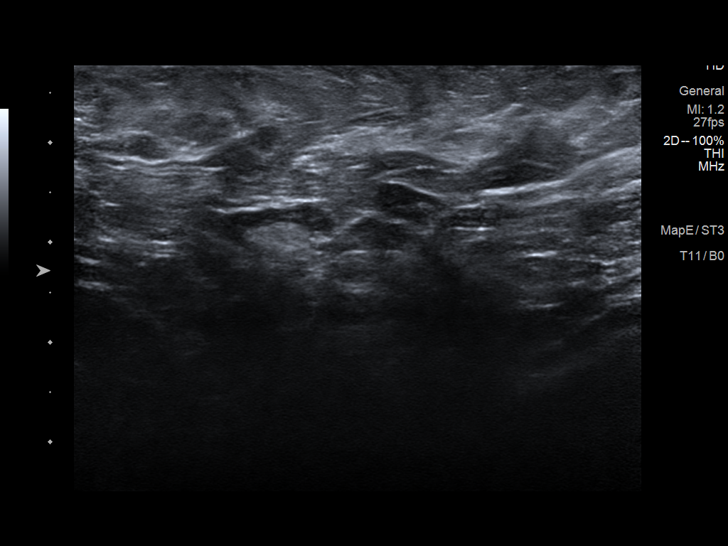
[im 6/11]
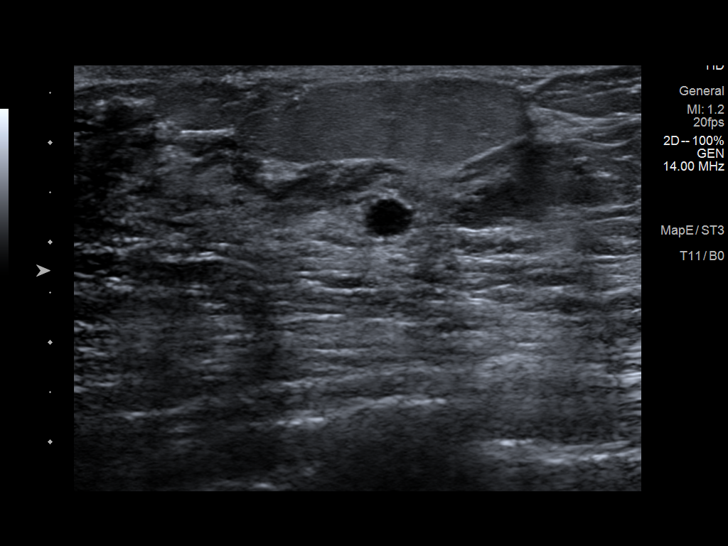
[im 7/11]
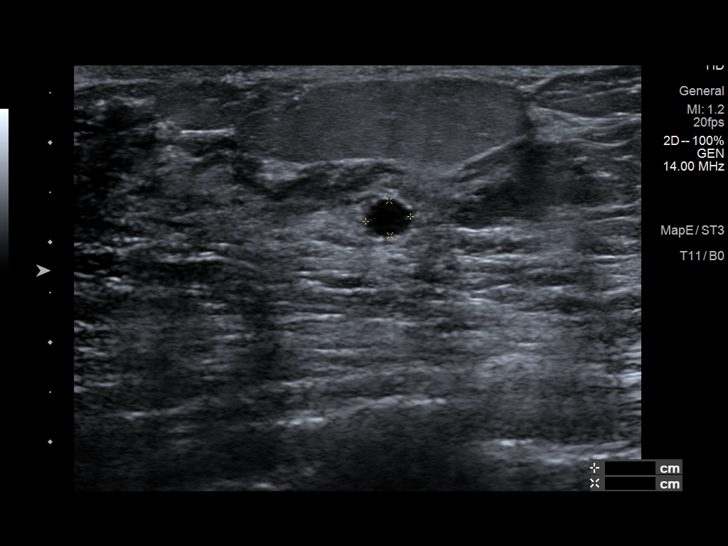
[im 8/11]
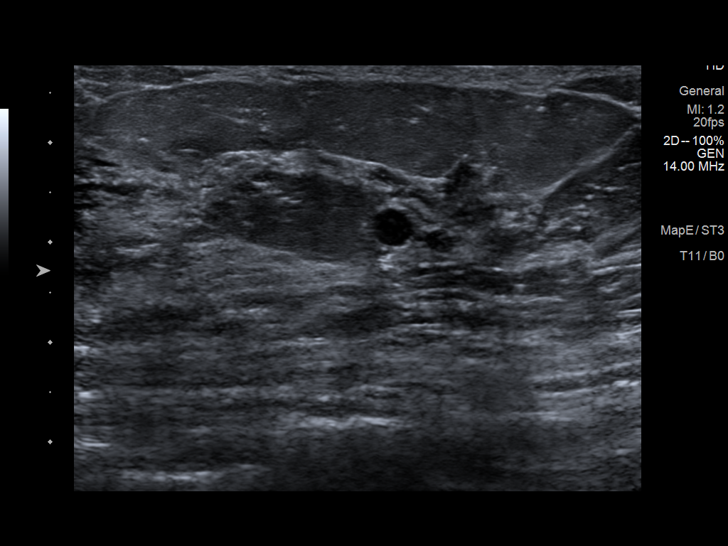
[im 9/11]
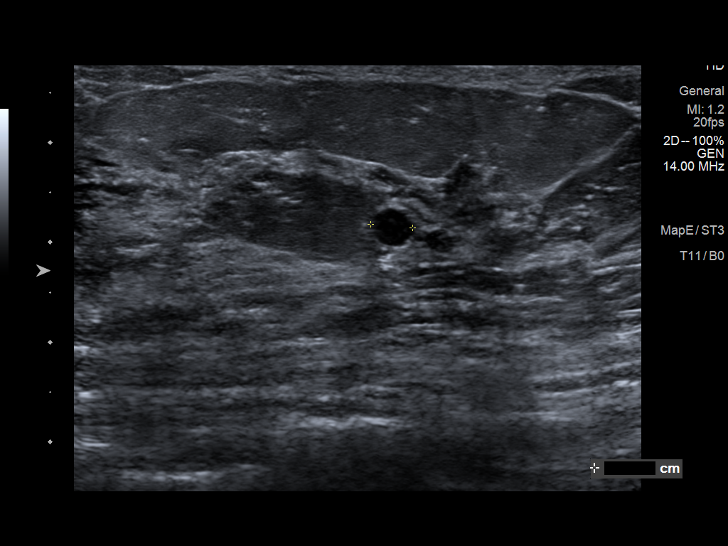
[im 10/11]
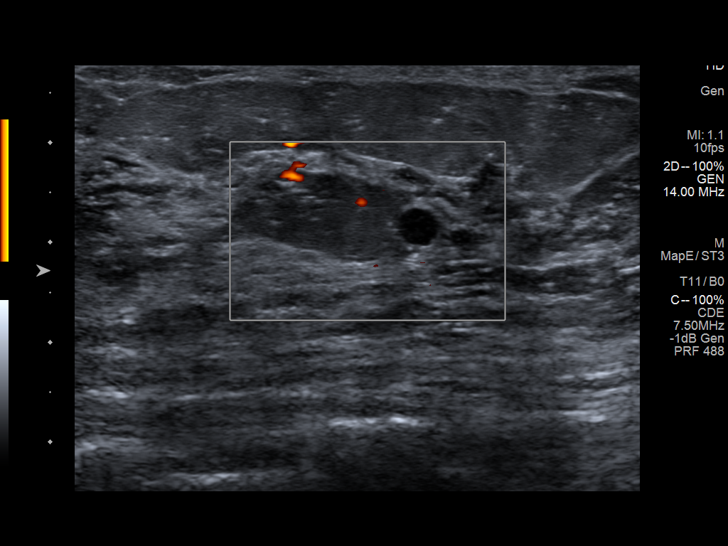
[im 11/11]
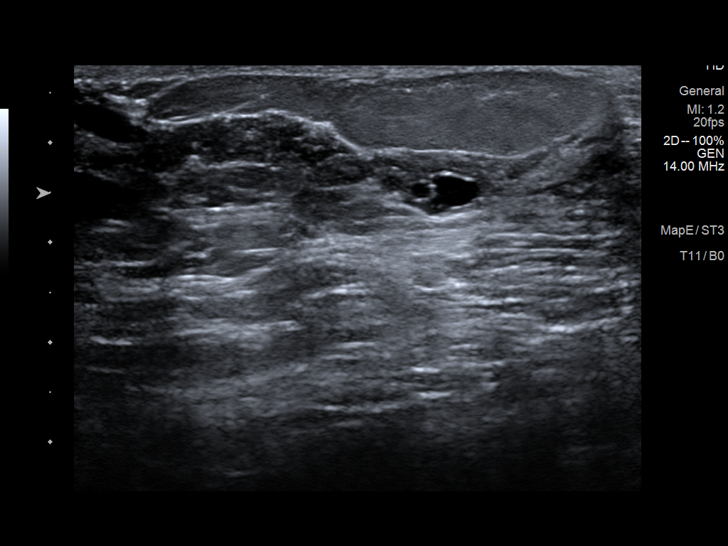

[11 of 11 positions shown; findings below may reference images not displayed]

ACR Breast Density Category c: The breast tissue is heterogeneously
dense, which may obscure small masses.
FINDINGS: No suspicious abnormality underlying the palpable marker within the
left axilla. There is persistent asymmetry within the retroareolar
left breast slightly laterally. No additional masses, calcifications
or distortion identified within either breast.

Mammographic images were processed with

Targeted ultrasound is performed, showing no discrete solid or
cystic mass within the left axilla at the site of palpable concern.
Normal left axillary lymph nodes are identified.

Within the left breast 2-3 o'clock position 1 cm from the nipple
dense fibroglandular tissue is visualized. Additionally, there is a
5 x 4 x 3 mm simple cyst.
IMPRESSION: 1. No suspicious abnormality at the site of palpable concern left
axilla.
2. Probably benign asymmetry within the lateral periareolar left
breast.

RECOMMENDATION:
Left breast diagnostic mammogram and possible ultrasound in 6 months
to ensure stability of probably benign left breast asymmetry.

Additionally, patient reports prior screening mammograms at an
outside facility in [place] [[facility]]. An attempt will be
made to obtain these prior exams. If they are obtained, they will be
compared with the current exam and an addendum will be issued.

I have discussed the findings and recommendations with the patient.
If applicable, a reminder letter will be sent to the patient
regarding the next appointment.

BI-RADS CATEGORY  3: Probably benign.

ADDENDUM:
Outside prior mammograms of the left breast dated 01/16/2016 were
obtained and compared with current evaluation. Recommendation for
left breast diagnostic mammogram and possible ultrasound in 6 months
to ensure stability of probably benign left breast asymmetry remains
unchanged.

*** End of Addendum ***
ACR Breast Density Category c: The breast tissue is heterogeneously
dense, which may obscure small masses.
FINDINGS: No suspicious abnormality underlying the palpable marker within the
left axilla. There is persistent asymmetry within the retroareolar
left breast slightly laterally. No additional masses, calcifications
or distortion identified within either breast.

Mammographic images were processed with

Targeted ultrasound is performed, showing no discrete solid or
cystic mass within the left axilla at the site of palpable concern.
Normal left axillary lymph nodes are identified.

Within the left breast 2-3 o'clock position 1 cm from the nipple
dense fibroglandular tissue is visualized. Additionally, there is a
5 x 4 x 3 mm simple cyst.
IMPRESSION: 1. No suspicious abnormality at the site of palpable concern left
axilla.
2. Probably benign asymmetry within the lateral periareolar left
breast.

RECOMMENDATION:
Left breast diagnostic mammogram and possible ultrasound in 6 months
to ensure stability of probably benign left breast asymmetry.

Additionally, patient reports prior screening mammograms at an
outside facility in [place] [[facility]]. An attempt will be
made to obtain these prior exams. If they are obtained, they will be
compared with the current exam and an addendum will be issued.

I have discussed the findings and recommendations with the patient.
If applicable, a reminder letter will be sent to the patient
regarding the next appointment.

BI-RADS CATEGORY  3: Probably benign.

## 2020-06-19 ENCOUNTER — Other Ambulatory Visit: Payer: Self-pay

## 2020-06-19 DIAGNOSIS — I10 Essential (primary) hypertension: Secondary | ICD-10-CM

## 2020-06-20 ENCOUNTER — Other Ambulatory Visit: Payer: Self-pay | Admitting: Critical Care Medicine

## 2020-06-20 ENCOUNTER — Telehealth: Payer: Self-pay

## 2020-06-20 MED ORDER — HYOSCYAMINE SULFATE 0.125 MG PO TABS: 0.1250 mg | ORAL_TABLET | ORAL | 0 refills | Status: DC | PRN

## 2020-06-20 MED ORDER — ATORVASTATIN CALCIUM 40 MG PO TABS: 40.0000 mg | ORAL_TABLET | Freq: Every day | ORAL | 1 refills | Status: DC

## 2020-06-20 MED ORDER — LOSARTAN POTASSIUM 100 MG PO TABS
100.0000 mg | ORAL_TABLET | Freq: Every day | ORAL | 0 refills | Status: DC
Start: 2020-06-20 — End: 2020-11-25

## 2020-06-20 MED ORDER — DAPAGLIFLOZIN PROPANEDIOL 10 MG PO TABS: 10.0000 mg | ORAL_TABLET | Freq: Every day | ORAL | 2 refills | Status: DC

## 2020-06-20 MED ORDER — EMPAGLIFLOZIN 10 MG PO TABS: 10.0000 mg | ORAL_TABLET | Freq: Every day | ORAL | 6 refills | Status: DC

## 2020-06-20 MED FILL — HYOSCYAMINE SULF 0.125 MG T: 0.125 | 5 days supply | Qty: 30 | Fill #0

## 2020-06-20 NOTE — Addendum Note (Signed)
Addended by: Asencion Noble E on: 06/20/2020 11:34 AM   Modules accepted: Orders

## 2020-06-20 NOTE — Telephone Encounter (Signed)
39-month free trial for [place].  Note for technician to remind patient at pick-up to cancel ins if appropriate before next refill.  Patient's copay is over $400 for [place].  The [name] is replacing [facility] at this time for cost reasons.  Patient had indicated that she was in the process of canceling her ins.

## 2020-06-20 NOTE — Telephone Encounter (Signed)
Pt has insurance and high copays.  Suzanne Cline is $408 with a copay savings card.  Patient can not afford.  Her application for patient assistance was denied because [employer] determined that her reported income exceeded current program eligibility limits.  I spoke to the patient on the phone today and she was upset that we 'found' her ins.  She states her ins has little to no pharmacy benefits and she knows her copays are high.  She stated she was canceling her ins.  I advised that our pharmacy could not use the $4 or $10 [place] pricing until her insurance was canceled and we would need documentation of cancelation if it is still showing active on our end.  I asked if she would like me to contact her MD to see about changing the medication if appropriate and she said "do what you have to do".  [name] is the only other preferred drug on her plan, but the copay is $560.93.  There is a coupon that would take $175 off of this.  But, there is also 68-month free trial for the [place] that we could use that might buy the patient time to get her insurance canceled.

## 2020-06-20 NOTE — Telephone Encounter (Signed)
Farxiga sent to our pharmacy

## 2020-06-21 MED ORDER — FUROSEMIDE 20 MG PO TABS: 20.0000 mg | ORAL_TABLET | Freq: Every day | ORAL | 2 refills | Status: DC | PRN

## 2020-06-21 MED ORDER — ACYCLOVIR 400 MG PO TABS: 400.0000 mg | ORAL_TABLET | Freq: Every day | ORAL | 1 refills | Status: DC

## 2020-06-21 MED ORDER — EMPAGLIFLOZIN 10 MG PO TABS: 10.0000 mg | ORAL_TABLET | Freq: Every day | ORAL | 2 refills | Status: DC

## 2020-06-21 MED ORDER — HYOSCYAMINE SULFATE 0.125 MG PO TABS: 0.1250 mg | ORAL_TABLET | ORAL | 0 refills | Status: DC | PRN

## 2020-06-21 MED FILL — FARXIGA 10 MG TABLET: 10 | 30 days supply | Qty: 30 | Fill #0

## 2020-06-21 NOTE — Addendum Note (Signed)
Addended by: [name] on: 06/21/2020 08:02 AM   Modules accepted: Orders

## 2020-06-30 NOTE — Progress Notes (Addendum)
 Subjective:    Patient ID: Suzanne Cline, female    DOB: 04/21/1966, 55 y.o.   MRN: [id number]  55 y.o.F PCP to est former pt of dr Fulp Hx of HFpEF, HTN, MVR, T2DM, HLD, tobacco,GERD with reflux esophagitis without bleeding on 09/2019 EGD with [facility]  This patient is seen in follow-up and to establish primary care.  The patient formally was a patient of Dr. Fulp who is no longer in the practice and the patient is here now for the first visit with me.  Patient history dates back and that she has hypertension, heart failure with preserved ejection fraction EF 65% with thickened left ventricle and diastolic heart failure, mitral regurgitation, type 2 diabetes, hyperlipidemia, tobacco use, reflux disease with recent upper endoscopy biopsy showing reflux esophagitis only   Patient has longstanding issues of upper abdominal pain left upper quadrant and epigastric pain she has constipation with this as well it feels like a tightness in the epigastric area and radiates into the back.  She has been taking once daily Nexium without much improvement in the symptoms.  She has 1 bowel movement every 3 to 4 days.  She does vape CBD oil but is no longer smoking cigarettes.  Patient is on thyroid supplementation at this time.  Note on arrival hemoglobin A1c was 6.1.  She is on the Lantus 16 units daily and also glipizide twice daily.  Blood sugar on arrival today was 78.  She notes she misses meals on several occasions.  Note this patient needs a mammogram hepatitis C and HIV screening.  She also needs colon cancer screening.  Note this patient is self-pay.  This patient also has a history of yet to receive Covid vaccine and is also refusing flu vaccine at this visit.  Patient still has a lot of misinformation and fears about the Covid vaccine that she states.  Note the patient does not check her blood sugar on a regular basis.  She will miss many meals.  The patient does get refills from [facility]  program and is requesting many of her chronic medications be sent to that location with 90-day refills   04/30/20 This patient is seen in short-term follow-up and is still having abdominal pain now radiating down to the pelvic area with incontinence.  She is now having bowel movements at least more often 1-2 times daily instead of every 3 to 4 days.  Despite being on high-dose Nexium the abdominal pain persists.  She states it is a pulling tightness sensation a sensation of fullness and pushing upwards towards the chest from the lower abdomen.  She does have a large uterine fibroid that is needed to be removed with hysterectomy.  She was awaiting clearance from her other physicians.  I pick this patient up in December and now that I have a better understanding of her cardiac status and the fact that her diabetes is well controlled with hemoglobin A1c down to 6.1 we can expedite referral back to gynecology for consideration of hysterectomy.  The patient on arrival has a blood pressure 132/87 which is in good range.  The patient maintains Synthroid for postoperative hypothyroidism.  She is maintaining Lantus 20 units daily and Jardiance 10 mg daily blood sugars have been in the 110 range she is now off the glipizide.   We discussed again during the interview the importance of a Covid vaccine she is now willing to proceed and request a Pfizer vaccine.  The patient also is   yet to turn in all get as it was not processed and instead was mailed in and somehow lost so a new kit will have to be issued  07/01/20 This patient is seen in return follow-up and on arrival is quite distressed initial blood pressure was 163/97 and when was down to 145/67.  Patient states that she has had a job change and she is now underinsured and cannot afford some of the medication.  She did get the [place] but says she prefers the [place].  She complains of abdominal pain that is in the epigastric and lower abdominal areas.  She has  history of gastric antritis and also history of esophagitis.  In addition to this the patient has history of uterine fibroids which causes lower abdominal pain.  She states after eating she will get diarrhea and also have abdominal swelling.  She states when she was on Carafate in the past this improves her symptoms.  She is not been taking Nexium on a regular basis.  On arrival CBG was 158 and A1c was 6.9.  Her insurance would not cover Jardiance she is now on for [place] along with the Lantus and she has been taking both daily however there was a covrage gap   For blood pressure the patient states she has been compliant with losartan, Coreg, spironolactone.  Also has been compliant with atorvastatin and Synthroid.  Also taking gabapentin for neuropathy.  She states the Levsin has helped her abdominal pain as well.  The patient is yet to obtain a Covid booster she states she will receive that this week  Past Medical History:  Diagnosis Date  . CHF (congestive heart failure) ([place])   . Diabetes mellitus without complication ([place])   . Fibroids, intramural    s/p uterine artery embolization  . Former cigarette smoker 04/2011  . History of Resolved Nonischemic Congestive Cardiomyopathy ([place]) 04/2011   Echo March 2013: EF 35% with mild/moderate diastolic dysfunction -> Follow-Up Echo March 2014 EF 55% with G1 DD.  [place] Kitchen Hypertension   . Hypertensive heart disease    Difficult control hypertension  . Mild concentric left ventricular hypertrophy (LVH) 10/08/2019  . Moderate mitral valve regurgitation 10/08/2019   Echo done June 2021; Dr. Ellyn Hack  . Neuromuscular disorder ([place])    diabetic neuropathy  . OSA (obstructive sleep apnea)   . Sleep apnea    doesn't use cpap regularly  . Thyroid disease    post thyroidectomy     Family History  Problem Relation Age of Onset  . Congestive Heart Failure Mother   . Breast cancer Neg Hx   . Colon cancer Neg Hx   . Esophageal cancer Neg Hx   . Rectal  cancer Neg Hx   . Stomach cancer Neg Hx   . Inflammatory bowel disease Neg Hx   . Liver disease Neg Hx   . Pancreatic cancer Neg Hx      Social History   Socioeconomic History  . Marital status: Single    Spouse name: Not on file  . Number of children: Not on file  . Years of education: Not on file  . Highest education level: Not on file  Occupational History  . Not on file  Tobacco Use  . Smoking status: Former Smoker    Types: Cigars    Quit date: 03/2019    Years since quitting: 1.2  . Smokeless tobacco: Never Used  . Tobacco comment: smokes occasional cigar  Vaping Use  . Vaping Use:  Never used  Substance and Sexual Activity  . Alcohol use: Yes    Comment: social  . Drug use: Never  . Sexual activity: Not Currently  Other Topics Concern  . Not on file  Social History Narrative  . Not on file   Social Determinants of Health   Financial Resource Strain: Not on file  Food Insecurity: Not on file  Transportation Needs: Not on file  Physical Activity: Not on file  Stress: Not on file  Social Connections: Not on file  Intimate Partner Violence: Not on file     Allergies  Allergen Reactions  . Lisinopril Cough  . Metformin And Related Diarrhea and Other (See Comments)    Diarrhea and GI upset on IR 1 g BID. Trialed IR 500 mg BID with no improvement.  . Oxycodone Itching     Outpatient Medications Prior to Visit  Medication Sig Dispense Refill  . acyclovir (ZOVIRAX) 400 MG tablet Take 1 tablet (400 mg total) by mouth 5 (five) times daily. 60 tablet 1  . atorvastatin (LIPITOR) 40 MG tablet Take 1 tablet (40 mg total) by mouth daily. 90 tablet 1  . Blood Glucose Monitoring Suppl (TRUE METRIX METER) w/Device KIT Use as directed 1 kit 0  . carvedilol (COREG) 25 MG tablet Take 1 tablet (25 mg total) by mouth 2 (two) times daily with a meal. 180 tablet 0  . esomeprazole (NEXIUM) 40 MG capsule Take 1 capsule (40 mg total) by mouth 2 (two) times daily before a meal.  180 capsule 1  . furosemide (LASIX) 20 MG tablet Take 1 tablet (20 mg total) by mouth daily as needed. 60 tablet 2  . gabapentin (NEURONTIN) 300 MG capsule Take 2 capsules (600 mg total) by mouth 2 (two) times daily. 180 capsule 1  . glucose blood (TRUE METRIX BLOOD GLUCOSE TEST) test strip Use as instructed 100 each 12  . hyoscyamine (LEVSIN) 0.125 MG tablet Take 1 tablet (0.125 mg total) by mouth every 4 (four) hours as needed. 30 tablet 0  . insulin glargine (LANTUS) 100 unit/mL SOPN Inject 20 Units into the skin at bedtime. 6 mL 2  . losartan (COZAAR) 100 MG tablet Take 1 tablet (100 mg total) by mouth daily. 90 tablet 0  . senna-docusate (SENOKOT-S) 8.6-50 MG tablet Take 1 tablet by mouth daily. 60 tablet 6  . spironolactone (ALDACTONE) 25 MG tablet Take 1 tablet (25 mg total) by mouth daily. 30 tablet 2  . SYNTHROID 150 MCG tablet Take 1 tablet (150 mcg total) by mouth daily. 90 tablet 0  . TRUEplus Lancets 28G MISC Use as directed 100 each 4  . FARXIGA 10 MG TABS tablet Take 10 mg by mouth every morning.    . empagliflozin (JARDIANCE) 10 MG TABS tablet Take 1 tablet (10 mg total) by mouth daily. (Patient not taking: Reported on 07/01/2020) 60 tablet 2   No facility-administered medications prior to visit.       Review of Systems  HENT: Negative.   Eyes: Negative for visual disturbance.  Respiratory: Positive for cough and wheezing. Negative for shortness of breath.   Cardiovascular: Negative for chest pain, palpitations and leg swelling.  Gastrointestinal: Positive for abdominal distention, abdominal pain, constipation and vomiting. Negative for anal bleeding, blood in stool, diarrhea and nausea.       No gerd   Genitourinary: Positive for pelvic pain. Negative for difficulty urinating, dysuria and vaginal bleeding.  Skin: Positive for rash.  Neurological: Negative.   Psychiatric/Behavioral: Negative  for dysphoric mood. The patient is not nervous/anxious.        Objective:    Physical Exam  Vitals:   07/01/20 0844 07/01/20 0845  BP: (!) 163/97 (!) 145/67  Pulse: 68   SpO2: 99%   Weight: 158 lb (71.7 kg)   Height: $Remove'5\' 3"'EYvKEVr$  (1.6 m)     Gen: Pleasant, well-nourished, in no distress,  normal affect  ENT: No lesions,  mouth clear,  oropharynx clear, no postnasal drip  Neck: No JVD, no TMG, no carotid bruits  Lungs: No use of accessory muscles, no dullness to percussion, clear without rales or rhonchi  Cardiovascular: RRR, heart sounds normal, no murmur or gallops, no peripheral edema  Abdomen: soft and tender epigastric area,  no HSM,  BS normal  Musculoskeletal: No deformities, no cyanosis or clubbing  Neuro: alert, non focal  Skin: Warm, no lesions or rashes  No results found. Lab Results  Component Value Date   HGBA1C 6.9 07/01/2020   Hemoglobin A1c today is 6.9      Assessment & Plan:  I personally reviewed all images and lab data in the [facility] system as well as any outside material available during this office visit and agree with the  radiology impressions.   Essential hypertension Blood pressure not as well-controlled today exacerbated by underlying stress  Continue current medications without change  Esophageal eosinophilia Esophageal eosinophilia with associated chronic abdominal pain gastritis type symptoms  Resume Carafate 4 times daily and resume Nexium twice daily  Referral back to gastroenterology was made  Type 2 diabetes mellitus without complication, with long-term current use of insulin (HCC) A1c6.9 , note was off jardiance and a gap until she start [place] Patient is now on Farxiga and Lantus as her insurance would not cover [employer] daily along with Lantus 20 units daily  Fibroids Uterine fibroids  Follow-up per gynecology  Obtain Pap smear at next gynecology office visit   Zerenity was seen today for diabetes.  Diagnoses and all orders for this visit:  Type 2 diabetes mellitus without  complication, with long-term current use of insulin (HCC) -     POCT glucose (manual entry) -     POCT glycosylated hemoglobin (Hb A1C)  Esophageal eosinophilia -     Ambulatory referral to Gastroenterology  Epigastric pain -     Ambulatory referral to Gastroenterology  Essential hypertension  Fibroids  Other orders -     sucralfate (CARAFATE) 1 g tablet; Take 1 tablet (1 g total) by mouth 4 (four) times daily -  with meals and at bedtime. -     Cancel: Tdap vaccine greater than or equal to 7yo IM  Patient reminded she needs to obtain a mammogram  Patient given resources to obtain an eye exam with her diabetes  Patient declined to receive Tdap at this visit  Patient states she will pursue Covid vaccine first and then get her Tdap later

## 2020-07-01 ENCOUNTER — Other Ambulatory Visit: Payer: Self-pay

## 2020-07-01 ENCOUNTER — Encounter: Payer: Self-pay | Admitting: Critical Care Medicine

## 2020-07-01 ENCOUNTER — Other Ambulatory Visit: Payer: Self-pay | Admitting: Critical Care Medicine

## 2020-07-01 ENCOUNTER — Ambulatory Visit: Payer: BC Managed Care – PPO | Attending: Critical Care Medicine | Admitting: Critical Care Medicine

## 2020-07-01 ENCOUNTER — Telehealth: Payer: Self-pay

## 2020-07-01 VITALS — BP 145/67 | HR 68 | Ht 63.0 in | Wt 158.0 lb

## 2020-07-01 DIAGNOSIS — Z794 Long term (current) use of insulin: Secondary | ICD-10-CM | POA: Diagnosis not present

## 2020-07-01 DIAGNOSIS — D219 Benign neoplasm of connective and other soft tissue, unspecified: Secondary | ICD-10-CM

## 2020-07-01 DIAGNOSIS — I10 Essential (primary) hypertension: Secondary | ICD-10-CM

## 2020-07-01 DIAGNOSIS — R1013 Epigastric pain: Secondary | ICD-10-CM | POA: Diagnosis not present

## 2020-07-01 DIAGNOSIS — K200 Eosinophilic esophagitis: Secondary | ICD-10-CM | POA: Diagnosis not present

## 2020-07-01 DIAGNOSIS — E119 Type 2 diabetes mellitus without complications: Secondary | ICD-10-CM

## 2020-07-01 LAB — POCT GLYCOSYLATED HEMOGLOBIN (HGB A1C): HbA1c, POC (controlled diabetic range): 6.9 % (ref 0.0–7.0)

## 2020-07-01 LAB — GLUCOSE, POCT (MANUAL RESULT ENTRY): POC Glucose: 158 mg/dl — AB (ref 70–99)

## 2020-07-01 MED ORDER — SUCRALFATE 1 G PO TABS: 1.0000 g | ORAL_TABLET | Freq: Three times a day (TID) | ORAL | 2 refills | Status: DC

## 2020-07-01 NOTE — Telephone Encounter (Signed)
Dr.Wright the a1c was entered in wrong I have corrected the results. The result was 6.9.

## 2020-07-01 NOTE — Assessment & Plan Note (Signed)
Blood pressure not as well-controlled today exacerbated by underlying stress  Continue current medications without change

## 2020-07-01 NOTE — Telephone Encounter (Signed)
-----   Message from [name], MD sent at 07/01/2020 12:18 PM EST ----- Angelina Sheriff  I thought you said a1c was 6.9   pls call the pt and tell her it was actually 6.1  much improved.  Tell her the farxiga and lantus are working !

## 2020-07-01 NOTE — Assessment & Plan Note (Addendum)
A1c6.9 , note was off jardiance and a gap until she start [place] Patient is now on Farxiga and Lantus as her insurance would not cover [employer] daily along with Lantus 20 units daily

## 2020-07-01 NOTE — Assessment & Plan Note (Signed)
Uterine fibroids  Follow-up per gynecology  Obtain Pap smear at next gynecology office visit

## 2020-07-01 NOTE — Assessment & Plan Note (Signed)
Esophageal eosinophilia with associated chronic abdominal pain gastritis type symptoms  Resume Carafate 4 times daily and resume Nexium twice daily  Referral back to gastroenterology was made

## 2020-07-01 NOTE — Patient Instructions (Signed)
A tetanus vaccine was given  Begin Carafate 1 4 times a day for your stomach and stay on the Nexium twice a day before meals follow reflux diet as noted below  Referral back to gastroenterology will be made  Follow-up with gynecology with regards to your fibroids  Ensure that you are taking the Farxiga and Lantus daily as prescribed  Return to see Dr. Joya Gaskins in 2 months   Food Choices for Gastroesophageal Reflux Disease, Adult When you have gastroesophageal reflux disease (GERD), the foods you eat and your eating habits are very important. Choosing the right foods can help ease the discomfort of GERD. Consider working with a dietitian to help you make healthy food choices. What are tips for following this plan? Reading food labels  Look for foods that are low in saturated fat. Foods that have less than 5% of daily value (DV) of fat and 0 g of trans fats may help with your symptoms. Cooking  Cook foods using methods other than frying. This may include baking, steaming, grilling, or broiling. These are all methods that do not need a lot of fat for cooking.  To add flavor, try to use herbs that are low in spice and acidity. Meal planning  Choose healthy foods that are low in fat, such as fruits, vegetables, whole grains, low-fat dairy products, lean meats, fish, and poultry.  Eat frequent, small meals instead of three large meals each day. Eat your meals slowly, in a relaxed setting. Avoid bending over or lying down until 2-3 hours after eating.  Limit high-fat foods such as fatty meats or fried foods.  Limit your intake of fatty foods, such as oils, butter, and shortening.  Avoid the following as told by your health care provider: ? Foods that cause symptoms. These may be different for different people. Keep a food diary to keep track of foods that cause symptoms. ? Alcohol. ? Drinking large amounts of liquid with meals. ? Eating meals during the 2-3 hours before bed.    Lifestyle  Maintain a healthy weight. Ask your health care provider what weight is healthy for you. If you need to lose weight, work with your health care provider to do so safely.  Exercise for at least 30 minutes on 5 or more days each week, or as told by your health care provider.  Avoid wearing clothes that fit tightly around your waist and chest.  Do not use any products that contain nicotine or tobacco. These products include cigarettes, chewing tobacco, and vaping devices, such as e-cigarettes. If you need help quitting, ask your health care provider.  Sleep with the head of your bed raised. Use a wedge under the mattress or blocks under the bed frame to raise the head of the bed.  Chew sugar-free gum after mealtimes. What foods should I eat? Eat a healthy, well-balanced diet of fruits, vegetables, whole grains, low-fat dairy products, lean meats, fish, and poultry. Each person is different. Foods that may trigger symptoms in one person may not trigger any symptoms in another person. Work with your health care provider to identify foods that are safe for you. The items listed above may not be a complete list of recommended foods and beverages. Contact a dietitian for more information.   What foods should I avoid? Limiting some of these foods may help manage the symptoms of GERD. Everyone is different. Consult a dietitian or your health care provider to help you identify the exact foods to avoid, if any.  Fruits Any fruits prepared with added fat. Any fruits that cause symptoms. For some people this may include citrus fruits, such as oranges, grapefruit, pineapple, and lemons. Vegetables Deep-fried vegetables. [place] fries. Any vegetables prepared with added fat. Any vegetables that cause symptoms. For some people, this may include tomatoes and tomato products, chili peppers, onions and garlic, and horseradish. Grains Pastries or quick breads with added fat. Meats and other  proteins High-fat meats, such as fatty beef or pork, hot dogs, ribs, ham, sausage, salami, and bacon. Fried meat or protein, including fried fish and fried chicken. Nuts and nut butters, in large amounts. Dairy Whole milk and chocolate milk. Sour cream. Cream. Ice cream. Cream cheese. Milkshakes. Fats and oils Butter. Margarine. Shortening. Ghee. Beverages Coffee and tea, with or without caffeine. Carbonated beverages. Sodas. Energy drinks. Fruit juice made with acidic fruits, such as orange or grapefruit. Tomato juice. Alcoholic drinks. Sweets and desserts Chocolate and cocoa. Donuts. Seasonings and condiments Pepper. Peppermint and spearmint. Added salt. Any condiments, herbs, or seasonings that cause symptoms. For some people, this may include curry, hot sauce, or vinegar-based salad dressings. The items listed above may not be a complete list of foods and beverages to avoid. Contact a dietitian for more information. Questions to ask your health care provider Diet and lifestyle changes are usually the first steps that are taken to manage symptoms of GERD. If diet and lifestyle changes do not improve your symptoms, talk with your health care provider about taking medicines. Where to find more information  International Foundation for Gastrointestinal Disorders: aboutgerd.org Summary  When you have gastroesophageal reflux disease (GERD), food and lifestyle choices may be very helpful in easing the discomfort of GERD.  Eat frequent, small meals instead of three large meals each day. Eat your meals slowly, in a relaxed setting. Avoid bending over or lying down until 2-3 hours after eating.  Limit high-fat foods such as fatty meats or fried foods. This information is not intended to replace advice given to you by your health care provider. Make sure you discuss any questions you have with your health care provider. Document Revised: 10/23/2019 Document Reviewed: 10/23/2019 Elsevier Patient  Education  [place].

## 2020-07-01 NOTE — Telephone Encounter (Signed)
Noted,  I have amended the chart

## 2020-07-27 ENCOUNTER — Other Ambulatory Visit: Payer: Self-pay

## 2020-08-07 ENCOUNTER — Other Ambulatory Visit: Payer: Self-pay

## 2020-08-07 ENCOUNTER — Other Ambulatory Visit: Payer: Self-pay | Admitting: Pharmacist

## 2020-08-07 DIAGNOSIS — I10 Essential (primary) hypertension: Secondary | ICD-10-CM

## 2020-08-07 MED ORDER — SPIRONOLACTONE 25 MG PO TABS: 25.0000 mg | ORAL_TABLET | Freq: Every day | ORAL | 2 refills | Status: DC

## 2020-08-07 MED ORDER — HYOSCYAMINE SULFATE 0.125 MG PO TABS: 0.1250 mg | ORAL_TABLET | ORAL | 0 refills | Status: DC | PRN

## 2020-08-12 ENCOUNTER — Telehealth: Payer: Self-pay | Admitting: *Deleted

## 2020-08-12 NOTE — Telephone Encounter (Signed)
I called and informed Suzanne Cline of mammogram appointment. She states she needs appointment to be earlier and be done by 10:00. I called [facility] and rescheduled her mammogram for 10/02/20 at 0800, arrive 0745. I called Suzanne Cline and informed her of new appointment. She voices understanding. Tinzley Dalia,RN

## 2020-08-12 NOTE — Telephone Encounter (Signed)
-----   Message from Luvenia Redden, PA-C sent at 08/12/2020  2:37 PM EDT ----- Hello  Can someone please schedule mammogram for this patient? It was ordered at a recent visit.   Thanks,   Almyra Free

## 2020-08-12 NOTE — Telephone Encounter (Signed)
I called and scheduled mammogram for first available appointment 06/ 07 /22 12:20 , arrive at 12:00. Lutricia Widjaja,RN

## 2020-09-02 ENCOUNTER — Ambulatory Visit: Payer: BC Managed Care – PPO | Admitting: Critical Care Medicine

## 2020-09-09 ENCOUNTER — Ambulatory Visit: Payer: Self-pay | Admitting: Obstetrics and Gynecology

## 2020-09-24 ENCOUNTER — Other Ambulatory Visit: Payer: Self-pay | Admitting: Critical Care Medicine

## 2020-10-01 ENCOUNTER — Ambulatory Visit: Payer: BC Managed Care – PPO

## 2020-10-02 ENCOUNTER — Ambulatory Visit: Payer: BC Managed Care – PPO

## 2020-10-03 ENCOUNTER — Emergency Department (HOSPITAL_COMMUNITY)
Admission: EM | Admit: 2020-10-03 | Discharge: 2020-10-03 | Disposition: A | Discharge status: Home / Self Care | Payer: Self-pay | Attending: Emergency Medicine | Admitting: Emergency Medicine

## 2020-10-03 ENCOUNTER — Encounter (HOSPITAL_COMMUNITY): Payer: Self-pay | Admitting: Emergency Medicine

## 2020-10-03 ENCOUNTER — Other Ambulatory Visit: Payer: Self-pay

## 2020-10-03 DIAGNOSIS — I110 Hypertensive heart disease with heart failure: Secondary | ICD-10-CM | POA: Insufficient documentation

## 2020-10-03 DIAGNOSIS — I5030 Unspecified diastolic (congestive) heart failure: Secondary | ICD-10-CM | POA: Insufficient documentation

## 2020-10-03 DIAGNOSIS — Z79899 Other long term (current) drug therapy: Secondary | ICD-10-CM | POA: Insufficient documentation

## 2020-10-03 DIAGNOSIS — Z955 Presence of coronary angioplasty implant and graft: Secondary | ICD-10-CM | POA: Insufficient documentation

## 2020-10-03 DIAGNOSIS — K115 Sialolithiasis: Secondary | ICD-10-CM | POA: Insufficient documentation

## 2020-10-03 DIAGNOSIS — E1142 Type 2 diabetes mellitus with diabetic polyneuropathy: Secondary | ICD-10-CM | POA: Insufficient documentation

## 2020-10-03 DIAGNOSIS — E039 Hypothyroidism, unspecified: Secondary | ICD-10-CM | POA: Insufficient documentation

## 2020-10-03 DIAGNOSIS — Z87891 Personal history of nicotine dependence: Secondary | ICD-10-CM | POA: Insufficient documentation

## 2020-10-03 NOTE — ED Triage Notes (Signed)
Pt states that she started about 30 minutes ago having swelling to her L jaw/neck region. Denies pain or airway trouble. Alert and oriented.

## 2020-10-03 NOTE — ED Provider Notes (Signed)
[facility] DEPT Provider Note   CSN: [id number] Arrival date & time: 10/03/20  1826     History Chief Complaint  Patient presents with   Facial Swelling    Suzanne Cline is a 55 y.o. female.  HPI  55 year old female with a history of CHF, diabetes, fibroids, hypertension, LVH, mitral valve regurg, OSA, sleep apnea, who presents to the emergency department today for evaluation of swelling to the left side of the neck.  States that swelling started about 30 minutes prior to arrival.  She has no significant pain but she states when she was eating she felt like there was something stuck in her mouth underneath her tongue.  She denies any sore throat or difficulty swallowing.  She is never had similar symptoms before.  Past Medical History:  Diagnosis Date   CHF (congestive heart failure) ([place])    Diabetes mellitus without complication ([place])    Fibroids, intramural    s/p uterine artery embolization   Former cigarette smoker 04/2011   History of Resolved Nonischemic Congestive Cardiomyopathy ([place]) 04/2011   Echo March 2013: EF 35% with mild/moderate diastolic dysfunction -> Follow-Up Echo March 2014 EF 55% with G1 DD.   Hypertension    Hypertensive heart disease    Difficult control hypertension   Mild concentric left ventricular hypertrophy (LVH) 10/08/2019   Moderate mitral valve regurgitation 10/08/2019   Echo done June 2021; Dr. Ellyn Hack   Neuromuscular disorder [facility])    diabetic neuropathy   OSA (obstructive sleep apnea)    Sleep apnea    doesn't use cpap regularly   Thyroid disease    post thyroidectomy    Patient Active Problem List   Diagnosis Date Noted   Diabetic polyneuropathy associated with type 2 diabetes mellitus ([place]) 04/30/2020   Fibroids 10/18/2019   Moderate mitral valve regurgitation 10/08/2019   Mild concentric left ventricular hypertrophy (LVH) 10/08/2019   GERD (gastroesophageal reflux disease) 08/25/2019   Esophageal  eosinophilia 08/25/2019   Chronic constipation 06/29/2019   Hyperlipidemia associated with type 2 diabetes mellitus ([place]) 06/10/2019   Essential hypertension 03/02/2018   Hypertensive heart disease with diastolic heart failure ([place]) 01/21/2017   Postoperative hypothyroidism 01/21/2017   History of tobacco use 01/21/2017   Type 2 diabetes mellitus without complication, with long-term current use of insulin ([place]) 01/21/2017    Past Surgical History:  Procedure Laterality Date   ABLATION     uterus   COLONOSCOPY     2017 -  normal: in [place], [place] CATH AND CORONARY ANGIOGRAPHY  05/19/2011   ([facility]): Normal coronary arteries   THYROIDECTOMY     TRANSTHORACIC ECHOCARDIOGRAM  07/18/2011   [facility] [place]: EF 35% with mild to moderate DD. -->   TRANSTHORACIC ECHOCARDIOGRAM     [facility] [place]: EF 55%, G1 DD.  LVIDd 4.3; July 12, 2015: EF 55 to 60%.  Normal valves   UPPER GASTROINTESTINAL ENDOSCOPY  07/06/2019     OB History     Gravida  3   Para      Term      Preterm      AB      Living         SAB      IAB      Ectopic      Multiple      Live Births  2           Family History  Problem Relation Age  of Onset   Congestive Heart Failure Mother    Breast cancer Neg Hx    Colon cancer Neg Hx    Esophageal cancer Neg Hx    Rectal cancer Neg Hx    Stomach cancer Neg Hx    Inflammatory bowel disease Neg Hx    Liver disease Neg Hx    Pancreatic cancer Neg Hx     Social History   Tobacco Use   Smoking status: Former    Pack years: 0.00    Types: Cigars    Quit date: 03/2019    Years since quitting: 1.5   Smokeless tobacco: Never   Tobacco comments:    smokes occasional cigar  Vaping Use   Vaping Use: Never used  Substance Use Topics   Alcohol use: Yes    Comment: social   Drug use: Never    Home Medications Prior to Admission medications   Medication Sig Start Date End Date Taking?  Authorizing Provider  acyclovir (ZOVIRAX) 400 MG tablet Take 1 tablet (400 mg total) by mouth 5 (five) times daily. 06/21/20   [name], MD  atorvastatin (LIPITOR) 40 MG tablet Take 1 tablet (40 mg total) by mouth daily. 06/20/20   [name], MD  Blood Glucose Monitoring Suppl (TRUE METRIX METER) w/Device KIT Use as directed 11/15/19   Ladell Pier, MD  carvedilol (COREG) 25 MG tablet Take 1 tablet (25 mg total) by mouth 2 (two) times daily with a meal. 04/30/20   [name], MD  esomeprazole (NEXIUM) 40 MG capsule Take 1 capsule (40 mg total) by mouth 2 (two) times daily before a meal. 04/02/20   [name], MD  esomeprazole (NEXIUM) 40 MG capsule TAKE 1 CAPSULE (40 MG TOTAL) BY MOUTH DAILY. 02/05/20 02/04/21  Mansouraty, Telford Nab., MD  FARXIGA 10 MG TABS tablet Take 10 mg by mouth every morning. 06/21/20   [provider]  furosemide (LASIX) 20 MG tablet Take 1 tablet (20 mg total) by mouth daily as needed. 06/21/20   [name], MD  gabapentin (NEURONTIN) 300 MG capsule TAKE 2 CAPSULES (600 MG TOTAL) BY MOUTH 2 (TWO) TIMES DAILY. 04/02/20 04/02/21  [name], MD  glucose blood (TRUE METRIX BLOOD GLUCOSE TEST) test strip Use as instructed 11/15/19   Ladell Pier, MD  hyoscyamine (LEVSIN) 0.125 MG tablet TAKE 1 TABLET BY MOUTH EVERY 4 HOURS AS NEEDED 09/24/20   [name], MD  insulin glargine (LANTUS) 100 unit/mL SOPN Inject 20 Units into the skin at bedtime. 04/02/20   [name], MD  losartan (COZAAR) 100 MG tablet Take 1 tablet (100 mg total) by mouth daily. 06/20/20 06/15/21  [name], MD  senna-docusate (SENOKOT-S) 8.6-50 MG tablet Take 1 tablet by mouth daily. 04/02/20   [name], MD  spironolactone (ALDACTONE) 25 MG tablet Take 1 tablet (25 mg total) by mouth daily. 08/07/20   [name], MD  sucralfate (CARAFATE) 1 g tablet TAKE 1 TABLET (1 G TOTAL) BY MOUTH 4 (FOUR) TIMES DAILY - WITH MEALS AND AT  BEDTIME. 07/01/20 07/01/21  [name], MD  SYNTHROID 150 MCG tablet Take 1 tablet (150 mcg total) by mouth daily. 03/18/20   [name], MD  TRUEplus Lancets 28G MISC Use as directed 11/15/19   Ladell Pier, MD    Allergies    Lisinopril, Metformin and related, and Oxycodone  Review of Systems   Review of Systems  Constitutional:  Negative for fever.  HENT:  Negative for trouble swallowing.   Musculoskeletal:        Neck swelling   Physical Exam Updated Vital Signs BP (!) 183/104   Pulse 80   Temp 98.1 F (36.7 C) (Oral)   Resp 20   Ht 5' 3" (1.6 m)   Wt 72.6 kg   SpO2 98%   BMI 28.34 kg/m   Physical Exam Vitals and nursing note reviewed.  Constitutional:      General: She is not in acute distress.    Appearance: She is well-developed.  HENT:     Head: Normocephalic and atraumatic.     Mouth/Throat:     Comments: Submandibular salivary gland is swollen and there appears to be a white punctate stone in the distal salivary duct. Eyes:     Conjunctiva/sclera: Conjunctivae normal.  Cardiovascular:     Rate and Rhythm: Normal rate.  Pulmonary:     Effort: Pulmonary effort is normal.  Musculoskeletal:        General: Normal range of motion.     Cervical back: Neck supple.  Skin:    General: Skin is warm and dry.  Neurological:     Mental Status: She is alert.    ED Results / Procedures / Treatments   Labs (all labs ordered are listed, but only abnormal results are displayed) Labs Reviewed - No data to display  EKG None  Radiology No results found.  Procedures Procedures   Medications Ordered in ED Medications - No data to display  ED Course  I have reviewed the triage vital signs and the nursing notes.  Pertinent labs & imaging results that were available during my care of the patient were reviewed by me and considered in my medical decision making (see chart for details).    MDM Rules/Calculators/A&P                           55 year old female presenting for evaluation of neck swelling that started 30 minutes prior to arrival.  On exam, this consistent with silo lithiasis.  Stone was pushed out of the duct by myself and patient had normally draining sinus following this.  She had decrease in the swelling of her neck and improvement of symptoms.  She is advised to continue using sialagogues at home and we will give her an outpatient referral to ENT should she have recurrence of symptoms.  She voices understanding the plan and reasons to return.  All questions answered.  Patient stable for discharge.  Final Clinical Impression(s) / ED Diagnoses Final diagnoses:  Sialolithiasis    Rx / DC Orders ED Discharge Orders     None        Bishop Dublin 10/03/20 1949    Charlesetta Shanks, MD 10/08/20 2114

## 2020-10-03 NOTE — Discharge Instructions (Addendum)
Please continue to eat sour foods to promote saliva production  You are given a referral to the ear nose and throat doctor should your symptoms recur.  Please return to the emergency department for any new or worsening symptoms.

## 2020-10-04 ENCOUNTER — Ambulatory Visit: Payer: BC Managed Care – PPO | Admitting: Gastroenterology

## 2020-10-10 ENCOUNTER — Other Ambulatory Visit: Payer: Self-pay

## 2020-10-10 ENCOUNTER — Ambulatory Visit (INDEPENDENT_AMBULATORY_CARE_PROVIDER_SITE_OTHER): Payer: Self-pay | Admitting: Obstetrics and Gynecology

## 2020-10-10 ENCOUNTER — Encounter: Payer: Self-pay | Admitting: Obstetrics and Gynecology

## 2020-10-10 VITALS — BP 153/96 | HR 67 | Ht 63.0 in | Wt 161.5 lb

## 2020-10-10 DIAGNOSIS — D219 Benign neoplasm of connective and other soft tissue, unspecified: Secondary | ICD-10-CM

## 2020-10-10 DIAGNOSIS — R350 Frequency of micturition: Secondary | ICD-10-CM | POA: Insufficient documentation

## 2020-10-10 NOTE — Patient Instructions (Signed)
Abdominal Hysterectomy, Care After The following information offers guidance on how to care for yourself after your procedure. Your doctor may also give you more specific instructions. Ifyou have problems or questions, contact your doctor. What can I expect after the procedure? After the procedure, it is common to have: Pain. Tiredness. No desire to eat. Less interest in sex. Bleeding and fluid (discharge) from your vagina. You may need to use a pad after this procedure. Trouble pooping (constipation). Feelings of sadness or other emotions. Follow these instructions at home: Medicines Take over-the-counter and prescription medicines only as told by your doctor. Do not take aspirin or NSAIDs, such as ibuprofen. These medicines can cause bleeding. If you were prescribed an antibiotic medicine, take it as told by your doctor. Do not stop using the antibiotic even if you start to feel better. If told, take steps to prevent problems with pooping (constipation). You may need to: Drink enough fluid to keep your pee (urine) pale yellow. Take medicines. You will be told what medicines to take. Eat foods that are high in fiber. These include beans, whole grains, and fresh fruits and vegetables. Limit foods that are high in fat and sugar. These include fried or sweet foods. Ask your doctor if you should avoid driving or using machines while you are taking your medicine. Surgical cut care  Follow instructions from your doctor about how to take care of your cut from surgery (incision). Make sure you: Wash your hands with soap and water for at least 20 seconds before and after you change your bandage. If you cannot use soap and water, use hand sanitizer. Change your bandage. Leave stitches or skin glue in place for at least two weeks. Leave tape strips alone unless you are told to take them off. You may trim the edges of the tape strips if they curl up. Keep the bandage dry until your doctor says it  can be taken off. Check your incision every day for signs of infection. Check for: More redness, swelling, or pain. Fluid or blood. Warmth. Pus or a bad smell.  Activity  Rest as told by your doctor. Get up to take short walks every 1 to 2 hours. Ask for help if you feel weak or unsteady. Do not lift anything that is heavier than 10 lb (4.5 kg), or the limit that you are told. Follow your doctor's advice about exercise, driving, and general activities. Return to your normal activities when your doctor says that it is safe.  Lifestyle Do not douche, use tampons, or have sex for at least 6 weeks or as told by your doctor. Do not drink alcohol until your doctor says it is okay. Do not smoke or use any products that contain nicotine or tobacco. These can delay healing after surgery. If you need help quitting, ask your doctor. General instructions Do not take baths, swim, or use a hot tub. Ask your doctor about taking showers or sponge baths. Try to have a responsible adult at home with you for the first 1-2 weeks to help with your daily chores. Wear tight-fitting (compression) stockings as told by your doctor. Keep all follow-up visits. Contact a doctor if: You have chills or a fever. You have any of these signs of infection around your cut: More redness, swelling, or pain. Fluid or blood. Warmth. Pus or a bad smell. Your cut breaks open. You feel dizzy or light-headed. You have pain or bleeding when you pee. You keep having watery poop (diarrhea).  You keep feeling like you may vomit or you keep vomiting. You have fluid coming from your vagina that is not normal. You have any type of reaction to your medicine that is not normal, like a rash, or you develop an allergy to your medicine. Your pain medicine does not help. Get help right away if: You have a fever and your symptoms get worse suddenly. You have very bad pain in your belly (abdomen). You are short of breath. You  faint. You have pain, swelling, or redness of your leg. You bleed a lot from your vagina and you see blood clots. Summary It is normal to have some pain, tiredness, and fluid that comes from your vagina. Do not take baths, swim, or use a hot tub. Ask your doctor about taking showers or sponge baths. Do not lift anything that is heavier than 10 lb (4.5 kg), or the limit that you are told. Follow your doctor's advice about exercise, driving, and general activities. Try to have a responsible adult at home with you for the first 1-2 weeks to help with your daily chores. This information is not intended to replace advice given to you by your health care provider. Make sure you discuss any questions you have with your healthcare provider. Document Revised: 12/14/2019 Document Reviewed: 12/14/2019 Elsevier Patient Education  2022 [employer].

## 2020-10-10 NOTE — Progress Notes (Signed)
Suzanne Cline presents to reschedule her surgery. Last seen in Jan Medical clearance obtained She reports some urinary freq and still pelvic discomfort due to uterine fibroids  PE AF VSS Lungs clear Heart RRR Abd soft, + BS, uterus 18-20 weeks  A/P Uterine fibroids  TAH with possible salpingectomy reviewed with pt. R/B/Post op care discussed. Information provided. Hysterectomy papers signed today. Will check UC. F/U with post op appt

## 2020-10-13 LAB — URINE CULTURE

## 2020-10-14 ENCOUNTER — Telehealth: Payer: Self-pay | Admitting: *Deleted

## 2020-10-14 ENCOUNTER — Encounter: Payer: Self-pay | Admitting: *Deleted

## 2020-10-14 NOTE — Telephone Encounter (Signed)
Call to patient. Advised surgery scheduled for Tuesday, 11-19-20 at 1130- arrive 0930 at [facility]. Advised will receive letter in mail and pre-op instruction call.  Encounter closed.

## 2020-10-15 ENCOUNTER — Encounter: Payer: Self-pay | Admitting: *Deleted

## 2020-11-08 ENCOUNTER — Other Ambulatory Visit: Payer: Self-pay | Admitting: Critical Care Medicine

## 2020-11-14 NOTE — Progress Notes (Signed)
Surgical Instructions    Your procedure is scheduled on 11/19/20.  Report to [facility] Main Entrance "A" at 09:30 A.M., then check in with the Admitting office.  Call this number if you have problems the morning of surgery:  [phone]   If you have any questions prior to your surgery date call [phone]: Open Monday-Friday 8am-4pm    Remember:  Do not eat after midnight the night before your surgery  You may drink clear liquids until 08:30am the morning of your surgery.   Clear liquids allowed are: Water, Non-Citrus Juices (without pulp), Carbonated Beverages, Clear Tea, Black Coffee Only, and Gatorade    Take these medicines the morning of surgery with A SIP OF WATER  acyclovir (ZOVIRAX) atorvastatin (LIPITOR)  carvedilol (COREG) esomeprazole (NEXIUM)  gabapentin (NEURONTIN) hyoscyamine (LEVSIN) if needed SYNTHROID   As of today, STOP taking any Aspirin (unless otherwise instructed by your surgeon) Aleve, Naproxen, Ibuprofen, Motrin, Advil, Goody's, BC's, all herbal medications, fish oil, and all vitamins.  WHAT DO I DO ABOUT MY DIABETES MEDICATION?   Do not take oral diabetes medicines (pills) the morning of surgery.  THE DAY BEFORE SURGERY do not take [place]. Take 50% of your nightly dose of insulin glargine (LANTUS).     THE MORNING OF SURGERY, do not take [place].  The day of surgery, do not take other diabetes injectables, including Byetta (exenatide), Bydureon (exenatide ER), Victoza (liraglutide), or Trulicity (dulaglutide).  If your CBG is greater than 220 mg/dL, you may take  of your sliding scale (correction) dose of insulin.   HOW TO MANAGE YOUR DIABETES BEFORE AND AFTER SURGERY  Why is it important to control my blood sugar before and after surgery? Improving blood sugar levels before and after surgery helps healing and can limit problems. A way of improving blood sugar control is eating a healthy diet by:  Eating less sugar and carbohydrates   Increasing activity/exercise  Talking with your doctor about reaching your blood sugar goals High blood sugars (greater than 180 mg/dL) can raise your risk of infections and slow your recovery, so you will need to focus on controlling your diabetes during the weeks before surgery. Make sure that the doctor who takes care of your diabetes knows about your planned surgery including the date and location.  How do I manage my blood sugar before surgery? Check your blood sugar at least 4 times a day, starting 2 days before surgery, to make sure that the level is not too high or low.  Check your blood sugar the morning of your surgery when you wake up and every 2 hours until you get to the Short Stay unit.  If your blood sugar is less than 70 mg/dL, you will need to treat for low blood sugar: Do not take insulin. Treat a low blood sugar (less than 70 mg/dL) with  cup of clear juice (cranberry or apple), 4 glucose tablets, OR glucose gel. Recheck blood sugar in 15 minutes after treatment (to make sure it is greater than 70 mg/dL). If your blood sugar is not greater than 70 mg/dL on recheck, call [phone] for further instructions. Report your blood sugar to the short stay nurse when you get to Short Stay.  If you are admitted to the hospital after surgery: Your blood sugar will be checked by the staff and you will probably be given insulin after surgery (instead of oral diabetes medicines) to make sure you have good blood sugar levels. The goal for blood sugar control  after surgery is 80-180 mg/dL.           Do not wear jewelry or makeup Do not wear lotions, powders, perfumes, or deodorant. Do not shave 48 hours prior to surgery.   Do not bring valuables to the hospital.  DO Not wear nail polish, gel polish, artificial nails, or any other type of covering on natural nails  including finger and toenails. If patients have artificial nails, gel coating, etc. that need to be removed by a nail  salon please have this removed prior to surgery or surgery may need to be canceled/delayed if the surgeon/ anesthesia feels like the patient is unable to be adequately monitored.             [facility] is not responsible for any belongings or valuables.  Do NOT Smoke (Tobacco/Vaping) or drink Alcohol 24 hours prior to your procedure If you use a CPAP at night, you may bring all equipment for your overnight stay.   Contacts, glasses, dentures or bridgework may not be worn into surgery, please bring cases for these belongings   For patients admitted to the hospital, discharge time will be determined by your treatment team.   Patients discharged the day of surgery will not be allowed to drive home, and someone needs to stay with them for 24 hours.  ONLY 1 SUPPORT PERSON MAY BE PRESENT WHILE YOU ARE IN SURGERY. IF YOU ARE TO BE ADMITTED ONCE YOU ARE IN YOUR ROOM YOU WILL BE ALLOWED TWO (2) VISITORS.  Minor children may have two parents present. Special consideration for safety and communication needs will be reviewed on a case by case basis.  Special instructions:    Oral Hygiene is also important to reduce your risk of infection.  Remember - BRUSH YOUR TEETH THE MORNING OF SURGERY WITH YOUR REGULAR TOOTHPASTE   [facility]- Preparing For Surgery  Before surgery, you can play an important role. Because skin is not sterile, your skin needs to be as free of germs as possible. You can reduce the number of germs on your skin by washing with CHG (chlorahexidine gluconate) Soap before surgery.  CHG is an antiseptic cleaner which kills germs and bonds with the skin to continue killing germs even after washing.     Please do not use if you have an allergy to CHG or antibacterial soaps. If your skin becomes reddened/irritated stop using the CHG.  Do not shave (including legs and underarms) for at least 48 hours prior to first CHG shower. It is OK to shave your face.  Please follow these instructions  carefully.     Shower the NIGHT BEFORE SURGERY and the MORNING OF SURGERY with CHG Soap.   If you chose to wash your hair, wash your hair first as usual with your normal shampoo. After you shampoo, rinse your hair and body thoroughly to remove the shampoo.  Then [employer] and genitals (private parts) with your normal soap and rinse thoroughly to remove soap.  After that Use CHG Soap as you would any other liquid soap. You can apply CHG directly to the skin and wash gently with a scrungie or a clean washcloth.   Apply the CHG Soap to your body ONLY FROM THE NECK DOWN.  Do not use on open wounds or open sores. Avoid contact with your eyes, ears, mouth and genitals (private parts). Wash Face and genitals (private parts)  with your normal soap.   Wash thoroughly, paying special attention to the  area where your surgery will be performed.  Thoroughly rinse your body with warm water from the neck down.  DO NOT shower/wash with your normal soap after using and rinsing off the CHG Soap.  Pat yourself dry with a CLEAN TOWEL.  Wear CLEAN PAJAMAS to bed the night before surgery  Place CLEAN SHEETS on your bed the night before your surgery  DO NOT SLEEP WITH PETS.   Day of Surgery: Take a shower with CHG soap. Wear Clean/Comfortable clothing the morning of surgery Do not apply any deodorants/lotions.   Remember to brush your teeth WITH YOUR REGULAR TOOTHPASTE.   Please read over the following fact sheets that you were given.

## 2020-11-15 ENCOUNTER — Encounter (HOSPITAL_COMMUNITY)
Admission: RE | Admit: 2020-11-15 | Discharge: 2020-11-15 | Disposition: A | Discharge status: Home / Self Care | Payer: Self-pay | Source: Ambulatory Visit | Attending: Obstetrics and Gynecology | Admitting: Obstetrics and Gynecology

## 2020-11-15 ENCOUNTER — Encounter (HOSPITAL_COMMUNITY): Payer: Self-pay | Admitting: Certified Registered"

## 2020-11-15 ENCOUNTER — Other Ambulatory Visit: Payer: Self-pay

## 2020-11-15 ENCOUNTER — Encounter (HOSPITAL_COMMUNITY): Payer: Self-pay | Admitting: Physician Assistant

## 2020-11-15 DIAGNOSIS — Z20822 Contact with and (suspected) exposure to covid-19: Secondary | ICD-10-CM | POA: Insufficient documentation

## 2020-11-15 DIAGNOSIS — Z01812 Encounter for preprocedural laboratory examination: Secondary | ICD-10-CM | POA: Insufficient documentation

## 2020-11-15 LAB — BASIC METABOLIC PANEL
Anion gap: 10 (ref 5–15)
BUN: 17 mg/dL (ref 6–20)
CO2: 26 mmol/L (ref 22–32)
Calcium: 9.2 mg/dL (ref 8.9–10.3)
Chloride: 101 mmol/L (ref 98–111)
Creatinine, Ser: 1.1 mg/dL — ABNORMAL HIGH (ref 0.44–1.00)
GFR, Estimated: 59 mL/min — ABNORMAL LOW (ref >60)
Glucose, Bld: 240 mg/dL — ABNORMAL HIGH (ref 70–99)
Potassium: 4 mmol/L (ref 3.5–5.1)
Sodium: 137 mmol/L (ref 135–145)

## 2020-11-15 LAB — CBC
HCT: 42.3 % (ref 36.0–46.0)
Hemoglobin: 13.7 g/dL (ref 12.0–15.0)
MCH: 28.5 pg (ref 26.0–34.0)
MCHC: 32.4 g/dL (ref 30.0–36.0)
MCV: 88.1 fL (ref 80.0–100.0)
Platelets: 176 10*3/uL (ref 150–400)
RBC: 4.8 MIL/uL (ref 3.87–5.11)
RDW: 13.1 % (ref 11.5–15.5)
WBC: 8.5 10*3/uL (ref 4.0–10.5)
nRBC: 0 % (ref 0.0–0.2)

## 2020-11-15 LAB — TYPE AND SCREEN
ABO/RH(D): A POS
Antibody Screen: NEGATIVE

## 2020-11-15 LAB — GLUCOSE, CAPILLARY: Glucose-Capillary: 248 mg/dL — ABNORMAL HIGH (ref 70–99)

## 2020-11-15 LAB — SARS CORONAVIRUS 2 (TAT 6-24 HRS): SARS Coronavirus 2: NEGATIVE

## 2020-11-15 NOTE — Progress Notes (Signed)
Patient was very difficult and argumentative.  Refused to review meds with pharm tech or answer my questions.  Jovita Kussmaul, RN and Karoline Caldwell, [place] were involved.  Only reviewed instructions and obtained labs.

## 2020-11-15 NOTE — Progress Notes (Signed)
PCP: Asencion Noble, MD Cardiologist: Glenetta Hew, MD  EKG: 11/15/20 CXR: na ECHO: 10/05/19 Stress Test: denies Cardiac Cath: denies  Fasting Blood Sugar- does not check Checks Blood Sugar__0_ times a day  OSA/CPAP: Did not review history with patient.  She refused to answer questions.    ASA/Blood Thinner: No  Covid test 11/15/20 at PAT  Anesthesia Review: yes, cardiac history.  Karoline Caldwell, PA spoke with patient during PAT visit.  Abnormal EKG  Patient denies shortness of breath, fever, cough, and chest pain at PAT appointment.  Patient verbalized understanding of instructions provided today at the PAT appointment.  Patient asked to review instructions at home and day of surgery.

## 2020-11-15 NOTE — Progress Notes (Signed)
Anesthesia Chart Review:  Patient previously followed by cardiology for history of nonischemic cardiomyopathy in 2013 (with resolution by follow-up echo in 2017), hypertensive heart disease.  In May 2021 patient was referred back to Dr. Ellyn Hack by her PCP for preop evaluation (evaluation was for the same surgery she is having now, surgery has been delayed several times).  Per note, "As for preop evaluation, if a lot of this depends on her echocardiogram.  She has no active anginal symptoms and no true heart failure symptoms. She has diabetes but not on insulin.  Uterine fibroid resection would only be high risk if intra-abdominal surgery performed.  Regardless, she is on max dose carvedilol and I would not recommend any further cardiac valuation beyond 2D echo. Provided the echo does not show any significant reduced EF or wall motion changes, would not require any further evaluation or treatment."  Echo 10/05/2019 showed normal LVEF 66 5%, mild increased wall thickness with impaired relaxation related to high blood pressure.  It also showed moderate to severe mitral regurgitation, which was new.  Dr. Ellyn Hack commented and stated that she would probably need recheck cardiac echocardiograms every so often.  Patient reports that this is now being followed by her primary care physician Dr. Asencion Noble.  She was seen by Dr. Joya Gaskins April 30, 2020 and at that time was cleared for surgery from medical and cardiac standpoint.  Per note, "Hypertensive heart disease with diastolic heart failure stable at this time allocations and patient can be cleared from a cardiac and medical standpoint for planned gynecology surgery."  Spoke with patient at her PAT appointment to assess functional status.  She states she can climb 2 flights stairs without undue shortness of breath or chest pain.  She says her functional status has been stable, no recent changes.  She denies any shortness of breath or chest pain.  She denies any  presyncopal or syncopal events.  She denies any palpitations.  Primary complaint is significant chronic pain related to uterine fibroids.  Hx of OSA on CPAP.  Markedly uncontrolled IDDM2, A1c 12.4 on preop labs. Dr. Rip Harbour notified.   EKG 11/15/20: Sinus rhythm with Premature atrial complexes. Rate 85. Possible Left atrial enlargement. Left anterior fascicular block. Left ventricular hypertrophy ( R in aVL , Cornell product ). Cannot rule out Septal infarct , age undetermined. No significant change since 2020  TTE 10/05/19:  1. Left ventricular ejection fraction, by estimation, is 60 to 65%. The  left ventricle has normal function. The left ventricle has no regional  wall motion abnormalities. There is mild left ventricular hypertrophy.  Left ventricular diastolic parameters  are consistent with Grade I diastolic dysfunction (impaired relaxation).   2. Right ventricular systolic function is normal. The right ventricular  size is normal. There is normal pulmonary artery systolic pressure.   3. The mitral valve is grossly normal. Moderate to severe mitral valve  regurgitation. No evidence of mitral stenosis.   4. The aortic valve is normal in structure. Aortic valve regurgitation is  not visualized. No aortic stenosis is present.   Wynonia Musty [facility] Short Stay Center/Anesthesiology Phone [phone] 11/18/2020 12:02 PM

## 2020-11-16 LAB — HEMOGLOBIN A1C
Hgb A1c MFr Bld: 12.4 % — ABNORMAL HIGH (ref 4.8–5.6)
Mean Plasma Glucose: 309 mg/dL

## 2020-11-16 NOTE — H&P (Signed)
Suzanne Cline is an 55 y.o. female with known uterine fibroids. Pt desires definite therapy. GYN U/S confirmed, largest 11 x 11 x 9 cm.  H/O HTN and DM2.  Has received pre op clearance by her PCP.  Pt desires definite therapy. TAH/BS has been reviewed with pt.    Menstrual History: Menarche age: 48 No LMP recorded. Patient is postmenopausal.    Past Medical History:  Diagnosis Date   CHF (congestive heart failure) ([place])    Diabetes mellitus without complication ([place])    Fibroids, intramural    s/p uterine artery embolization   Former cigarette smoker 04/2011   History of Resolved Nonischemic Congestive Cardiomyopathy ([place]) 04/2011   Echo March 2013: EF 35% with mild/moderate diastolic dysfunction -> Follow-Up Echo March 2014 EF 55% with G1 DD.   Hypertension    Hypertensive heart disease    Difficult control hypertension   Mild concentric left ventricular hypertrophy (LVH) 10/08/2019   Moderate mitral valve regurgitation 10/08/2019   Echo done June 2021; Dr. Ellyn Hack   Neuromuscular disorder [facility])    diabetic neuropathy   OSA (obstructive sleep apnea)    Sleep apnea    doesn't use cpap regularly   Thyroid disease    post thyroidectomy    Past Surgical History:  Procedure Laterality Date   ABLATION     uterus   COLONOSCOPY     2017 -  normal: in [place], [place] CATH AND CORONARY ANGIOGRAPHY  05/19/2011   ([facility]): Normal coronary arteries   THYROIDECTOMY     TRANSTHORACIC ECHOCARDIOGRAM  07/18/2011   [facility] [place]: EF 35% with mild to moderate DD. -->   TRANSTHORACIC ECHOCARDIOGRAM     [facility] [place]: EF 55%, G1 DD.  LVIDd 4.3; July 12, 2015: EF 55 to 60%.  Normal valves   UPPER GASTROINTESTINAL ENDOSCOPY  07/06/2019    Family History  Problem Relation Age of Onset   Congestive Heart Failure Mother    Breast cancer Neg Hx    Colon cancer Neg Hx    Esophageal cancer Neg Hx    Rectal cancer Neg Hx     Stomach cancer Neg Hx    Inflammatory bowel disease Neg Hx    Liver disease Neg Hx    Pancreatic cancer Neg Hx     Social History:  reports that she quit smoking about 19 months ago. Her smoking use included cigars. She has never used smokeless tobacco. She reports current alcohol use. She reports that she does not use drugs.  Allergies:  Allergies  Allergen Reactions   Lisinopril Cough   Oxycodone Itching    No medications prior to admission.    Review of Systems  Constitutional: Negative.   Respiratory: Negative.    Cardiovascular: Negative.   Gastrointestinal: Negative.   Genitourinary: Negative.    There were no vitals taken for this visit. Physical Exam Constitutional:      Appearance: Normal appearance.  Cardiovascular:     Rate and Rhythm: Normal rate and regular rhythm.  Pulmonary:     Effort: Pulmonary effort is normal.     Breath sounds: Normal breath sounds.  Abdominal:     General: Bowel sounds are normal.     Palpations: Abdomen is soft.     Comments: Abd/pelvic mass affect to umbilicus  Genitourinary:    Comments: Nl EGBUS, enlarged uterus with irregular contour, adnexal limited by uterine size Neurological:     Mental Status: She is alert.  No results found for this or any previous visit (from the past 24 hour(s)).  No results found.  Assessment/Plan: Uterine Fibroids  TAH/BS has been reviewed with pt. R/B/Post op care discussed with pt. Pt has verbalized understanding and desires to proceed.  [name] 11/16/2020, 2:34 PM

## 2020-11-18 NOTE — Anesthesia Preprocedure Evaluation (Deleted)
Anesthesia Evaluation    Airway        Dental   Pulmonary former smoker,           Cardiovascular hypertension,      Neuro/Psych    GI/Hepatic   Endo/Other  diabetes  Renal/GU      Musculoskeletal   Abdominal   Peds  Hematology   Anesthesia Other Findings   Reproductive/Obstetrics                             Anesthesia Physical Anesthesia Plan  ASA:   Anesthesia Plan:    Post-op Pain Management:    Induction:   PONV Risk Score and Plan:   Airway Management Planned:   Additional Equipment:   Intra-op Plan:   Post-operative Plan:   Informed Consent:   Plan Discussed with:   Anesthesia Plan Comments: (PAT note by Karoline Caldwell, PA-C: Patient previously followed by cardiology for history of nonischemic cardiomyopathy in 2013 (with resolution by follow-up echo in 2017), hypertensive heart disease.  In May 2021 patient was referred back to Dr. Ellyn Hack by her PCP for preop evaluation (evaluation was for the same surgery she is having now, surgery has been delayed several times).  Per note, "As for preop evaluation, if a lot of this depends on her echocardiogram.  She has no active anginal symptoms and no true heart failure symptoms. She has diabetes but not on insulin.  Uterine fibroid resection would only be high risk if intra-abdominal surgery performed.  Regardless, she is on max dose carvedilol and I would not recommend any further cardiac valuation beyond 2D echo. Provided the echo does not show any significant reduced EF or wall motion changes, would not require any further evaluation or treatment."  Echo 10/05/2019 showed normal LVEF 66 5%, mild increased wall thickness with impaired relaxation related to high blood pressure.  It also showed moderate to severe mitral regurgitation, which was new.  Dr. Ellyn Hack commented and stated that she would probably need recheck cardiac echocardiograms  every so often.  Patient reports that this is now being followed by her primary care physician Dr. Asencion Noble.  She was seen by Dr. Joya Gaskins April 30, 2020 and at that time was cleared for surgery from medical and cardiac standpoint.  Per note, "Hypertensive heart disease with diastolic heart failure stable at this time allocations and patient can be cleared from a cardiac and medical standpoint for planned gynecology surgery."  Spoke with patient at her PAT appointment to assess functional status.  She states she can climb 2 flights stairs without undue shortness of breath or chest pain.  She says her functional status has been stable, no recent changes.  She denies any shortness of breath or chest pain.  She denies any presyncopal or syncopal events.  She denies any palpitations.  Primary complaint is significant chronic pain related to uterine fibroids.  Hx of OSA on CPAP.  Markedly uncontrolled IDDM2, A1c 12.4 on preop labs. Dr. Rip Harbour notified.   EKG 11/15/20: Sinus rhythm with Premature atrial complexes. Rate 85. Possible Left atrial enlargement. Left anterior fascicular block. Left ventricular hypertrophy ( R in aVL , Cornell product ). Cannot rule out Septal infarct , age undetermined. No significant change since 2020  TTE 10/05/19: 1. Left ventricular ejection fraction, by estimation, is 60 to 65%. The  left ventricle has normal function. The left ventricle has no regional  wall motion abnormalities. There is mild left  ventricular hypertrophy.  Left ventricular diastolic parameters  are consistent with Grade I diastolic dysfunction (impaired relaxation).  2. Right ventricular systolic function is normal. The right ventricular  size is normal. There is normal pulmonary artery systolic pressure.  3. The mitral valve is grossly normal. Moderate to severe mitral valve  regurgitation. No evidence of mitral stenosis.  4. The aortic valve is normal in structure. Aortic valve regurgitation  is  not visualized. No aortic stenosis is present. )        Anesthesia Quick Evaluation

## 2020-11-19 ENCOUNTER — Inpatient Hospital Stay (HOSPITAL_COMMUNITY): Admission: RE | Admit: 2020-11-19 | Payer: Self-pay | Source: Home / Self Care | Admitting: Obstetrics and Gynecology

## 2020-11-19 ENCOUNTER — Encounter (HOSPITAL_COMMUNITY): Admission: RE | Payer: Self-pay | Source: Home / Self Care

## 2020-11-19 SURGERY — HYSTERECTOMY, TOTAL, ABDOMINAL, WITH SALPINGECTOMY
Anesthesia: Choice | Laterality: Bilateral

## 2020-11-24 NOTE — Progress Notes (Signed)
 Subjective:    Patient ID: Suzanne Cline, female    DOB: 09/20/1965, 55 y.o.   MRN: [id number]  55 y.o.F PCP to est former pt of dr Fulp Hx of HFpEF, HTN, MVR, T2DM, HLD, tobacco,GERD with reflux esophagitis without bleeding on 09/2019 EGD with [facility]  This patient is seen in follow-up and to establish primary care.  The patient formally was a patient of Dr. Fulp who is no longer in the practice and the patient is here now for the first visit with me.  Patient history dates back and that she has hypertension, heart failure with preserved ejection fraction EF 65% with thickened left ventricle and diastolic heart failure, mitral regurgitation, type 2 diabetes, hyperlipidemia, tobacco use, reflux disease with recent upper endoscopy biopsy showing reflux esophagitis only   Patient has longstanding issues of upper abdominal pain left upper quadrant and epigastric pain she has constipation with this as well it feels like a tightness in the epigastric area and radiates into the back.  She has been taking once daily Nexium without much improvement in the symptoms.  She has 1 bowel movement every 3 to 4 days.  She does vape CBD oil but is no longer smoking cigarettes.  Patient is on thyroid supplementation at this time.  Note on arrival hemoglobin A1c was 6.1.  She is on the Lantus 16 units daily and also glipizide twice daily.  Blood sugar on arrival today was 78.  She notes she misses meals on several occasions.  Note this patient needs a mammogram hepatitis C and HIV screening.  She also needs colon cancer screening.  Note this patient is self-pay.  This patient also has a history of yet to receive Covid vaccine and is also refusing flu vaccine at this visit.  Patient still has a lot of misinformation and fears about the Covid vaccine that she states.  Note the patient does not check her blood sugar on a regular basis.  She will miss many meals.  The patient does get refills from [facility]  program and is requesting many of her chronic medications be sent to that location with 90-day refills   04/30/20 This patient is seen in short-term follow-up and is still having abdominal pain now radiating down to the pelvic area with incontinence.  She is now having bowel movements at least more often 1-2 times daily instead of every 3 to 4 days.  Despite being on high-dose Nexium the abdominal pain persists.  She states it is a pulling tightness sensation a sensation of fullness and pushing upwards towards the chest from the lower abdomen.  She does have a large uterine fibroid that is needed to be removed with hysterectomy.  She was awaiting clearance from her other physicians.  I pick this patient up in December and now that I have a better understanding of her cardiac status and the fact that her diabetes is well controlled with hemoglobin A1c down to 6.1 we can expedite referral back to gynecology for consideration of hysterectomy.  The patient on arrival has a blood pressure 132/87 which is in good range.  The patient maintains Synthroid for postoperative hypothyroidism.  She is maintaining Lantus 20 units daily and Jardiance 10 mg daily blood sugars have been in the 110 range she is now off the glipizide.   We discussed again during the interview the importance of a Covid vaccine she is now willing to proceed and request a Pfizer vaccine.  The patient also is   yet to turn in all get as it was not processed and instead was mailed in and somehow lost so a new kit will have to be issued  07/01/20 This patient is seen in return follow-up and on arrival is quite distressed initial blood pressure was 163/97 and when was down to 145/67.  Patient states that she has had a job change and she is now underinsured and cannot afford some of the medication.  She did get the [place] but says she prefers the [place].  She complains of abdominal pain that is in the epigastric and lower abdominal areas.  She has  history of gastric antritis and also history of esophagitis.  In addition to this the patient has history of uterine fibroids which causes lower abdominal pain.  She states after eating she will get diarrhea and also have abdominal swelling.  She states when she was on Carafate in the past this improves her symptoms.  She is not been taking Nexium on a regular basis.  On arrival CBG was 158 and A1c was 6.9.  Her insurance would not cover Jardiance she is now on for [place] along with the Lantus and she has been taking both daily however there was a covrage gap   For blood pressure the patient states she has been compliant with losartan, Coreg, spironolactone.  Also has been compliant with atorvastatin and Synthroid.  Also taking gabapentin for neuropathy.  She states the Levsin has helped her abdominal pain as well.  The patient is yet to obtain a Covid booster she states she will receive that this week  8/1  Patient was scheduled for an elective hysterectomy due to uterine fibroids but the surgery was cancelled due to uncontrolled diabetes. Her employer recently changed health insurance to a plan that does not cover her medications. She has had a difficult time paying for her medications and reports only being able to take 25 units of Lantus every day. She also recently resumed metformin 1000 mg BID and says it causes abdomina pain and diarrhea. For the past three weeks she endorses blurry vision, polydipsia and polyuria. She denies neuropathy. For breakfast she commonly eats fruit, Atkins shakes or home made smoothies. For protein she will eat chicken or shrimp. Her exercise regimen is limited to small walks as her abdominal pain has restricted her. Her most recent A1C in July 2022 was 12.4  She reports her abdominal pain is unchanged and not improved with omeprazole or hyoscyamine. She often complains of bloating and feeling "inflamed" with nausea and occasional vomiting. She has a history of  constipation but has had more diarrhea this week since resuming metformin.   She is able to maintain her blood pressure and thyroid replacement medications. BP is elevated today at 181/117 and she complains of abdominal pain and bloating. Denies chest pains, SOB or palpitations.  Patient declined to receive any vaccines at this visit, she states she will pursue Covid vaccine first and then get her Tdap later  Past Medical History:  Diagnosis Date   CHF (congestive heart failure) ([place])    Diabetes mellitus without complication ([place])    Fibroids, intramural    s/p uterine artery embolization   Former cigarette smoker 04/2011   History of Resolved Nonischemic Congestive Cardiomyopathy ([place]) 04/2011   Echo March 2013: EF 35% with mild/moderate diastolic dysfunction -> Follow-Up Echo March 2014 EF 55% with G1 DD.   Hypertension    Hypertensive heart disease    Difficult control hypertension  Mild concentric left ventricular hypertrophy (LVH) 10/08/2019   Moderate mitral valve regurgitation 10/08/2019   Echo done June 2021; Dr. Ellyn Hack   Neuromuscular disorder [facility])    diabetic neuropathy   OSA (obstructive sleep apnea)    Sleep apnea    doesn't use cpap regularly   Thyroid disease    post thyroidectomy     Family History  Problem Relation Age of Onset   Congestive Heart Failure Mother    Breast cancer Neg Hx    Colon cancer Neg Hx    Esophageal cancer Neg Hx    Rectal cancer Neg Hx    Stomach cancer Neg Hx    Inflammatory bowel disease Neg Hx    Liver disease Neg Hx    Pancreatic cancer Neg Hx      Social History   Socioeconomic History   Marital status: Single    Spouse name: Not on file   Number of children: Not on file   Years of education: Not on file   Highest education level: Not on file  Occupational History   Not on file  Tobacco Use   Smoking status: Former    Types: Cigars    Quit date: 03/2019    Years since quitting: 1.6   Smokeless tobacco: Never    Tobacco comments:    smokes occasional cigar  Vaping Use   Vaping Use: Never used  Substance and Sexual Activity   Alcohol use: Yes    Comment: social   Drug use: Never   Sexual activity: Not Currently  Other Topics Concern   Not on file  Social History Narrative   Not on file   Social Determinants of Health   Financial Resource Strain: Not on file  Food Insecurity: Not on file  Transportation Needs: Not on file  Physical Activity: Not on file  Stress: Not on file  Social Connections: Not on file  Intimate Partner Violence: Not on file     Allergies  Allergen Reactions   Lisinopril Cough   Oxycodone Itching     Outpatient Medications Prior to Visit  Medication Sig Dispense Refill   acyclovir (ZOVIRAX) 400 MG tablet Take 1 tablet (400 mg total) by mouth 5 (five) times daily. (Patient not taking: Reported on 11/15/2020) 60 tablet 1   Blood Glucose Monitoring Suppl (TRUE METRIX METER) w/Device KIT Use as directed 1 kit 0   glucose blood (TRUE METRIX BLOOD GLUCOSE TEST) test strip Use as instructed 100 each 12   hyoscyamine (LEVSIN) 0.125 MG tablet TAKE 1 TABLET BY MOUTH EVERY 4 HOURS AS NEEDED (Patient taking differently: Take 0.125 mg by mouth every 4 (four) hours as needed (abdominal spasms).) 30 tablet 0   senna-docusate (SENOKOT-S) 8.6-50 MG tablet Take 1 tablet by mouth daily. (Patient not taking: Reported on 11/15/2020) 60 tablet 6   TRUEplus Lancets 28G MISC Use as directed 100 each 4   atorvastatin (LIPITOR) 40 MG tablet Take 1 tablet (40 mg total) by mouth daily. 90 tablet 1   carvedilol (COREG) 25 MG tablet Take 1 tablet (25 mg total) by mouth 2 (two) times daily with a meal. 180 tablet 0   esomeprazole (NEXIUM) 40 MG capsule Take 1 capsule (40 mg total) by mouth 2 (two) times daily before a meal. (Patient not taking: Reported on 11/15/2020) 180 capsule 1   esomeprazole (NEXIUM) 40 MG capsule TAKE 1 CAPSULE (40 MG TOTAL) BY MOUTH DAILY. (Patient not taking: Reported on  11/15/2020) 30 capsule 3  FARXIGA 10 MG TABS tablet Take 10 mg by mouth every morning. (Patient not taking: Reported on 11/15/2020)     furosemide (LASIX) 20 MG tablet Take 1 tablet (20 mg total) by mouth daily as needed. (Patient not taking: Reported on 11/15/2020) 60 tablet 2   gabapentin (NEURONTIN) 300 MG capsule TAKE 2 CAPSULES (600 MG TOTAL) BY MOUTH 2 (TWO) TIMES DAILY. (Patient not taking: Reported on 11/15/2020) 180 capsule 1   insulin glargine (LANTUS) 100 unit/mL SOPN Inject 20 Units into the skin at bedtime. (Patient taking differently: Inject 20-25 Units into the skin daily.) 6 mL 2   losartan (COZAAR) 100 MG tablet Take 1 tablet (100 mg total) by mouth daily. 90 tablet 0   metFORMIN (GLUCOPHAGE) 1000 MG tablet Take 1,000 mg by mouth daily.     spironolactone (ALDACTONE) 25 MG tablet Take 1 tablet (25 mg total) by mouth daily. 30 tablet 2   sucralfate (CARAFATE) 1 g tablet TAKE 1 TABLET (1 G TOTAL) BY MOUTH 4 (FOUR) TIMES DAILY - WITH MEALS AND AT BEDTIME. (Patient not taking: Reported on 11/15/2020) 90 tablet 2   SYNTHROID 150 MCG tablet Take 1 tablet (150 mcg total) by mouth daily. 90 tablet 0   No facility-administered medications prior to visit.    Review of Systems  Constitutional:  Positive for fatigue. Negative for chills, diaphoresis and fever.  HENT: Negative.    Eyes:  Positive for visual disturbance. Negative for pain, discharge and redness.  Respiratory:  Negative for cough, chest tightness and shortness of breath.   Cardiovascular:  Negative for chest pain, palpitations and leg swelling.  Gastrointestinal:  Positive for abdominal distention, abdominal pain, diarrhea and nausea. Negative for constipation and vomiting.  Endocrine: Positive for polydipsia and polyuria.  Genitourinary: Negative.   Musculoskeletal: Negative.   Skin: Negative.   Neurological: Negative.  Negative for weakness and numbness.  Psychiatric/Behavioral: Negative.        Objective:   Physical  Exam Constitutional:      General: She is not in acute distress.    Appearance: Normal appearance. She is not ill-appearing.  HENT:     Head: Normocephalic and atraumatic.     Mouth/Throat:     Mouth: Mucous membranes are moist.     Pharynx: Oropharynx is clear.  Eyes:     Pupils: Pupils are equal, round, and reactive to light.  Cardiovascular:     Rate and Rhythm: Normal rate and regular rhythm.     Pulses: Normal pulses.     Heart sounds: Normal heart sounds.  Pulmonary:     Effort: Pulmonary effort is normal.     Breath sounds: Normal breath sounds.  Abdominal:     General: Abdomen is flat. There is no distension.     Tenderness: There is abdominal tenderness (diffuse tenderness worse in epigastric). There is no right CVA tenderness, left CVA tenderness, guarding or rebound.     Hernia: No hernia is present.  Musculoskeletal:     Cervical back: Neck supple.  Skin:    General: Skin is warm and dry.  Neurological:     General: No focal deficit present.     Mental Status: She is alert.  Psychiatric:        Mood and Affect: Mood normal.        Behavior: Behavior normal.    Vitals:   11/25/20 0936 11/25/20 1031  BP: (!) 181/117 (!) 166/88  Pulse: 81   SpO2: 99%   Weight: 164 lb  3.2 oz (74.5 kg)     No results found. Lab Results  Component Value Date   HGBA1C 12.4 (H) 11/15/2020      Assessment & Plan:  I personally reviewed all images and lab data in the [facility] system as well as any outside material available during this office visit and agree with the  radiology impressions.   Essential hypertension Uncontrolled BP readings on current regimen. Plan to replace Losartan with Valsartan 320 mg and add amlodipine 10 mg to regimen. Continue Coreg 25mg  bid and spirinolactone 25mg  daily. All medications were refilled and sent to our local pharmacy in clinic. Plan to return to office in 2 weeks for recheck.   Type 2 diabetes mellitus without complication, with long-term  current use of insulin ([place]) Uncontrolled due to lack of insurance and financial status. Since patient is self-pay, she will get medication assistance through this clinic. Plan to restart Farxiga 10mg  as she has tried this in the past with good results. Also plan to switch Metformin to XR form and will start with 1000 mg per day. Patient will meet with Clinical pharmacist Lurena Joiner in 7 days for a recheck.   Postoperative hypothyroidism Continue synthroid 157mcg no changes made to prescription.   Fibroids Elective hysterectomy due to uterine fibroids was cancelled due to inadequate glycemic control. She will follow up with her GYN to discuss when she can be rescheduled once her diabetes is better controlled.   63min spent chart review, hx/px, pt education, complex medical decision making , collaboration with Clin PharmD

## 2020-11-25 ENCOUNTER — Other Ambulatory Visit: Payer: Self-pay

## 2020-11-25 ENCOUNTER — Encounter: Payer: Self-pay | Admitting: Critical Care Medicine

## 2020-11-25 ENCOUNTER — Ambulatory Visit: Payer: Self-pay | Attending: Critical Care Medicine | Admitting: Critical Care Medicine

## 2020-11-25 VITALS — BP 166/88 | HR 81 | Wt 164.2 lb

## 2020-11-25 DIAGNOSIS — D219 Benign neoplasm of connective and other soft tissue, unspecified: Secondary | ICD-10-CM

## 2020-11-25 DIAGNOSIS — Z794 Long term (current) use of insulin: Secondary | ICD-10-CM

## 2020-11-25 DIAGNOSIS — E890 Postprocedural hypothyroidism: Secondary | ICD-10-CM

## 2020-11-25 DIAGNOSIS — I10 Essential (primary) hypertension: Secondary | ICD-10-CM

## 2020-11-25 DIAGNOSIS — E1142 Type 2 diabetes mellitus with diabetic polyneuropathy: Secondary | ICD-10-CM

## 2020-11-25 DIAGNOSIS — E119 Type 2 diabetes mellitus without complications: Secondary | ICD-10-CM

## 2020-11-25 DIAGNOSIS — E1165 Type 2 diabetes mellitus with hyperglycemia: Secondary | ICD-10-CM

## 2020-11-25 LAB — GLUCOSE, POCT (MANUAL RESULT ENTRY): POC Glucose: 212 mg/dl — AB (ref 70–99)

## 2020-11-25 MED ORDER — AMLODIPINE BESYLATE 10 MG PO TABS
10.0000 mg | ORAL_TABLET | Freq: Every day | ORAL | 1 refills | Status: DC
  Filled 2020-11-25: qty 30, 30d supply, fill #0
  Filled 2021-01-20: qty 30, 30d supply, fill #1

## 2020-11-25 MED ORDER — METFORMIN HCL ER 500 MG PO TB24
1000.0000 mg | ORAL_TABLET | Freq: Every day | ORAL | 1 refills | Status: DC
  Filled 2020-11-25: qty 60, 30d supply, fill #0

## 2020-11-25 MED ORDER — VALSARTAN 320 MG PO TABS
320.0000 mg | ORAL_TABLET | Freq: Every day | ORAL | 3 refills | Status: DC
  Filled 2020-11-25: qty 30, 30d supply, fill #0
  Filled 2021-01-20: qty 30, 30d supply, fill #1

## 2020-11-25 MED ORDER — SPIRONOLACTONE 25 MG PO TABS
25.0000 mg | ORAL_TABLET | Freq: Every day | ORAL | 2 refills | Status: DC
  Filled 2020-11-25: qty 30, 30d supply, fill #0

## 2020-11-25 MED ORDER — SYNTHROID 150 MCG PO TABS
150.0000 ug | ORAL_TABLET | Freq: Every day | ORAL | 0 refills | Status: DC
Start: 2020-11-25 — End: 2021-04-01
  Filled 2020-11-25: qty 30, 30d supply, fill #0

## 2020-11-25 MED ORDER — ATORVASTATIN CALCIUM 40 MG PO TABS
40.0000 mg | ORAL_TABLET | Freq: Every day | ORAL | 1 refills | Status: DC
  Filled 2020-11-25: qty 90, 90d supply, fill #0

## 2020-11-25 MED ORDER — GABAPENTIN 300 MG PO CAPS
ORAL_CAPSULE | Freq: Two times a day (BID) | ORAL | 1 refills | Status: DC
  Filled 2020-11-25: qty 120, 30d supply, fill #0

## 2020-11-25 MED ORDER — FARXIGA 10 MG PO TABS
10.0000 mg | ORAL_TABLET | Freq: Every morning | ORAL | 2 refills | Status: DC
  Filled 2020-11-25: qty 30, 30d supply, fill #0
  Filled 2021-01-20: qty 30, 30d supply, fill #1

## 2020-11-25 MED ORDER — FUROSEMIDE 20 MG PO TABS
20.0000 mg | ORAL_TABLET | Freq: Two times a day (BID) | ORAL | 2 refills | Status: DC | PRN
  Filled 2020-11-25: qty 60, 30d supply, fill #0

## 2020-11-25 MED ORDER — INSULIN GLARGINE 100 UNITS/ML SOLOSTAR PEN
30.0000 [IU] | PEN_INJECTOR | Freq: Every day | SUBCUTANEOUS | 2 refills | Status: DC
  Filled 2020-11-25 – 2020-12-18 (×2): qty 6, 20d supply, fill #0
  Filled 2021-01-29: qty 12, 40d supply, fill #1

## 2020-11-25 MED ORDER — CARVEDILOL 25 MG PO TABS
25.0000 mg | ORAL_TABLET | Freq: Two times a day (BID) | ORAL | 0 refills | Status: DC
  Filled 2020-11-25: qty 180, 90d supply, fill #0

## 2020-11-25 NOTE — Patient Instructions (Signed)
Increase Lantus to 30 units daily  Resume Farxiga 10 mg daily patient assistance will be provided  Change metformin to metformin XR 500 mg 2 daily with food  Discontinue losartan and begin valsartan 320 mg daily  Resume amlodipine 10 mg daily  Continue furosemide twice daily  We discussed eating more fiber in your diet  Follow-up with [facility]  clinical pharmacist in 1 week and see Dr. Joya Gaskins again in 3 weeks okay to double book  All medications sent to our pharmacy so we can provide you patient assistance

## 2020-11-25 NOTE — Assessment & Plan Note (Addendum)
Uncontrolled BP readings on current regimen. Plan to replace Losartan with Valsartan 320 mg and add amlodipine 10 mg to regimen. Continue Coreg '25mg'$  bid and spirinolactone '25mg'$  daily. All medications were refilled and sent to our local pharmacy in clinic. Plan to return to office in 2 weeks for recheck.

## 2020-11-25 NOTE — Assessment & Plan Note (Signed)
Continue synthroid 169mg no changes made to prescription.

## 2020-11-25 NOTE — Assessment & Plan Note (Signed)
Elective hysterectomy due to uterine fibroids was cancelled due to inadequate glycemic control. She will follow up with her GYN to discuss when she can be rescheduled once her diabetes is better controlled.

## 2020-11-25 NOTE — Assessment & Plan Note (Signed)
Uncontrolled due to lack of insurance and financial status. Since patient is self-pay, she will get medication assistance through this clinic. Plan to restart Farxiga '10mg'$  as she has tried this in the past with good results. Also plan to switch Metformin to XR form and will start with 1000 mg per day. Patient will meet with Clinical pharmacist Lurena Joiner in 7 days for a recheck.

## 2020-11-26 ENCOUNTER — Ambulatory Visit: Payer: Self-pay

## 2020-11-26 ENCOUNTER — Other Ambulatory Visit: Payer: Self-pay

## 2020-11-27 ENCOUNTER — Other Ambulatory Visit: Payer: Self-pay

## 2020-12-02 ENCOUNTER — Other Ambulatory Visit: Payer: Self-pay

## 2020-12-02 ENCOUNTER — Encounter: Payer: Self-pay | Admitting: Pharmacist

## 2020-12-02 ENCOUNTER — Ambulatory Visit: Payer: Self-pay | Attending: Critical Care Medicine | Admitting: Pharmacist

## 2020-12-02 VITALS — BP 124/86

## 2020-12-02 DIAGNOSIS — E119 Type 2 diabetes mellitus without complications: Secondary | ICD-10-CM

## 2020-12-02 DIAGNOSIS — Z794 Long term (current) use of insulin: Secondary | ICD-10-CM

## 2020-12-02 LAB — GLUCOSE, POCT (MANUAL RESULT ENTRY): POC Glucose: 245 mg/dl — AB (ref 70–99)

## 2020-12-02 MED ORDER — TRULICITY 0.75 MG/0.5ML ~~LOC~~ SOAJ
0.7500 mg | SUBCUTANEOUS | 0 refills | Status: DC
  Filled 2020-12-02: qty 2, 28d supply, fill #0

## 2020-12-02 NOTE — Progress Notes (Signed)
S:    PCP: Dr. Joya Gaskins   No chief complaint on file.  Patient arrives in good spirits.  Presents for diabetes evaluation, education, and management. Patient was referred and last seen by Primary Care Provider on 11/25/2020. At that visit, her losartan was changed to valsartan. Also, amlodipine was added to her regimen. In regards to her DM medications, her metformin was changed to XR. [name] was restarted and insulin dose was increased to 30 units daily.   Pt reports that she is doing okay today. She is tolerating the Lantus increase and Farxiga restart well. Pt has a hx of GI upset, GERD as well as diarrhea with metformin use in the past. She is tolerating the XR dose okay but had to reduce to one 500 mg tablet daily d/t diarrhea with the two 500 mg tablets. She reveals today that she used to take and tolerate Trulicity well in the past. She went off of this d/t cost, not because of any side effects. Denies any hx of pancreatitis.   Family/Social History:  -Fhx: CHF -Tobacco: former smoker (quit in 2020)  -Alcohol: none reported   Insurance coverage/medication affordability: self pay  Medication adherence reported. She can only tolerate the metformin once daily.    Current diabetes medications include: Farxiga 10 mg daily, metformin 500 mg XR daily, Lantus 30 units daily  Current hypertension medications include: amlodipine 10 mg daily, carvedilol 25 mg BID, spironolactone 25 mg daily, valsartan 320 mg daily  **uses furosemide for fluid Current hyperlipidemia medications include: atorvastatin 40 mg daily   Patient denies hypoglycemic events.  Patient reported dietary habits:  - Fasts intermittently  - Snacks on fruit  Patient-reported exercise habits:  - walks, not every day, but as much as she can - dances    Patient endorses polyuria.  Patient denies neuropathy (nerve pain). Patient endorses visual changes. Patient reports self foot exams.     O:  POCT: 245  Lab  Results  Component Value Date   HGBA1C 12.4 (H) 11/15/2020   Vitals:   12/02/20 1427  BP: 124/86    Lipid Panel  No results found for: CHOL, TRIG, HDL, CHOLHDL, VLDL, LDLCALC, LDLDIRECT  Home fasting blood sugars: 120s-140s   2 hour post-meal/random blood sugars: not checking.  Clinical Atherosclerotic Cardiovascular Disease (ASCVD): No  The ASCVD Risk score Mikey Bussing DC Jr., et al., 2013) failed to calculate for the following reasons:   Cannot find a previous HDL lab    A/P: Diabetes longstanding currently uncontrolled. Patient is able to verbalize appropriate hypoglycemia management plan. Medication adherence appears appropriate. She is interested in trying Trulicity again. Will have her start this, however, we will watch closely given her GI symptoms. If she cannot tolerate the Trulicity then we will have to consider bolus insulin. I do not have any post-prandial home CBG data, however, her CBG in clinic today is post-prandial and elevated.  -Started Trulicity [id number] mg weekly.  -Continue current doses of metformin, Farxiga, and Lantus.  -Extensively discussed pathophysiology of diabetes, recommended lifestyle interventions, dietary effects on blood sugar control -Counseled on s/sx of and management of hypoglycemia -Next A1C anticipated 01/2021.   Hypertension longstanding currently improving.  Blood pressure goal = 130/80 mmHg. Medication adherence reported with no side effects.  -Continue amlodipine 10 mg daily.  -Continue valsartan 320 mg daily -Continue carvedilol 25 mg BID -Continue spironolactone 25 mg daily -Continue furosemide 20 mg BID prn for fluid.   Written patient instructions provided.  Total time  in face to face counseling 30 minutes.   Follow up Pharmacist Clinic Visit in 2-3 weeks.    Benard Halsted, PharmD, [facility] [place], [place] [phone]

## 2020-12-03 ENCOUNTER — Telehealth: Payer: Self-pay

## 2020-12-03 NOTE — Telephone Encounter (Signed)
Spoke to pt about scheduling with the mobile mammo event that is taking place on Sept 21. Pt stated that she had an appt with [facility] but they were going to charge her for services. Pt stated that she thought she had already completed the scholarship information.   Can a new scholarship be sent to her?

## 2020-12-11 ENCOUNTER — Other Ambulatory Visit: Payer: Self-pay

## 2020-12-15 ENCOUNTER — Other Ambulatory Visit: Payer: Self-pay | Admitting: Critical Care Medicine

## 2020-12-15 DIAGNOSIS — I10 Essential (primary) hypertension: Secondary | ICD-10-CM

## 2020-12-15 NOTE — Telephone Encounter (Signed)
Last RF 11/25/20 #30 2 RF

## 2020-12-18 ENCOUNTER — Encounter: Payer: Self-pay | Admitting: Critical Care Medicine

## 2020-12-18 ENCOUNTER — Ambulatory Visit: Payer: Self-pay | Attending: Critical Care Medicine | Admitting: Critical Care Medicine

## 2020-12-18 ENCOUNTER — Other Ambulatory Visit: Payer: Self-pay

## 2020-12-18 VITALS — BP 115/79 | HR 74 | Ht 63.0 in | Wt 162.4 lb

## 2020-12-18 DIAGNOSIS — I5030 Unspecified diastolic (congestive) heart failure: Secondary | ICD-10-CM

## 2020-12-18 DIAGNOSIS — Z794 Long term (current) use of insulin: Secondary | ICD-10-CM

## 2020-12-18 DIAGNOSIS — E785 Hyperlipidemia, unspecified: Secondary | ICD-10-CM

## 2020-12-18 DIAGNOSIS — E1169 Type 2 diabetes mellitus with other specified complication: Secondary | ICD-10-CM

## 2020-12-18 DIAGNOSIS — I10 Essential (primary) hypertension: Secondary | ICD-10-CM

## 2020-12-18 DIAGNOSIS — D219 Benign neoplasm of connective and other soft tissue, unspecified: Secondary | ICD-10-CM

## 2020-12-18 DIAGNOSIS — E119 Type 2 diabetes mellitus without complications: Secondary | ICD-10-CM

## 2020-12-18 DIAGNOSIS — I110 Hypertensive heart disease with heart failure: Secondary | ICD-10-CM

## 2020-12-18 LAB — POCT GLYCOSYLATED HEMOGLOBIN (HGB A1C): HbA1c, POC (controlled diabetic range): 11 % — AB (ref 0.0–7.0)

## 2020-12-18 LAB — GLUCOSE, POCT (MANUAL RESULT ENTRY): POC Glucose: 97 mg/dl (ref 70–99)

## 2020-12-18 MED ORDER — SPIRONOLACTONE 25 MG PO TABS: 25.0000 mg | ORAL_TABLET | Freq: Every day | ORAL | 2 refills | Status: DC

## 2020-12-18 NOTE — Assessment & Plan Note (Signed)
Needs a surgical approach but cannot achieve this until she has improved glycemic control

## 2020-12-18 NOTE — Patient Instructions (Addendum)
No change in medications  We checked her hemoglobin A1c today and it was: 11.0  Continue healthy diet  We will send report to your short-term disability insurance company regarding this visit  Return to see Dr. Joya Gaskins 1 month

## 2020-12-18 NOTE — Progress Notes (Signed)
 Subjective:    Patient ID: Suzanne Cline, female    DOB: 02/25/1966, 55 y.o.   MRN: [id number]  55 y.o.F PCP to est former pt of dr Fulp Hx of HFpEF, HTN, MVR, T2DM, HLD, tobacco,GERD with reflux esophagitis without bleeding on 09/2019 EGD with [facility]  This patient is seen in follow-up and to establish primary care.  The patient formally was a patient of Dr. Fulp who is no longer in the practice and the patient is here now for the first visit with me.  Patient history dates back and that she has hypertension, heart failure with preserved ejection fraction EF 65% with thickened left ventricle and diastolic heart failure, mitral regurgitation, type 2 diabetes, hyperlipidemia, tobacco use, reflux disease with recent upper endoscopy biopsy showing reflux esophagitis only   Patient has longstanding issues of upper abdominal pain left upper quadrant and epigastric pain she has constipation with this as well it feels like a tightness in the epigastric area and radiates into the back.  She has been taking once daily Nexium without much improvement in the symptoms.  She has 1 bowel movement every 3 to 4 days.  She does vape CBD oil but is no longer smoking cigarettes.  Patient is on thyroid supplementation at this time.  Note on arrival hemoglobin A1c was 6.1.  She is on the Lantus 16 units daily and also glipizide twice daily.  Blood sugar on arrival today was 78.  She notes she misses meals on several occasions.  Note this patient needs a mammogram hepatitis C and HIV screening.  She also needs colon cancer screening.  Note this patient is self-pay.  This patient also has a history of yet to receive Covid vaccine and is also refusing flu vaccine at this visit.  Patient still has a lot of misinformation and fears about the Covid vaccine that she states.  Note the patient does not check her blood sugar on a regular basis.  She will miss many meals.  The patient does get refills from [facility]  program and is requesting many of her chronic medications be sent to that location with 90-day refills   04/30/20 This patient is seen in short-term follow-up and is still having abdominal pain now radiating down to the pelvic area with incontinence.  She is now having bowel movements at least more often 1-2 times daily instead of every 3 to 4 days.  Despite being on high-dose Nexium the abdominal pain persists.  She states it is a pulling tightness sensation a sensation of fullness and pushing upwards towards the chest from the lower abdomen.  She does have a large uterine fibroid that is needed to be removed with hysterectomy.  She was awaiting clearance from her other physicians.  I pick this patient up in December and now that I have a better understanding of her cardiac status and the fact that her diabetes is well controlled with hemoglobin A1c down to 6.1 we can expedite referral back to gynecology for consideration of hysterectomy.  The patient on arrival has a blood pressure 132/87 which is in good range.  The patient maintains Synthroid for postoperative hypothyroidism.  She is maintaining Lantus 20 units daily and Jardiance 10 mg daily blood sugars have been in the 110 range she is now off the glipizide.   We discussed again during the interview the importance of a Covid vaccine she is now willing to proceed and request a Pfizer vaccine.  The patient also is   yet to turn in all get as it was not processed and instead was mailed in and somehow lost so a new kit will have to be issued  07/01/20 This patient is seen in return follow-up and on arrival is quite distressed initial blood pressure was 163/97 and when was down to 145/67.  Patient states that she has had a job change and she is now underinsured and cannot afford some of the medication.  She did get the [place] but says she prefers the [place].  She complains of abdominal pain that is in the epigastric and lower abdominal areas.  She has  history of gastric antritis and also history of esophagitis.  In addition to this the patient has history of uterine fibroids which causes lower abdominal pain.  She states after eating she will get diarrhea and also have abdominal swelling.  She states when she was on Carafate in the past this improves her symptoms.  She is not been taking Nexium on a regular basis.  On arrival CBG was 158 and A1c was 6.9.  Her insurance would not cover Jardiance she is now on for [place] along with the Lantus and she has been taking both daily however there was a covrage gap   For blood pressure the patient states she has been compliant with losartan, Coreg, spironolactone.  Also has been compliant with atorvastatin and Synthroid.  Also taking gabapentin for neuropathy.  She states the Levsin has helped her abdominal pain as well.  The patient is yet to obtain a Covid booster she states she will receive that this week  8/1  Patient was scheduled for an elective hysterectomy due to uterine fibroids but the surgery was cancelled due to uncontrolled diabetes. Her employer recently changed health insurance to a plan that does not cover her medications. She has had a difficult time paying for her medications and reports only being able to take 25 units of Lantus every day. She also recently resumed metformin 1000 mg BID and says it causes abdomina pain and diarrhea. For the past three weeks she endorses blurry vision, polydipsia and polyuria. She denies neuropathy. For breakfast she commonly eats fruit, Atkins shakes or home made smoothies. For protein she will eat chicken or shrimp. Her exercise regimen is limited to small walks as her abdominal pain has restricted her. Her most recent A1C in July 2022 was 12.4  She reports her abdominal pain is unchanged and not improved with omeprazole or hyoscyamine. She often complains of bloating and feeling "inflamed" with nausea and occasional vomiting. She has a history of  constipation but has had more diarrhea this week since resuming metformin.   She is able to maintain her blood pressure and thyroid replacement medications. BP is elevated today at 181/117 and she complains of abdominal pain and bloating. Denies chest pains, SOB or palpitations.  Patient declined to receive any vaccines at this visit, she states she will pursue Covid vaccine first and then get her Tdap later   12/18/2020 Patient returns today and blood pressure is improved on arrival 115/79.  Patient maintains Coreg spironolactone valsartan and amlodipine.  Unfortunately her diabetes is still not yet at goal she comes in 1 arrival hemoglobin A1c of 11.  Patient does state her blood sugars in the 100 215 range at home. She is drinking a lot of smoothies at home which may be contributing to hyperglycemia.  She is otherwise trying to improve her diet.  She does decline all vaccinations at this  time.  She does have a mammogram pending and she is awaiting to get approval for scholarship funding for this.  She is giving consideration to potential COVID vaccination series  Patient currently is not able to work and her last day of work was November 14, 2020.  She has had chronic abdominal pain and needs to have fibroids and a hysterectomy performed.  The hysterectomy was scheduled but had to be canceled because of the hyperglycemia    Past Medical History:  Diagnosis Date   CHF (congestive heart failure) ([place])    Diabetes mellitus without complication ([place])    Fibroids, intramural    s/p uterine artery embolization   Former cigarette smoker 04/2011   History of Resolved Nonischemic Congestive Cardiomyopathy ([place]) 04/2011   Echo March 2013: EF 35% with mild/moderate diastolic dysfunction -> Follow-Up Echo March 2014 EF 55% with G1 DD.   Hypertension    Hypertensive heart disease    Difficult control hypertension   Mild concentric left ventricular hypertrophy (LVH) 10/08/2019   Moderate mitral valve  regurgitation 10/08/2019   Echo done June 2021; Dr. Ellyn Hack   Neuromuscular disorder [facility])    diabetic neuropathy   OSA (obstructive sleep apnea)    Sleep apnea    doesn't use cpap regularly   Thyroid disease    post thyroidectomy     Family History  Problem Relation Age of Onset   Congestive Heart Failure Mother    Breast cancer Neg Hx    Colon cancer Neg Hx    Esophageal cancer Neg Hx    Rectal cancer Neg Hx    Stomach cancer Neg Hx    Inflammatory bowel disease Neg Hx    Liver disease Neg Hx    Pancreatic cancer Neg Hx      Social History   Socioeconomic History   Marital status: Single    Spouse name: Not on file   Number of children: Not on file   Years of education: Not on file   Highest education level: Not on file  Occupational History   Not on file  Tobacco Use   Smoking status: Former    Types: Cigars    Quit date: 03/2019    Years since quitting: 1.7   Smokeless tobacco: Never   Tobacco comments:    smokes occasional cigar  Vaping Use   Vaping Use: Never used  Substance and Sexual Activity   Alcohol use: Yes    Comment: social   Drug use: Never   Sexual activity: Not Currently  Other Topics Concern   Not on file  Social History Narrative   Not on file   Social Determinants of Health   Financial Resource Strain: Not on file  Food Insecurity: Not on file  Transportation Needs: Not on file  Physical Activity: Not on file  Stress: Not on file  Social Connections: Not on file  Intimate Partner Violence: Not on file     Allergies  Allergen Reactions   Lisinopril Cough   Oxycodone Itching     Outpatient Medications Prior to Visit  Medication Sig Dispense Refill   acyclovir (ZOVIRAX) 400 MG tablet Take 1 tablet (400 mg total) by mouth 5 (five) times daily. 60 tablet 1   amLODipine (NORVASC) 10 MG tablet Take 1 tablet (10 mg total) by mouth daily. 90 tablet 1   atorvastatin (LIPITOR) 40 MG tablet Take 1 tablet (40 mg total) by mouth daily.  90 tablet 1   Blood Glucose Monitoring Suppl (  TRUE METRIX METER) w/Device KIT Use as directed 1 kit 0   carvedilol (COREG) 25 MG tablet Take 1 tablet (25 mg total) by mouth 2 (two) times daily with a meal. 180 tablet 0   Dulaglutide (TRULICITY) 0.75 MG/0.5ML SOPN Inject 0.75 mg into the skin once a week. 2 mL 0   FARXIGA 10 MG TABS tablet Take 1 tablet (10 mg total) by mouth every morning. 30 tablet 2   furosemide (LASIX) 20 MG tablet Take 1 tablet (20 mg total) by mouth 2 (two) times daily as needed. 60 tablet 2   gabapentin (NEURONTIN) 300 MG capsule TAKE 2 CAPSULES (600 MG TOTAL) BY MOUTH 2 (TWO) TIMES DAILY. 180 capsule 1   glucose blood (TRUE METRIX BLOOD GLUCOSE TEST) test strip Use as instructed 100 each 12   hyoscyamine (LEVSIN) 0.125 MG tablet TAKE 1 TABLET BY MOUTH EVERY 4 HOURS AS NEEDED (Patient taking differently: Take 0.125 mg by mouth every 4 (four) hours as needed (abdominal spasms).) 30 tablet 0   insulin glargine (LANTUS) 100 unit/mL SOPN Inject 30 Units into the skin at bedtime. 6 mL 2   metFORMIN (GLUCOPHAGE XR) 500 MG 24 hr tablet Take 2 tablets (1,000 mg total) by mouth daily with breakfast. 180 tablet 1   senna-docusate (SENOKOT-S) 8.6-50 MG tablet Take 1 tablet by mouth daily. 60 tablet 6   SYNTHROID 150 MCG tablet Take 1 tablet (150 mcg total) by mouth daily. 90 tablet 0   TRUEplus Lancets 28G MISC Use as directed 100 each 4   valsartan (DIOVAN) 320 MG tablet Take 1 tablet (320 mg total) by mouth daily. 90 tablet 3   spironolactone (ALDACTONE) 25 MG tablet Take 1 tablet (25 mg total) by mouth daily. 30 tablet 2   No facility-administered medications prior to visit.    Review of Systems  Constitutional:  Positive for fatigue. Negative for chills, diaphoresis and fever.  HENT: Negative.    Eyes:  Positive for visual disturbance. Negative for pain, discharge and redness.  Respiratory:  Negative for cough, chest tightness and shortness of breath.   Cardiovascular:   Negative for chest pain, palpitations and leg swelling.  Gastrointestinal:  Positive for abdominal distention, abdominal pain, diarrhea and nausea. Negative for constipation and vomiting.  Endocrine: Positive for polydipsia and polyuria.  Genitourinary: Negative.   Musculoskeletal: Negative.   Skin: Negative.   Neurological: Negative.  Negative for weakness and numbness.  Psychiatric/Behavioral: Negative.        Objective:   Physical Exam Constitutional:      General: She is not in acute distress.    Appearance: Normal appearance. She is not ill-appearing.  HENT:     Head: Normocephalic and atraumatic.     Mouth/Throat:     Mouth: Mucous membranes are moist.     Pharynx: Oropharynx is clear.  Eyes:     Pupils: Pupils are equal, round, and reactive to light.  Cardiovascular:     Rate and Rhythm: Normal rate and regular rhythm.     Pulses: Normal pulses.     Heart sounds: Normal heart sounds.  Pulmonary:     Effort: Pulmonary effort is normal.     Breath sounds: Normal breath sounds.  Abdominal:     General: Abdomen is flat. There is no distension.     Tenderness: There is abdominal tenderness (diffuse tenderness worse in epigastric). There is no right CVA tenderness, left CVA tenderness, guarding or rebound.     Hernia: No hernia is present.  Musculoskeletal:     Cervical back: Neck supple.  Skin:    General: Skin is warm and dry.  Neurological:     General: No focal deficit present.     Mental Status: She is alert.  Psychiatric:        Mood and Affect: Mood normal.        Behavior: Behavior normal.    Vitals:   12/18/20 1058  BP: 115/79  Pulse: 74  SpO2: 99%  Weight: 162 lb 6.4 oz (73.7 kg)  Height: $Remove'5\' 3"'mcIIbVi$  (1.6 m)    No results found. Lab Results  Component Value Date   HGBA1C 11.0 (A) 12/18/2020      Assessment & Plan:  I personally reviewed all images and lab data in the [facility] system as well as any outside material available during this office visit and  agree with the  radiology impressions.   Hypertensive heart disease with diastolic heart failure (HCC) Hypertensive heart disease with diastolic heart failure stable at this time continue current medications  Essential hypertension Blood pressure well controlled no change in medicines  Type 2 diabetes mellitus without complication, with long-term current use of insulin (HCC) A1c not yet at goal is at 11 it is improved  Continue current medication profile and have the patient back to see the clinical pharmacist 2 weeks  Hyperlipidemia associated with type 2 diabetes mellitus ([place]) Continue cholesterol therapy  Fibroids Needs a surgical approach but cannot achieve this until she has improved glycemic control   Brianca was seen today for diabetes.  Diagnoses and all orders for this visit:  Type 2 diabetes mellitus without complication, with long-term current use of insulin (HCC) -     POCT glucose (manual entry) -     HgB A1c  Essential hypertension -     spironolactone (ALDACTONE) 25 MG tablet; Take 1 tablet (25 mg total) by mouth daily.  Hypertensive heart disease with diastolic heart failure (HCC)  Hyperlipidemia associated with type 2 diabetes mellitus ([place])  Fibroids

## 2020-12-18 NOTE — Assessment & Plan Note (Signed)
Continue cholesterol therapy

## 2020-12-18 NOTE — Assessment & Plan Note (Signed)
Blood pressure well controlled no change in medicines

## 2020-12-18 NOTE — Progress Notes (Signed)
Still having blurred vision  Medication refills

## 2020-12-18 NOTE — Assessment & Plan Note (Signed)
Hypertensive heart disease with diastolic heart failure stable at this time continue current medications

## 2020-12-18 NOTE — Assessment & Plan Note (Signed)
A1c not yet at goal is at 11 it is improved  Continue current medication profile and have the patient back to see the clinical pharmacist 2 weeks

## 2020-12-24 ENCOUNTER — Other Ambulatory Visit: Payer: Self-pay

## 2020-12-24 ENCOUNTER — Encounter: Payer: Self-pay | Admitting: Pharmacist

## 2020-12-24 ENCOUNTER — Ambulatory Visit: Payer: Self-pay | Attending: Critical Care Medicine | Admitting: Pharmacist

## 2020-12-24 DIAGNOSIS — Z794 Long term (current) use of insulin: Secondary | ICD-10-CM

## 2020-12-24 DIAGNOSIS — E119 Type 2 diabetes mellitus without complications: Secondary | ICD-10-CM

## 2020-12-24 DIAGNOSIS — I10 Essential (primary) hypertension: Secondary | ICD-10-CM

## 2020-12-24 LAB — GLUCOSE, POCT (MANUAL RESULT ENTRY): POC Glucose: 104 mg/dl — AB (ref 70–99)

## 2020-12-24 MED ORDER — TRULICITY 1.5 MG/0.5ML ~~LOC~~ SOAJ
1.5000 mg | SUBCUTANEOUS | 2 refills | Status: DC
Start: 2020-12-24 — End: 2021-04-01
  Filled 2020-12-24: qty 2, 28d supply, fill #0
  Filled 2021-01-27: qty 2, 28d supply, fill #1

## 2020-12-24 MED ORDER — HYOSCYAMINE SULFATE 0.125 MG PO TABS: 0.1250 mg | ORAL_TABLET | ORAL | 2 refills | Status: DC | PRN

## 2020-12-24 NOTE — Progress Notes (Signed)
    S:    PCP: Dr. Joya Gaskins   No chief complaint on file.  Patient arrives in good spirits.  Presents for diabetes evaluation, education, and management. Patient was referred and last seen by Primary Care Provider on 12/18/2020. She reports that she is doing okay on Trulicity. Denies any NV, abdominal pain.   Family/Social History:  -Fhx: CHF -Tobacco: former smoker (quit in 2020)  -Alcohol: none reported   Insurance coverage/medication affordability: self pay  Medication adherence reported.  Current diabetes medications include: Farxiga 10 mg daily, metformin 1000 mg XR daily (takes two 500 mg tablets with breakfast), Lantus 30 units daily, Trulicity [id number] mg weekly  Current hypertension medications include: amlodipine 10 mg daily, carvedilol 25 mg BID, spironolactone 25 mg daily, valsartan 320 mg daily  **uses furosemide for fluid Current hyperlipidemia medications include: atorvastatin 40 mg daily   Patient denies hypoglycemic events.  Patient reported dietary habits:  - Fasts intermittently  - Snacks on fruit  Patient-reported exercise habits:  - walks, not every day, but as much as she can - dances    Patient denies polyuria.  Patient denies neuropathy (nerve pain). Patient endorses blurred vision.  Patient reports self foot exams.     O:  POCT: 104  Lab Results  Component Value Date   HGBA1C 11.0 (A) 12/18/2020   There were no vitals filed for this visit.   Lipid Panel  No results found for: CHOL, TRIG, HDL, CHOLHDL, VLDL, LDLCALC, LDLDIRECT  Home fasting blood sugars: 120s-140s   2 hour post-meal/random blood sugars: not checking.  Clinical Atherosclerotic Cardiovascular Disease (ASCVD): No  The ASCVD Risk score Mikey Bussing DC Jr., et al., 2013) failed to calculate for the following reasons:   Cannot find a previous HDL lab    A/P: Diabetes longstanding currently uncontrolled. Patient is able to verbalize appropriate hypoglycemia management plan. Medication  adherence appears appropriate. We discussed increasing Trulicity dose and she is comfortable with this. I counseled her on side effects and instructed her to contact me if these occur, in which case we will need to decrease back to the 0.75 mg weekly dose. -Increase Trulicity to 1.5 mg weekly.  -Continue current doses of metformin, Farxiga, and Lantus.  -Extensively discussed pathophysiology of diabetes, recommended lifestyle interventions, dietary effects on blood sugar control -Counseled on s/sx of and management of hypoglycemia -Next A1C anticipated 01/2021.   Hypertension longstanding currently improving.  Blood pressure goal = 130/80 mmHg. Medication adherence reported with no side effects.  -Continue amlodipine 10 mg daily.  -Continue valsartan 320 mg daily -Continue carvedilol 25 mg BID -Continue spironolactone 25 mg daily -Continue furosemide 20 mg BID prn for fluid.   Written patient instructions provided.  Total time in face to face counseling 30 minutes.   Follow up Pharmacist Clinic Visit in 2-3 weeks.    Suzanne Cline, PharmD, [facility] [place], [place] [phone]

## 2020-12-25 ENCOUNTER — Telehealth: Payer: Self-pay | Admitting: Critical Care Medicine

## 2020-12-25 ENCOUNTER — Encounter: Payer: Self-pay | Admitting: Critical Care Medicine

## 2020-12-25 NOTE — Telephone Encounter (Signed)
Needing DX codes and is patient out of work until surgery.  Paperwork was faxed over today to [place]

## 2020-12-25 NOTE — Telephone Encounter (Signed)
Copied from [place] [phone]. Topic: General - Other >> Dec 23, 2020 10:49 AM Alanda Slim E wrote: Reason for CRM: [employer] called and needs ICD 10 codes for diagnoses for pts surgery and wanted to confirm pt being out of work until [place] occurs / Claim # [id number] for reference / please advise asap

## 2020-12-25 NOTE — Telephone Encounter (Signed)
Out of work til surgery  DX codes on form  it is for the Fibroid uterus

## 2020-12-26 NOTE — Telephone Encounter (Signed)
Suzanne Cline was called and  informed that paperwork has been faxed over.

## 2020-12-27 ENCOUNTER — Other Ambulatory Visit: Payer: Self-pay

## 2021-01-07 ENCOUNTER — Encounter: Payer: Self-pay | Admitting: Critical Care Medicine

## 2021-01-20 ENCOUNTER — Other Ambulatory Visit: Payer: Self-pay

## 2021-01-21 ENCOUNTER — Ambulatory Visit: Payer: Self-pay | Attending: Critical Care Medicine | Admitting: Critical Care Medicine

## 2021-01-21 ENCOUNTER — Encounter: Payer: Self-pay | Admitting: Critical Care Medicine

## 2021-01-21 ENCOUNTER — Telehealth: Payer: Self-pay | Admitting: Pharmacist

## 2021-01-21 ENCOUNTER — Other Ambulatory Visit: Payer: Self-pay

## 2021-01-21 VITALS — BP 134/92 | HR 77 | Resp 16 | Wt 164.6 lb

## 2021-01-21 DIAGNOSIS — I10 Essential (primary) hypertension: Secondary | ICD-10-CM

## 2021-01-21 DIAGNOSIS — D219 Benign neoplasm of connective and other soft tissue, unspecified: Secondary | ICD-10-CM

## 2021-01-21 DIAGNOSIS — E119 Type 2 diabetes mellitus without complications: Secondary | ICD-10-CM

## 2021-01-21 DIAGNOSIS — Z794 Long term (current) use of insulin: Secondary | ICD-10-CM

## 2021-01-21 LAB — POCT GLYCOSYLATED HEMOGLOBIN (HGB A1C): HbA1c, POC (controlled diabetic range): 8.1 % — AB (ref 0.0–7.0)

## 2021-01-21 NOTE — Telephone Encounter (Signed)
Opened in error

## 2021-01-21 NOTE — Assessment & Plan Note (Signed)
Plan surgical intervention per gynecology

## 2021-01-21 NOTE — Progress Notes (Signed)
Established Patient Office Visit  Subjective:  Patient ID: Suzanne Cline, female    DOB: 05-08-1965  Age: 55 y.o. MRN: [id number]  CC:  Chief Complaint  Patient presents with   Diabetes    HPI Suzanne Cline presents for primary care follow-up visit and preop clearance for planned hysterectomy due to fibroids  Patient is still having chronic lower pelvic pain but the good news is on arrival A1c is 8.1 and her blood sugars have been markedly improved.  She is ready now to have the surgical procedure to proceed.  I have been filling out FMLA paperwork for her all along.  Past Medical History:  Diagnosis Date   CHF (congestive heart failure) ([place])    Diabetes mellitus without complication ([place])    Fibroids, intramural    s/p uterine artery embolization   Former cigarette smoker 04/2011   History of Resolved Nonischemic Congestive Cardiomyopathy ([place]) 04/2011   Echo March 2013: EF 35% with mild/moderate diastolic dysfunction -> Follow-Up Echo March 2014 EF 55% with G1 DD.   Hypertension    Hypertensive heart disease    Difficult control hypertension   Mild concentric left ventricular hypertrophy (LVH) 10/08/2019   Moderate mitral valve regurgitation 10/08/2019   Echo done June 2021; Dr. Ellyn Hack   Neuromuscular disorder [facility])    diabetic neuropathy   OSA (obstructive sleep apnea)    Sleep apnea    doesn't use cpap regularly   Thyroid disease    post thyroidectomy    Past Surgical History:  Procedure Laterality Date   ABLATION     uterus   COLONOSCOPY     2017 -  normal: in [place], [place] CATH AND CORONARY ANGIOGRAPHY  05/19/2011   ([facility]): Normal coronary arteries   THYROIDECTOMY     TRANSTHORACIC ECHOCARDIOGRAM  07/18/2011   [facility] [place]: EF 35% with mild to moderate DD. -->   TRANSTHORACIC ECHOCARDIOGRAM     [facility] [place]: EF 55%, G1 DD.  LVIDd 4.3; July 12, 2015: EF 55 to 60%.  Normal valves   UPPER  GASTROINTESTINAL ENDOSCOPY  07/06/2019    Family History  Problem Relation Age of Onset   Congestive Heart Failure Mother    Breast cancer Neg Hx    Colon cancer Neg Hx    Esophageal cancer Neg Hx    Rectal cancer Neg Hx    Stomach cancer Neg Hx    Inflammatory bowel disease Neg Hx    Liver disease Neg Hx    Pancreatic cancer Neg Hx     Social History   Socioeconomic History   Marital status: Single    Spouse name: Not on file   Number of children: Not on file   Years of education: Not on file   Highest education level: Not on file  Occupational History   Not on file  Tobacco Use   Smoking status: Former    Types: Cigars    Quit date: 03/2019    Years since quitting: 1.8   Smokeless tobacco: Never   Tobacco comments:    smokes occasional cigar  Vaping Use   Vaping Use: Never used  Substance and Sexual Activity   Alcohol use: Yes    Comment: social   Drug use: Never   Sexual activity: Not Currently  Other Topics Concern   Not on file  Social History Narrative   Not on file   Social Determinants of Health   Financial Resource Strain: Not  on file  Food Insecurity: Not on file  Transportation Needs: Not on file  Physical Activity: Not on file  Stress: Not on file  Social Connections: Not on file  Intimate Partner Violence: Not on file    Outpatient Medications Prior to Visit  Medication Sig Dispense Refill   acyclovir (ZOVIRAX) 400 MG tablet Take 1 tablet (400 mg total) by mouth 5 (five) times daily. 60 tablet 1   amLODipine (NORVASC) 10 MG tablet Take 1 tablet (10 mg total) by mouth daily. 90 tablet 1   atorvastatin (LIPITOR) 40 MG tablet Take 1 tablet (40 mg total) by mouth daily. 90 tablet 1   Blood Glucose Monitoring Suppl (TRUE METRIX METER) w/Device KIT Use as directed 1 kit 0   carvedilol (COREG) 25 MG tablet Take 1 tablet (25 mg total) by mouth 2 (two) times daily with a meal. 180 tablet 0   Dulaglutide (TRULICITY) 1.5 VO/5.3GU SOPN Inject 1.5 mg  into the skin once a week. 2 mL 2   FARXIGA 10 MG TABS tablet Take 1 tablet (10 mg total) by mouth every morning. 30 tablet 2   furosemide (LASIX) 20 MG tablet Take 1 tablet (20 mg total) by mouth 2 (two) times daily as needed. 60 tablet 2   gabapentin (NEURONTIN) 300 MG capsule TAKE 2 CAPSULES (600 MG TOTAL) BY MOUTH 2 (TWO) TIMES DAILY. 180 capsule 1   glucose blood (TRUE METRIX BLOOD GLUCOSE TEST) test strip Use as instructed 100 each 12   hyoscyamine (LEVSIN) 0.125 MG tablet Take 1 tablet (0.125 mg total) by mouth every 4 (four) hours as needed. 30 tablet 2   insulin glargine (LANTUS) 100 unit/mL SOPN Inject 30 Units into the skin at bedtime. 6 mL 2   metFORMIN (GLUCOPHAGE XR) 500 MG 24 hr tablet Take 2 tablets (1,000 mg total) by mouth daily with breakfast. 180 tablet 1   senna-docusate (SENOKOT-S) 8.6-50 MG tablet Take 1 tablet by mouth daily. 60 tablet 6   spironolactone (ALDACTONE) 25 MG tablet Take 1 tablet (25 mg total) by mouth daily. 30 tablet 2   SYNTHROID 150 MCG tablet Take 1 tablet (150 mcg total) by mouth daily. 90 tablet 0   TRUEplus Lancets 28G MISC Use as directed 100 each 4   valsartan (DIOVAN) 320 MG tablet Take 1 tablet (320 mg total) by mouth daily. 90 tablet 3   No facility-administered medications prior to visit.    Allergies  Allergen Reactions   Lisinopril Cough   Oxycodone Itching    ROS Review of Systems  Constitutional: Negative.   HENT: Negative.  Negative for ear pain, postnasal drip, rhinorrhea, sinus pressure, sore throat, trouble swallowing and voice change.   Eyes: Negative.   Respiratory: Negative.  Negative for apnea, cough, choking, chest tightness, shortness of breath, wheezing and stridor.   Cardiovascular: Negative.  Negative for chest pain, palpitations and leg swelling.  Gastrointestinal: Negative.  Negative for abdominal distention, abdominal pain, nausea and vomiting.  Genitourinary:  Positive for dyspareunia, menstrual problem and pelvic  pain.  Musculoskeletal: Negative.  Negative for arthralgias and myalgias.  Skin: Negative.  Negative for rash.  Allergic/Immunologic: Negative.  Negative for environmental allergies and food allergies.  Neurological: Negative.  Negative for dizziness, syncope, weakness and headaches.  Hematological: Negative.  Negative for adenopathy. Does not bruise/bleed easily.  Psychiatric/Behavioral: Negative.  Negative for agitation and sleep disturbance. The patient is not nervous/anxious.      Objective:    Physical Exam  BP (!) 134/92  Pulse 77   Resp 16   Wt 164 lb 9.6 oz (74.7 kg)   SpO2 94%   BMI 29.16 kg/m  Wt Readings from Last 3 Encounters:  01/21/21 164 lb 9.6 oz (74.7 kg)  12/18/20 162 lb 6.4 oz (73.7 kg)  11/25/20 164 lb 3.2 oz (74.5 kg)     Health Maintenance Due  Topic Date Due   COVID-19 Vaccine (1) Never done   MAMMOGRAM  Never done    There are no preventive care reminders to display for this patient.  Lab Results  Component Value Date   TSH 3.590 11/14/2019   Lab Results  Component Value Date   WBC 8.5 11/15/2020   HGB 13.7 11/15/2020   HCT 42.3 11/15/2020   MCV 88.1 11/15/2020   PLT 176 11/15/2020   Lab Results  Component Value Date   NA 137 11/15/2020   K 4.0 11/15/2020   CO2 26 11/15/2020   GLUCOSE 240 (H) 11/15/2020   BUN 17 11/15/2020   CREATININE 1.10 (H) 11/15/2020   BILITOT 0.3 09/01/2019   ALKPHOS 70 09/01/2019   AST 12 09/01/2019   ALT 9 09/01/2019   PROT 7.1 09/01/2019   ALBUMIN 4.6 09/01/2019   CALCIUM 9.2 11/15/2020   ANIONGAP 10 11/15/2020   No results found for: CHOL No results found for: HDL No results found for: LDLCALC No results found for: TRIG No results found for: CHOLHDL Lab Results  Component Value Date   HGBA1C 8.1 (A) 01/21/2021      Assessment & Plan:   Problem List Items Addressed This Visit       Cardiovascular and Mediastinum   Essential hypertension (Chronic)    Blood pressure at goal we will  continue current medications        Endocrine   Type 2 diabetes mellitus without complication, with long-term current use of insulin (HCC) - Primary (Chronic)    Type 2 diabetes currently now at goal hemoglobin A1c of 8.1 I believe at this point she can be cleared medically for planned surgical intervention  We need to get the fibroids out that so this patient can return to work  No change in current medication program      Relevant Orders   HgB A1c (Completed)     Other   Fibroids    Plan surgical intervention per gynecology       No orders of the defined types were placed in this encounter.   Follow-up: Return in about 6 weeks (around 03/04/2021).    Asencion Noble, MD

## 2021-01-21 NOTE — Assessment & Plan Note (Addendum)
Type 2 diabetes currently now at goal hemoglobin A1c of 8.1 I believe at this point she can be cleared medically for planned surgical intervention  We need to get the fibroids out that so this patient can return to work  No change in current medication program

## 2021-01-21 NOTE — Assessment & Plan Note (Signed)
Blood pressure at goal we will continue current medications

## 2021-01-21 NOTE — Patient Instructions (Signed)
No change in medications  I will communicate with Dr Rip Harbour you are good for surgery ASAP  I will send in your forms to your insurance company  Return dr Joya Gaskins first of November

## 2021-01-27 ENCOUNTER — Other Ambulatory Visit: Payer: Self-pay

## 2021-01-28 ENCOUNTER — Telehealth: Payer: Self-pay | Admitting: Critical Care Medicine

## 2021-01-28 ENCOUNTER — Other Ambulatory Visit: Payer: Self-pay

## 2021-01-28 NOTE — Telephone Encounter (Signed)
Patient came by asking for a work note to extend her leave from work on her Suzanne Cline. Per patient she was told by Dr. Joya Gaskins that while he was on vacation to come by the office if an extension was needed and another doctor would write the note for. Per patient please let her know if this can be completed and mail the letter to her.

## 2021-01-29 ENCOUNTER — Other Ambulatory Visit: Payer: Self-pay | Admitting: Critical Care Medicine

## 2021-01-29 ENCOUNTER — Other Ambulatory Visit: Payer: Self-pay

## 2021-01-29 DIAGNOSIS — E1165 Type 2 diabetes mellitus with hyperglycemia: Secondary | ICD-10-CM

## 2021-01-29 MED ORDER — INSULIN GLARGINE 100 UNITS/ML SOLOSTAR PEN
30.0000 [IU] | PEN_INJECTOR | Freq: Every day | SUBCUTANEOUS | 2 refills | Status: DC
Start: 2021-01-29 — End: 2021-04-01
  Filled 2021-01-29: qty 15, 50d supply, fill #0

## 2021-01-29 NOTE — Telephone Encounter (Signed)
I have reviewed Dr. Bettina Gavia previous letter which requested extension of FMLA till 01/21/2021 for better control of blood sugars which have significantly improved from 11.0 down to 8.1 (on 9/27) I do not understand the indication for this request for extension.

## 2021-01-30 ENCOUNTER — Other Ambulatory Visit: Payer: Self-pay

## 2021-01-30 NOTE — Telephone Encounter (Signed)
At 2:26 PM,  Lamar Sprinkles PEC was asked to relay message to patient.    Per [place] she was not trying to hear what dr Margarita Rana wrote and informed me what dr Joya Gaskins put in her chart. Please call her back   Attempt made to call patient back, no answer.

## 2021-01-30 NOTE — Telephone Encounter (Signed)
Left message on voicemail to return call.  __________________________  Will relay message per Dr. Margarita Rana and advise patient to f/u with PCP.

## 2021-01-31 NOTE — Telephone Encounter (Signed)
Patient came by the office to pick up letters in Dr. Joya Gaskins folder. Per patient these are incorrect. She stated she received these letters already and is needing work letter extension until her next appointment on November 8th. Per patient she was advised by Dr. Joya Gaskins that this could be done while he was away. Patient can be reached at [phone] to discuss further if needed.

## 2021-02-04 NOTE — Telephone Encounter (Signed)
Will forward to covering provider.

## 2021-02-04 NOTE — Telephone Encounter (Signed)
Suzanne Cline went to speak with Dr. Margarita Rana in regards to letter and per Dr. Margarita Rana she does not see any justifications to be out of work. Suzanne Cline reached out to Suzanne Cline and lvm making Suzanne Cline aware that letter will be on hold till Dr. Joya Gaskins return back on Monday and if she has any questions or concerns to give [place] a call

## 2021-02-05 ENCOUNTER — Other Ambulatory Visit: Payer: Self-pay

## 2021-02-05 ENCOUNTER — Ambulatory Visit (INDEPENDENT_AMBULATORY_CARE_PROVIDER_SITE_OTHER): Payer: Self-pay | Admitting: Obstetrics and Gynecology

## 2021-02-05 ENCOUNTER — Encounter: Payer: Self-pay | Admitting: Obstetrics and Gynecology

## 2021-02-05 VITALS — BP 141/100 | HR 70 | Wt 167.0 lb

## 2021-02-05 DIAGNOSIS — D219 Benign neoplasm of connective and other soft tissue, unspecified: Secondary | ICD-10-CM

## 2021-02-05 NOTE — Progress Notes (Signed)
Ms Casillas presents to schedule surgery. Has obtained pre op clearance form her PCP in regards to her DM. Pt was previously scheduled for TAH and bilateral salpingectomy but had to cancel due to uncontrolled DM.  Pt is ready to proceed with surgery.  PE AF  VSS Lungs clear  Heart RRR Abd soft + BS uterine fundus @ 18-20 weeks  A/P Enlarged uterus.  TAH with bilateral salpingectomy reviewed with pt R/B/Post op care discussed. Information provided. Will schedule and follow up with post op appt.

## 2021-02-05 NOTE — Patient Instructions (Signed)
Abdominal Hysterectomy Abdominal hysterectomy is a surgical procedure to remove the uterus. The uterus is an organ that holds the baby as it develops before birth. This surgery may be done if a woman has certain problems of the uterus. These may include cancer or growths (tumors or fibroids). Other problems include infection, chronic pain, severe bleeding, or other problems of the menstrual cycle. The procedure may also be done if: The uterus has slipped down into the vagina (uterine prolapse). The tissue that lines the uterus is growing outside of its normal location (endometriosis). Depending on the reason for this procedure, other reproductive organs may also be removed. These could include: The lowest part of the uterus (cervix), which opens into the vagina. The organs that make eggs (ovaries). The tubes that connect the ovaries to the uterus (fallopian tubes). Tell your health care provider about: Any allergies you have. All medicines you are taking, including vitamins, herbs, eye drops, creams, and over-the-counter medicines. Any problems you or family members have had with anesthetic medicines. Any blood disorders you have. Any surgeries you have had. Any medical conditions you have or have had. Whether you are pregnant or may be pregnant. What are the risks? Generally, this is a safe procedure. However, problems may occur, including: Bleeding. Infection. Allergic reactions to medicines or dyes. Damage to nearby structures or organs. Nerve injury. Decreased interest in sex or pain during sex. Blood clots that can break free and travel to your lungs. What happens before the procedure? Staying hydrated Follow instructions from your health care provider about hydration, which may include: Up to 2 hours before the procedure - you may continue to drink clear liquids, such as water, clear fruit juice, black coffee, and plain tea. Eating and drinking restrictions Follow instructions  from your health care provider about eating and drinking, which may include: 8 hours before the procedure - stop eating heavy meals or foods, such as meat, fried foods, or fatty foods. 6 hours before the procedure - stop eating light meals or foods, such as toast or cereal. 6 hours before the procedure - stop drinking milk or drinks that contain milk. 2 hours before the procedure - stop drinking clear liquids. Medicines Ask your health care provider about: Changing or stopping your regular medicines. This is especially important if you are taking diabetes medicines or blood thinners. Taking medicines such as aspirin and ibuprofen. These medicines can thin your blood. Do not take these medicines unless your health care provider tells you to take them. Taking over-the-counter medicines, vitamins, herbs, and supplements. You may be asked to take a medicine to empty your colon (bowel preparation). General instructions This procedure can affect the way you feel about yourself. Talk with your health care provider about the physical and emotional changes this procedure may cause. Do not use any products that contain nicotine or tobacco for at least 4 weeks before the procedure. These products include cigarettes, chewing tobacco, and vaping devices, such as e-cigarettes. If you need help quitting, ask your health care provider. Do not drink beverages that contain alcohol prior to the procedure. Alcohol can increase your risk of bleeding and complications. If you need help stopping, ask your health care provider. Plan to have a responsible adult take you home from the hospital or clinic. Ask your health care provider: How your surgery site will be marked. What steps will be taken to help prevent infection. These steps may include: Removing hair at the surgery site. Washing skin with a germ-killing  soap. Taking antibiotic medicine. What happens during the procedure? An IV will be inserted into one of  your veins. You will be given one or more of the following: A medicine to help you relax (sedative). A medicine to make you fall asleep (general anesthetic). A medicine that is injected into your spine to numb the area below and slightly above the injection site (spinal anesthetic). A medicine that is injected into an area of your body to numb everything below the injection site (regional anesthetic). Compression stockings will be placed on your legs to promote circulation. A thin, flexible tube (catheter) will be inserted to help drain your urine. An incision will be made through the skin in your lower abdomen. The incision may go side to side or up and down. Your uterus and any other organs that need to be removed will be carefully taken out. Bleeding will be controlled with clamps or stitches (sutures). Your incision will be closed with sutures, skin glue, or adhesive strips. A bandage (dressing) will be placed over the incision. The procedure may vary among health care providers and hospitals. What happens after the procedure? Your blood pressure, heart rate, breathing rate, and blood oxygen level will be monitored until you leave the hospital or clinic. You will be given pain medicine as needed. Ask your health care provider how long you will need to stay in the hospital after your procedure. You may have a liquid diet at first. You will most likely return to your usual diet the day after surgery. You will still have the urinary catheter in place. It will likely be removed the day after surgery. You may have to wear compression stockings. These stockings help to prevent blood clots and reduce swelling in your legs. You will be encouraged to walk as soon as possible. You will do breathing exercises or use a device to help keep your lungs clear. You may need to use a sanitary napkin for discharge from the vagina. Summary Abdominal hysterectomy is a surgical procedure to remove the uterus.  The uterus is the organ that holds a developing baby. This procedure can affect the way you feel about yourself. Talk with your health care provider about the physical and emotional changes this procedure may cause. You will be given pain medicine after the procedure. Ask your health care provider how long you will need to stay in the hospital after your procedure. This information is not intended to replace advice given to you by your health care provider. Make sure you discuss any questions you have with your health care provider. Document Revised: 12/14/2019 Document Reviewed: 12/14/2019 Elsevier Patient Education  [place]. Abdominal Hysterectomy, Care After The following information offers guidance on how to care for yourself after your procedure. Your health care provider may also give you more specific instructions. If you have problems or questions, contact your health care provider. What can I expect after the procedure? After the procedure, it is common to have: Pain. Tiredness (fatigue). Poor appetite. Less interest in sex. Vaginal bleeding and discharge. You may need to use a sanitary napkin after this procedure. Constipation. Feelings of sadness or other emotions. Follow these instructions at home: Medicines Take over-the-counter and prescription medicines only as told by your health care provider. If you were prescribed an antibiotic medicine, take it as told by your health care provider. Do not stop using the antibiotic even if you start to feel better. Ask your health care provider if the medicine prescribed to  you: Requires you to avoid driving or using machinery. Can cause constipation. You may need to take these actions to prevent or treat constipation: Drink enough fluid to keep your urine pale yellow. Take over-the-counter or prescription medicines. Eat foods that are high in fiber, such as beans, whole grains, and fresh fruits and vegetables. Limit foods  that are high in fat and processed sugars, such as fried or sweet foods. Incision care  Follow instructions from your health care provider about how to take care of your incision. Make sure you: Wash your hands with soap and water for at least 20 seconds before and after you change your bandage (dressing). If soap and water are not available, use hand sanitizer. Change your dressing as told by your health care provider. Leave stitches (sutures), skin glue, or adhesive strips in place. These skin closures may need to stay in place for 2 weeks or longer. If adhesive strip edges start to loosen and curl up, you may trim the loose edges. Do not remove adhesive strips completely unless your health care provider tells you to do that. Keep the dressing dry until your health care provider says it can be removed. Check your incision area every day for signs of infection. Check for: More redness, swelling, or pain. Fluid or blood. Warmth. Pus or a bad smell. Activity  Rest as told by your health care provider. Avoid sitting for a long time without moving. Get up to take short walks every 1-2 hours. This is important to improve blood flow and breathing. Ask for help if you feel weak or unsteady. Do not lift anything that is heavier than 10 lb (4.5 kg), or the limit that you are told, until your health care provider says that it is safe. Follow instructions from your health care provider about exercise, driving, and general activities. Return to your normal activities as told by your health care provider. Ask your health care provider what activities are safe for you. Lifestyle Do not douche, use tampons, or have sex for at least 6 weeks or as told by your health care provider. Do not drink alcohol until your health care provider approves. Do not use any products that contain nicotine or tobacco. These products include cigarettes, chewing tobacco, and vaping devices, such as e-cigarettes. These can delay  healing after surgery. If you need help quitting, ask your health care provider. General instructions If you struggle with physical or emotional changes after your procedure, speak with your health care provider or a therapist. Do not take baths, swim, or use a hot tub until your health care provider approves. Ask your health care provider if you may take showers. You may only be allowed to take sponge baths. Try to have a responsible adult at home with you for the first 1-2 weeks to help with your daily chores. Wear compression stockings as told by your health care provider. These stockings help to prevent blood clots and reduce swelling in your legs. Keep all follow-up visits. This is important. Contact a health care provider if: You have any of these signs of infection: Chills or a fever. More redness, swelling, warmth, or pain around your incision. Fluid or blood coming from your incision. Pus or a bad smell coming from your incision. Your incision opens. You feel dizzy or light-headed. You have pain or bleeding when you urinate. You have diarrhea that does not go away. You have nausea and vomiting that do not go away. You have pus or a  bad-smelling discharge coming from your vagina. You have any type of abnormal reaction such as a rash, or you develop an allergy to your medicine. Your pain medicine does not help. Get help right away if: You have a fever and your symptoms suddenly get worse. You have severe pain in your abdomen. You have shortness of breath. You faint. You have pain, swelling, or redness in your leg. You have heavy vaginal bleeding and blood clots, soaking through a sanitary napkin in less than 1 hour. These symptoms may represent a serious problem that is an emergency. Do not wait to see if the symptoms will go away. Get medical help right away. Call your local emergency services (911 in the [place] Do not drive yourself to the hospital. Summary After your  procedure, it is common to have pain, tiredness, and vaginal discharge. Do not lift anything that is heavier than 10 lb (4.5 kg), or the limit that you are told, until your health care provider says that it is safe. Follow instructions from your health care provider about exercise, driving, and general activities. Ask what activities are safe for you. Do not take baths, swim, or use a hot tub until your health care provider approves. Ask your health care provider if you may take showers. You may only be allowed to take sponge baths. Try to have a responsible adult at home with you for the first 1-2 weeks to help with your daily chores. This information is not intended to replace advice given to you by your health care provider. Make sure you discuss any questions you have with your health care provider. Document Revised: 05/28/2020 Document Reviewed: 12/14/2019 Elsevier Patient Education  2022 [employer].

## 2021-02-06 ENCOUNTER — Telehealth: Payer: Self-pay | Admitting: *Deleted

## 2021-02-06 NOTE — Telephone Encounter (Signed)
Call to patient regarding plans for surgery. Discussed date of 04-08-21 and patient states she was told surgery would be by end of the month. Reviewed dates with patient. Dr Rip Harbour is out of office next week. Will need to review schedule with him and call her back.

## 2021-02-12 ENCOUNTER — Other Ambulatory Visit: Payer: Self-pay

## 2021-02-25 NOTE — Progress Notes (Signed)
Surgical Instructions    Your procedure is scheduled on Tuesday, November 8th, 2022.   Report to [facility] Main Entrance "A" at 05:30 A.M., then check in with the Admitting office.  Call this number if you have problems the morning of surgery:  [phone]   If you have any questions prior to your surgery date call [phone]: Open Monday-Friday 8am-4pm    Remember:  Do not eat after midnight the night before your surgery  You may drink clear liquids until 04:30 the morning of your surgery.   Clear liquids allowed are: Water, Non-Citrus Juices (without pulp), Carbonated Beverages, Clear Tea, Black Coffee ONLY (NO MILK, CREAM OR POWDERED CREAMER of any kind), and Gatorade    Take these medicines the morning of surgery with A SIP OF WATER:  acyclovir (ZOVIRAX) amLODipine (NORVASC)  carvedilol (COREG)  gabapentin (NEURONTIN)  SYNTHROID  If needed:   hyoscyamine (LEVSIN)    WHAT DO I DO ABOUT MY DIABETES MEDICATION?   Do not take metFORMIN (GLUCOPHAGE XR) the morning of surgery.  Do not take Dulaglutide (TRULICITY) the day of surgery  Do not take FARXIGA the day before surgery and the day of surgery  THE NIGHT BEFORE SURGERY, take 15 units (50%) of insulin glargine (LANTUS)       HOW TO MANAGE YOUR DIABETES BEFORE AND AFTER SURGERY  Why is it important to control my blood sugar before and after surgery? Improving blood sugar levels before and after surgery helps healing and can limit problems. A way of improving blood sugar control is eating a healthy diet by:  Eating less sugar and carbohydrates  Increasing activity/exercise  Talking with your doctor about reaching your blood sugar goals High blood sugars (greater than 180 mg/dL) can raise your risk of infections and slow your recovery, so you will need to focus on controlling your diabetes during the weeks before surgery. Make sure that the doctor who takes care of your diabetes knows about your planned surgery  including the date and location.  How do I manage my blood sugar before surgery? Check your blood sugar at least 4 times a day, starting 2 days before surgery, to make sure that the level is not too high or low.  Check your blood sugar the morning of your surgery when you wake up and every 2 hours until you get to the Short Stay unit.  If your blood sugar is less than 70 mg/dL, you will need to treat for low blood sugar: Do not take insulin. Treat a low blood sugar (less than 70 mg/dL) with  cup of clear juice (cranberry or apple), 4 glucose tablets, OR glucose gel. Recheck blood sugar in 15 minutes after treatment (to make sure it is greater than 70 mg/dL). If your blood sugar is not greater than 70 mg/dL on recheck, call [phone] for further instructions. Report your blood sugar to the short stay nurse when you get to Short Stay.  If you are admitted to the hospital after surgery: Your blood sugar will be checked by the staff and you will probably be given insulin after surgery (instead of oral diabetes medicines) to make sure you have good blood sugar levels. The goal for blood sugar control after surgery is 80-180 mg/dL.   As of today, STOP taking any Aspirin (unless otherwise instructed by your surgeon) Aleve, Naproxen, Ibuprofen, Motrin, Advil, Goody's, BC's, all herbal medications, fish oil, and all vitamins.   After your COVID test   You are not required  to quarantine however you are required to wear a well-fitting mask when you are out and around people not in your household.  If your mask becomes wet or soiled, replace with a new one.  Wash your hands often with soap and water for 20 seconds or clean your hands with an alcohol-based hand sanitizer that contains at least 60% alcohol.  Do not share personal items.  Notify your provider: if you are in close contact with someone who has COVID  or if you develop a fever of 100.4 or greater, sneezing, cough, sore throat,  shortness of breath or body aches.    The day of surgery:          Do not wear jewelry or makeup Do not wear lotions, powders, perfumes, or deodorant. Do not shave 48 hours prior to surgery.   Do not bring valuables to the hospital. DO Not wear nail polish, gel polish, artificial nails, or any other type of covering on natural nails including finger and toenails. If patients have artificial nails, gel coating, etc. that need to be removed by a nail salon, please have this removed prior to surgery or surgery may need to be canceled/delayed if the surgeon/ anesthesia feels like the patient is unable to be adequately monitored.              [facility] is not responsible for any belongings or valuables.  Do NOT Smoke (Tobacco/Vaping)  24 hours prior to your procedure  If you use a CPAP at night, you may bring your mask for your overnight stay.   Contacts, glasses, hearing aids, dentures or partials may not be worn into surgery, please bring cases for these belongings   For patients admitted to the hospital, discharge time will be determined by your treatment team.   Patients discharged the day of surgery will not be allowed to drive home, and someone needs to stay with them for 24 hours.  NO VISITORS WILL BE ALLOWED IN PRE-OP WHERE PATIENTS ARE PREPPED FOR SURGERY.  ONLY 1 SUPPORT PERSON MAY BE PRESENT IN THE WAITING ROOM WHILE YOU ARE IN SURGERY.  IF YOU ARE TO BE ADMITTED, ONCE YOU ARE IN YOUR ROOM YOU WILL BE ALLOWED TWO (2) VISITORS. 1 (ONE) VISITOR MAY STAY OVERNIGHT BUT MUST ARRIVE TO THE ROOM BY 8pm.  Minor children may have two parents present. Special consideration for safety and communication needs will be reviewed on a case by case basis.  Special instructions:    Oral Hygiene is also important to reduce your risk of infection.  Remember - BRUSH YOUR TEETH THE MORNING OF SURGERY WITH YOUR REGULAR TOOTHPASTE   [facility]- Preparing For Surgery  Before surgery, you can play  an important role. Because skin is not sterile, your skin needs to be as free of germs as possible. You can reduce the number of germs on your skin by washing with CHG (chlorahexidine gluconate) Soap before surgery.  CHG is an antiseptic cleaner which kills germs and bonds with the skin to continue killing germs even after washing.     Please do not use if you have an allergy to CHG or antibacterial soaps. If your skin becomes reddened/irritated stop using the CHG.  Do not shave (including legs and underarms) for at least 48 hours prior to first CHG shower. It is OK to shave your face.  Please follow these instructions carefully.     Shower the NIGHT BEFORE SURGERY and the MORNING OF SURGERY with  CHG Soap.   If you chose to wash your hair, wash your hair first as usual with your normal shampoo. After you shampoo, rinse your hair and body thoroughly to remove the shampoo.  Then [employer] and genitals (private parts) with your normal soap and rinse thoroughly to remove soap.  After that Use CHG Soap as you would any other liquid soap. You can apply CHG directly to the skin and wash gently with a scrungie or a clean washcloth.   Apply the CHG Soap to your body ONLY FROM THE NECK DOWN.  Do not use on open wounds or open sores. Avoid contact with your eyes, ears, mouth and genitals (private parts). Wash Face and genitals (private parts)  with your normal soap.   Wash thoroughly, paying special attention to the area where your surgery will be performed.  Thoroughly rinse your body with warm water from the neck down.  DO NOT shower/wash with your normal soap after using and rinsing off the CHG Soap.  Pat yourself dry with a CLEAN TOWEL.  Wear CLEAN PAJAMAS to bed the night before surgery  Place CLEAN SHEETS on your bed the night before your surgery  DO NOT SLEEP WITH PETS.   Day of Surgery:  Take a shower with CHG soap. Wear Clean/Comfortable clothing the morning of surgery Do not apply  any deodorants/lotions.   Remember to brush your teeth WITH YOUR REGULAR TOOTHPASTE.   Please read over the following fact sheets that you were given.

## 2021-02-25 NOTE — H&P (Signed)
Suzanne Cline is an 55 y.o. female with known uterine fibroids. S/P [place] in 2016. Pt continues to have abd/pelvic discomfort related to her uterine fibroids.  She has received pre op clearance form her PCP in relationship to her DM. Last A1C 8.1 on 01/21/21   Menstrual History: Menarche age: 60 No LMP recorded. Patient is postmenopausal.    Past Medical History:  Diagnosis Date   CHF (congestive heart failure) ([place])    Diabetes mellitus without complication ([place])    Fibroids, intramural    s/p uterine artery embolization   Former cigarette smoker 04/2011   History of Resolved Nonischemic Congestive Cardiomyopathy ([place]) 04/2011   Echo March 2013: EF 35% with mild/moderate diastolic dysfunction -> Follow-Up Echo March 2014 EF 55% with G1 DD.   Hypertension    Hypertensive heart disease    Difficult control hypertension   Mild concentric left ventricular hypertrophy (LVH) 10/08/2019   Moderate mitral valve regurgitation 10/08/2019   Echo done June 2021; Dr. Ellyn Hack   Neuromuscular disorder [facility]    diabetic neuropathy   OSA (obstructive sleep apnea)    Sleep apnea    doesn't use cpap regularly   Thyroid disease    post thyroidectomy    Past Surgical History:  Procedure Laterality Date   ABLATION     uterus   COLONOSCOPY     2017 -  normal: in [place], [place] CATH AND CORONARY ANGIOGRAPHY  05/19/2011   ([facility]): Normal coronary arteries   THYROIDECTOMY     TRANSTHORACIC ECHOCARDIOGRAM  07/18/2011   [facility] [place]: EF 35% with mild to moderate DD. -->   TRANSTHORACIC ECHOCARDIOGRAM     [facility] [place]: EF 55%, G1 DD.  LVIDd 4.3; July 12, 2015: EF 55 to 60%.  Normal valves   UPPER GASTROINTESTINAL ENDOSCOPY  07/06/2019    Family History  Problem Relation Age of Onset   Congestive Heart Failure Mother    Breast cancer Neg Hx    Colon cancer Neg Hx    Esophageal cancer Neg Hx    Rectal cancer Neg Hx    Stomach cancer  Neg Hx    Inflammatory bowel disease Neg Hx    Liver disease Neg Hx    Pancreatic cancer Neg Hx     Social History:  reports that she quit smoking about 23 months ago. Her smoking use included cigars. She has never used smokeless tobacco. She reports current alcohol use. She reports current drug use. Drug: Marijuana.  Allergies:  Allergies  Allergen Reactions   Lisinopril Cough   Oxycodone Itching    No medications prior to admission.    Review of Systems  Constitutional: Negative.   Cardiovascular: Negative.   Gastrointestinal: Negative.   Genitourinary: Negative.    There were no vitals taken for this visit. Physical Exam Constitutional:      Appearance: Normal appearance.  Cardiovascular:     Rate and Rhythm: Normal rate and regular rhythm.  Pulmonary:     Effort: Pulmonary effort is normal.     Breath sounds: Normal breath sounds.  Abdominal:     General: Bowel sounds are normal.     Palpations: Abdomen is soft. There is mass.  Genitourinary:    Comments: Nl EGBUS, enlarged uterus @ 18-20 week size Neurological:     Mental Status: She is alert.    No results found for this or any previous visit (from the past 24 hour(s)).  No results found.  Assessment/Plan:  Uterine fibroids   Pt for TAH with possible salpingectomy. Pre op clearance obtained. R/B/Post op care have been reviewed and discussed. Pt has verbalized understanding and agrees to proceed.    [name] 02/25/2021, 11:01 AM

## 2021-02-26 ENCOUNTER — Other Ambulatory Visit: Payer: Self-pay

## 2021-02-26 ENCOUNTER — Encounter (HOSPITAL_COMMUNITY): Payer: Self-pay

## 2021-02-26 ENCOUNTER — Encounter (HOSPITAL_COMMUNITY)
Admission: RE | Admit: 2021-02-26 | Discharge: 2021-02-26 | Disposition: A | Discharge status: Home / Self Care | Payer: Self-pay | Source: Ambulatory Visit | Attending: Obstetrics and Gynecology | Admitting: Obstetrics and Gynecology

## 2021-02-26 DIAGNOSIS — I10 Essential (primary) hypertension: Secondary | ICD-10-CM | POA: Insufficient documentation

## 2021-02-26 DIAGNOSIS — D219 Benign neoplasm of connective and other soft tissue, unspecified: Secondary | ICD-10-CM

## 2021-02-26 DIAGNOSIS — E119 Type 2 diabetes mellitus without complications: Secondary | ICD-10-CM | POA: Insufficient documentation

## 2021-02-26 DIAGNOSIS — D259 Leiomyoma of uterus, unspecified: Secondary | ICD-10-CM | POA: Insufficient documentation

## 2021-02-26 DIAGNOSIS — Z01812 Encounter for preprocedural laboratory examination: Secondary | ICD-10-CM | POA: Insufficient documentation

## 2021-02-26 DIAGNOSIS — G4733 Obstructive sleep apnea (adult) (pediatric): Secondary | ICD-10-CM | POA: Insufficient documentation

## 2021-02-26 HISTORY — DX: Hypothyroidism, unspecified: E03.9

## 2021-02-26 HISTORY — DX: Family history of other specified conditions: Z84.89

## 2021-02-26 LAB — CBC
HCT: 42.1 % (ref 36.0–46.0)
Hemoglobin: 13 g/dL (ref 12.0–15.0)
MCH: 27.9 pg (ref 26.0–34.0)
MCHC: 30.9 g/dL (ref 30.0–36.0)
MCV: 90.3 fL (ref 80.0–100.0)
Platelets: 219 10*3/uL (ref 150–400)
RBC: 4.66 MIL/uL (ref 3.87–5.11)
RDW: 14.1 % (ref 11.5–15.5)
WBC: 6.1 10*3/uL (ref 4.0–10.5)
nRBC: 0 % (ref 0.0–0.2)

## 2021-02-26 LAB — BASIC METABOLIC PANEL
Anion gap: 5 (ref 5–15)
BUN: 17 mg/dL (ref 6–20)
CO2: 27 mmol/L (ref 22–32)
Calcium: 9.2 mg/dL (ref 8.9–10.3)
Chloride: 107 mmol/L (ref 98–111)
Creatinine, Ser: 1.3 mg/dL — ABNORMAL HIGH (ref 0.44–1.00)
GFR, Estimated: 48 mL/min — ABNORMAL LOW (ref >60)
Glucose, Bld: 77 mg/dL (ref 70–99)
Potassium: 4.3 mmol/L (ref 3.5–5.1)
Sodium: 139 mmol/L (ref 135–145)

## 2021-02-26 LAB — GLUCOSE, CAPILLARY: Glucose-Capillary: 76 mg/dL (ref 70–99)

## 2021-02-26 LAB — TYPE AND SCREEN
ABO/RH(D): A POS
Antibody Screen: NEGATIVE

## 2021-02-26 NOTE — Progress Notes (Signed)
PCP - Dr. Asencion Noble Cardiologist - Dr. Glenetta Hew has CHF  Chest x-ray - Not indicated EKG - 11/15/20 Stress Test - 2017 ECHO - June 2021 Cardiac Cath - denies  Sleep Study - Yes has OSA CPAP - intermittently uses  DM - Type II Blood Sugar - Ranges 90-130 Checks Blood Sugar __daily  COVID TEST- 02/28/21   Anesthesia review: Yes cardiac history  Patient denies shortness of breath, fever, cough and chest pain at PAT appointment   All instructions explained to the patient, with a verbal understanding of the material. Patient agrees to go over the instructions while at home for a better understanding. Patient also instructed to wear a mask while in public after being tested for COVID-19. The opportunity to ask questions was provided.

## 2021-02-26 NOTE — Progress Notes (Addendum)
Surgical Instructions                 Your procedure is scheduled on Tuesday, November 8th, 2022.               Report to [facility] Main Entrance "A" at 05:30 A.M., then check in with the Admitting office.             Call this number if you have problems the morning of surgery:             [phone]    If you have any questions prior to your surgery date call [phone]: Open Monday-Friday 8am-4pm                 Remember:             Do not eat pr drink anything after midnight the night before your surgery                            Take these medicines the morning of surgery with A SIP OF WATER:   acyclovir (ZOVIRAX) amLODipine (NORVASC)  carvedilol (COREG)  gabapentin (NEURONTIN)  SYNTHROID   If needed:    hyoscyamine (LEVSIN)      WHAT DO I DO ABOUT MY DIABETES MEDICATION?     Do not take metFORMIN (GLUCOPHAGE XR) the morning of surgery.   Do not take Dulaglutide (TRULICITY) the day of surgery   Do not take FARXIGA the day before surgery and the day of surgery   THE NIGHT BEFORE SURGERY, take 8 units (50%) of insulin glargine (LANTUS)                          HOW TO MANAGE YOUR DIABETES BEFORE AND AFTER SURGERY   Why is it important to control my blood sugar before and after surgery? Improving blood sugar levels before and after surgery helps healing and can limit problems. A way of improving blood sugar control is eating a healthy diet by:  Eating less sugar and carbohydrates  Increasing activity/exercise  Talking with your doctor about reaching your blood sugar goals High blood sugars (greater than 180 mg/dL) can raise your risk of infections and slow your recovery, so you will need to focus on controlling your diabetes during the weeks before surgery. Make sure that the doctor who takes care of your diabetes knows about your planned surgery including the date and location.   How do I manage my blood sugar before surgery? Check your blood sugar at  least 4 times a day, starting 2 days before surgery, to make sure that the level is not too high or low.   Check your blood sugar the morning of your surgery when you wake up and every 2 hours until you get to the Short Stay unit.   If your blood sugar is less than 70 mg/dL, you will need to treat for low blood sugar: Do not take insulin. Treat a low blood sugar (less than 70 mg/dL) with  cup of clear juice (cranberry or apple), 4 glucose tablets, OR glucose gel. Recheck blood sugar in 15 minutes after treatment (to make sure it is greater than 70 mg/dL). If your blood sugar is not greater than 70 mg/dL on recheck, call [phone] for further instructions. Report your blood sugar to the short stay nurse when you get to Short Stay.   If you are  admitted to the hospital after surgery: Your blood sugar will be checked by the staff and you will probably be given insulin after surgery (instead of oral diabetes medicines) to make sure you have good blood sugar levels. The goal for blood sugar control after surgery is 80-180 mg/dL.    As of today, STOP taking any Aspirin (unless otherwise instructed by your surgeon) Aleve, Naproxen, Ibuprofen, Motrin, Advil, Goody's, BC's, all herbal medications, fish oil, and all vitamins.     After your COVID test    You are not required to quarantine however you are required to wear a well-fitting mask when you are out and around people not in your household.  If your mask becomes wet or soiled, replace with a new one.   Wash your hands often with soap and water for 20 seconds or clean your hands with an alcohol-based hand sanitizer that contains at least 60% alcohol.   Do not share personal items.   Notify your provider: if you are in close contact with someone who has COVID  or if you develop a fever of 100.4 or greater, sneezing, cough, sore throat, shortness of breath or body aches.                 The day of surgery:          Do not wear jewelry  or makeup Do not wear lotions, powders, perfumes, or deodorant. Do not shave 48 hours prior to surgery.   Do not bring valuables to the hospital. DO Not wear nail polish, gel polish, artificial nails, or any other type of covering on natural nails including finger and toenails. If patients have artificial nails, gel coating, etc. that need to be removed by a nail salon, please have this removed prior to surgery or surgery may need to be canceled/delayed if the surgeon/ anesthesia feels like the patient is unable to be adequately monitored.                [facility] is not responsible for any belongings or valuables.   Do NOT Smoke (Tobacco/Vaping)  24 hours prior to your procedure   If you use a CPAP at night, you may bring your mask for your overnight stay.   Contacts, glasses, hearing aids, dentures or partials may not be worn into surgery, please bring cases for these belongings   For patients admitted to the hospital, discharge time will be determined by your treatment team.   Patients discharged the day of surgery will not be allowed to drive home, and someone needs to stay with them for 24 hours.   NO VISITORS WILL BE ALLOWED IN PRE-OP WHERE PATIENTS ARE PREPPED FOR SURGERY.  ONLY 1 SUPPORT PERSON MAY BE PRESENT IN THE WAITING ROOM WHILE YOU ARE IN SURGERY.  IF YOU ARE TO BE ADMITTED, ONCE YOU ARE IN YOUR ROOM YOU WILL BE ALLOWED TWO (2) VISITORS. 1 (ONE) VISITOR MAY STAY OVERNIGHT BUT MUST ARRIVE TO THE ROOM BY 8pm.  Minor children may have two parents present. Special consideration for safety and communication needs will be reviewed on a case by case basis.   Special instructions:     Oral Hygiene is also important to reduce your risk of infection.  Remember - BRUSH YOUR TEETH THE MORNING OF SURGERY WITH YOUR REGULAR TOOTHPASTE     [place]- Preparing For Surgery   Before surgery, you can play an important role. Because skin is not sterile, your skin needs to  be as free of  germs as possible. You can reduce the number of germs on your skin by washing with CHG (chlorahexidine gluconate) Soap before surgery.  CHG is an antiseptic cleaner which kills germs and bonds with the skin to continue killing germs even after washing.       Please do not use if you have an allergy to CHG or antibacterial soaps. If your skin becomes reddened/irritated stop using the CHG.  Do not shave (including legs and underarms) for at least 48 hours prior to first CHG shower. It is OK to shave your face.   Please follow these instructions carefully.                                                                                                                                  Shower the NIGHT BEFORE SURGERY and the MORNING OF SURGERY with CHG Soap.  If you chose to wash your hair, wash your hair first as usual with your normal shampoo. After you shampoo, rinse your hair and body thoroughly to remove the shampoo.  Then [employer] and genitals (private parts) with your normal soap and rinse thoroughly to remove soap.   After that Use CHG Soap as you would any other liquid soap. You can apply CHG directly to the skin and wash gently with a scrungie or a clean washcloth.    Apply the CHG Soap to your body ONLY FROM THE NECK DOWN.  Do not use on open wounds or open sores. Avoid contact with your eyes, ears, mouth and genitals (private parts). Wash Face and genitals (private parts)  with your normal soap.    Wash thoroughly, paying special attention to the area where your surgery will be performed.   Thoroughly rinse your body with warm water from the neck down.   DO NOT shower/wash with your normal soap after using and rinsing off the CHG Soap.   Pat yourself dry with a CLEAN TOWEL.   Wear CLEAN PAJAMAS to bed the night before surgery   Place CLEAN SHEETS on your bed the night before your surgery   DO NOT SLEEP WITH PETS.     Day of Surgery:   Take a shower with CHG soap. Wear  Clean/Comfortable clothing the morning of surgery Do not apply any deodorants/lotions.   Remember to brush your teeth WITH YOUR REGULAR TOOTHPASTE.   Please read over the following fact sheets that you were given.                 Note Details  Author Alphonsus Sias, RN File Time 02/25/2021  8:51 AM  Author Type Registered Nurse Status Signed  Last Editor Alphonsus Sias, RN Service Nursing

## 2021-02-27 NOTE — Progress Notes (Signed)
Anesthesia Chart Review:  Patient previously followed by cardiology for history of nonischemic cardiomyopathy in 2013 (with resolution by follow-up echo in 2017), hypertensive heart disease.  In May 2021 patient was referred back to Dr. Ellyn Hack by her PCP for preop evaluation (evaluation was for the same surgery she is having now, surgery has been delayed several times, most recently due to markedly uncontrolled IDDM 2 with A1c 12.4).  Per original cardiac clearance note by Dr. Ellyn Hack, "As for preop evaluation, if a lot of this depends on her echocardiogram.  She has no active anginal symptoms and no true heart failure symptoms. She has diabetes but not on insulin.  Uterine fibroid resection would only be high risk if intra-abdominal surgery performed.  Regardless, she is on max dose carvedilol and I would not recommend any further cardiac valuation beyond 2D echo. Provided the echo does not show any significant reduced EF or wall motion changes, would not require any further evaluation or treatment."  Echo 10/05/2019 showed normal LVEF 66 5%, mild increased wall thickness with impaired relaxation related to high blood pressure.  It also showed moderate to severe mitral regurgitation, which was new.  Dr. Ellyn Hack commented and stated that she would probably need recheck cardiac echocardiograms every so often.   Patient's chronic medical conditions are being followed by PCP Dr. Asencion Noble.  Dr. Joya Gaskins has been working with patient to get her optimized for surgery.  She was last seen 01/21/2021.  Per note, " Type 2 diabetes currently now at goal hemoglobin A1c of 8.1 I believe at this point she can be cleared medically for planned surgical intervention. We need to get the fibroids out that so this patient can return to work. No change in current medication program."  Hx of OSA on CPAP.   Preop labs reviewed, creatinine mildly elevated at 1.30, otherwise unremarkable.   EKG 11/15/20: Sinus rhythm with  Premature atrial complexes. Rate 85. Possible Left atrial enlargement. Left anterior fascicular block. Left ventricular hypertrophy ( R in aVL , Cornell product ). Cannot rule out Septal infarct , age undetermined. No significant change since 2020   TTE 10/05/19:  1. Left ventricular ejection fraction, by estimation, is 60 to 65%. The  left ventricle has normal function. The left ventricle has no regional  wall motion abnormalities. There is mild left ventricular hypertrophy.  Left ventricular diastolic parameters  are consistent with Grade I diastolic dysfunction (impaired relaxation).   2. Right ventricular systolic function is normal. The right ventricular  size is normal. There is normal pulmonary artery systolic pressure.   3. The mitral valve is grossly normal. Moderate to severe mitral valve  regurgitation. No evidence of mitral stenosis.   4. The aortic valve is normal in structure. Aortic valve regurgitation is  not visualized. No aortic stenosis is present.   Wynonia Musty [facility] Short Stay Center/Anesthesiology Phone [phone] 02/27/2021 2:17 PM

## 2021-02-27 NOTE — Anesthesia Preprocedure Evaluation (Addendum)
Anesthesia Evaluation  Patient identified by MRN, date of birth, ID band Patient awake    Reviewed: Allergy & Precautions, NPO status , Patient's Chart, lab work & pertinent test results  Airway Mallampati: III  TM Distance: >3 FB Neck ROM: Full    Dental  (+) Teeth Intact, Dental Advisory Given, Missing   Pulmonary sleep apnea (occasionally uses CPAP) , Patient abstained from smoking., former smoker,    Pulmonary exam normal breath sounds clear to auscultation       Cardiovascular hypertension (147/93 in preop), Pt. on medications +CHF (grade 1 diastolic dysfunction)  Normal cardiovascular exam+ Valvular Problems/Murmurs (mod-severe MR) MR  Rhythm:Regular Rate:Normal  previously followed by cardiology for history of nonischemic cardiomyopathy in 2013 (with resolution by follow-up echo in 2017), hypertensive heart disease.  In May 2021 patient was referred back to Dr. Ellyn Hack by her PCP for preop evaluation (evaluation was for the same surgery she is having now, surgery has been delayed several times, most recently due to markedly uncontrolled IDDM 2 with A1c 12.4).  Per original cardiac clearance note by Dr. Ellyn Hack, "As for preop evaluation, if a lot of this depends on her echocardiogram.  She has no active anginal symptoms and no true heart failure symptoms. She has diabetes but not on insulin.  Uterine fibroid resection would only be high risk if intra-abdominal surgery performed.  Regardless, she is on max dose carvedilol and I would not recommend any further cardiac valuation beyond 2D echo. Provided the echo does not show any significant reduced EF or wall motion changes, would not require any further evaluation or treatment."  Echo 10/05/2019 showed normal LVEF 66 5%, mild increased wall thickness with impaired relaxation related to high blood pressure.  It also showed moderate to severe mitral regurgitation, which was new.  Dr. Ellyn Hack  commented and stated that she would probably need recheck cardiac echocardiograms every so often.   TTE 10/05/19: 1. Left ventricular ejection fraction, by estimation, is 60 to 65%. The  left ventricle has normal function. The left ventricle has no regional  wall motion abnormalities. There is mild left ventricular hypertrophy.  Left ventricular diastolic parameters  are consistent with Grade I diastolic dysfunction (impaired relaxation).  2. Right ventricular systolic function is normal. The right ventricular  size is normal. There is normal pulmonary artery systolic pressure.  3. The mitral valve is grossly normal. Moderate to severe mitral valve  regurgitation. No evidence of mitral stenosis.  4. The aortic valve is normal in structure. Aortic valve regurgitation is  not visualized. No aortic stenosis is present.   Neuro/Psych  Neuromuscular disease (peripheral neuropathy 2/2 T2DM) negative psych ROS   GI/Hepatic GERD  Controlled,(+)     substance abuse  alcohol use and marijuana use,   Endo/Other  diabetes, Poorly Controlled, Type 2, Insulin Dependent, Oral Hypoglycemic AgentsHypothyroidism a1c down to 8.1 from 12 FS 121   Renal/GU Renal InsufficiencyRenal diseaseCr 1.3  Female GU complaint (uterine fibroids)     Musculoskeletal negative musculoskeletal ROS (+)   Abdominal (+) + obese,   Peds  Hematology negative hematology ROS (+) hct 42.1, plt 219   Anesthesia Other Findings   Reproductive/Obstetrics negative OB ROS                          Anesthesia Physical Anesthesia Plan  ASA: 4  Anesthesia Plan: General and Regional   Post-op Pain Management: GA combined w/ Regional for post-op pain   Induction:  Intravenous  PONV Risk Score and Plan: 4 or greater and Ondansetron, Dexamethasone, Midazolam and Treatment may vary due to age or medical condition  Airway Management Planned: Oral ETT  Additional Equipment: Arterial  line  Intra-op Plan:   Post-operative Plan: Extubation in OR  Informed Consent: I have reviewed the patients History and Physical, chart, labs and discussed the procedure including the risks, benefits and alternatives for the proposed anesthesia with the patient or authorized representative who has indicated his/her understanding and acceptance.     Dental advisory given  Plan Discussed with: CRNA  Anesthesia Plan Comments: (D/w pt she is at high risk of cardiac complications given severe valvular disease)     Anesthesia Quick Evaluation

## 2021-02-28 ENCOUNTER — Other Ambulatory Visit (HOSPITAL_COMMUNITY)
Admission: RE | Admit: 2021-02-28 | Discharge: 2021-02-28 | Disposition: A | Discharge status: Home / Self Care | Payer: Self-pay | Source: Ambulatory Visit | Attending: Obstetrics and Gynecology | Admitting: Obstetrics and Gynecology

## 2021-02-28 DIAGNOSIS — Z01812 Encounter for preprocedural laboratory examination: Secondary | ICD-10-CM | POA: Insufficient documentation

## 2021-02-28 DIAGNOSIS — Z01818 Encounter for other preprocedural examination: Secondary | ICD-10-CM

## 2021-02-28 DIAGNOSIS — Z20822 Contact with and (suspected) exposure to covid-19: Secondary | ICD-10-CM | POA: Insufficient documentation

## 2021-02-28 LAB — SARS CORONAVIRUS 2 (TAT 6-24 HRS): SARS Coronavirus 2: NEGATIVE

## 2021-03-03 NOTE — Progress Notes (Incomplete)
Established Patient Office Visit  Subjective:  Patient ID: Suzanne Cline, female    DOB: 08-08-1965  Age: 55 y.o. MRN: [id number]  CC: No chief complaint on file.   HPI Chrisie Jankovich presents for *** Essential hypertension (Chronic)       Blood pressure at goal we will continue current medications            Endocrine    Type 2 diabetes mellitus without complication, with long-term current use of insulin (HCC) - Primary (Chronic)      Type 2 diabetes currently now at goal hemoglobin A1c of 8.1 I believe at this point she can be cleared medically for planned surgical intervention   We need to get the fibroids out that so this patient can return to work   No change in current medication program        Relevant Orders    HgB A1c (Completed)        Other    Fibroids      Plan surgical intervention per gynecology     Past Medical History:  Diagnosis Date   CHF (congestive heart failure) ([place])    Diabetes mellitus without complication ([place])    Family history of adverse reaction to anesthesia    Fibroids, intramural    s/p uterine artery embolization   Former cigarette smoker 04/2011   History of Resolved Nonischemic Congestive Cardiomyopathy ([place]) 04/2011   Echo March 2013: EF 35% with mild/moderate diastolic dysfunction -> Follow-Up Echo March 2014 EF 55% with G1 DD.   Hypertension    Hypertensive heart disease    Difficult control hypertension   Hypothyroidism    Mild concentric left ventricular hypertrophy (LVH) 10/08/2019   Moderate mitral valve regurgitation 10/08/2019   Echo done June 2021; Dr. Ellyn Hack   Neuromuscular disorder [facility])    diabetic neuropathy   OSA (obstructive sleep apnea)    Sleep apnea    doesn't use cpap regularly   Thyroid disease    post thyroidectomy    Past Surgical History:  Procedure Laterality Date   ABLATION     uterus   COLONOSCOPY     2017 -  normal: in [place], [place]     for  fibroids   DILATION AND CURETTAGE OF UTERUS     LEFT HEART CATH AND CORONARY ANGIOGRAPHY  05/19/2011   ([facility]): Normal coronary arteries   THYROIDECTOMY     TRANSTHORACIC ECHOCARDIOGRAM  07/18/2011   [facility] [place]: EF 35% with mild to moderate DD. -->   TRANSTHORACIC ECHOCARDIOGRAM     [facility] [place]: EF 55%, G1 DD.  LVIDd 4.3; July 12, 2015: EF 55 to 60%.  Normal valves   TUBAL LIGATION     UPPER GASTROINTESTINAL ENDOSCOPY  07/06/2019    Family History  Problem Relation Age of Onset   Congestive Heart Failure Mother    Breast cancer Neg Hx    Colon cancer Neg Hx    Esophageal cancer Neg Hx    Rectal cancer Neg Hx    Stomach cancer Neg Hx    Inflammatory bowel disease Neg Hx    Liver disease Neg Hx    Pancreatic cancer Neg Hx     Social History   Socioeconomic History   Marital status: Single    Spouse name: Not on file   Number of children: 2   Years of education: Not on file   Highest education level: Not on file  Occupational  History   Not on file  Tobacco Use   Smoking status: Former    Types: Cigars    Quit date: 03/2019    Years since quitting: 1.9   Smokeless tobacco: Never   Tobacco comments:    smokes occasional cigar  Vaping Use   Vaping Use: Every day   Devices: hemp  Substance and Sexual Activity   Alcohol use: Yes    Comment: social   Drug use: Yes    Types: Marijuana   Sexual activity: Not Currently  Other Topics Concern   Not on file  Social History Narrative   Not on file   Social Determinants of Health   Financial Resource Strain: Not on file  Food Insecurity: Not on file  Transportation Needs: Not on file  Physical Activity: Not on file  Stress: Not on file  Social Connections: Not on file  Intimate Partner Violence: Not on file    Outpatient Medications Prior to Visit  Medication Sig Dispense Refill   acyclovir (ZOVIRAX) 400 MG tablet Take 1 tablet  (400 mg total) by mouth 5 (five) times daily. (Patient taking differently: Take 400 mg by mouth 5 (five) times daily. Prn for flare up) 60 tablet 1   amLODipine (NORVASC) 10 MG tablet Take 1 tablet (10 mg total) by mouth daily. 90 tablet 1   atorvastatin (LIPITOR) 40 MG tablet Take 1 tablet (40 mg total) by mouth daily. (Patient not taking: No sig reported) 90 tablet 1   Blood Glucose Monitoring Suppl (TRUE METRIX METER) w/Device KIT Use as directed 1 kit 0   carvedilol (COREG) 25 MG tablet Take 1 tablet (25 mg total) by mouth 2 (two) times daily with a meal. 180 tablet 0   Dulaglutide (TRULICITY) 1.5 LP/3.7TK SOPN Inject 1.5 mg into the skin once a week. 2 mL 2   FARXIGA 10 MG TABS tablet Take 1 tablet (10 mg total) by mouth every morning. 30 tablet 2   furosemide (LASIX) 20 MG tablet Take 1 tablet (20 mg total) by mouth 2 (two) times daily as needed. (Patient taking differently: Take 20 mg by mouth 2 (two) times daily as needed for fluid.) 60 tablet 2   gabapentin (NEURONTIN) 300 MG capsule TAKE 2 CAPSULES (600 MG TOTAL) BY MOUTH 2 (TWO) TIMES DAILY. (Patient taking differently: Take 300-600 mg by mouth 2 (two) times daily as needed (pain).) 180 capsule 1   glucose blood (TRUE METRIX BLOOD GLUCOSE TEST) test strip Use as instructed 100 each 12   hyoscyamine (LEVSIN) 0.125 MG tablet Take 1 tablet (0.125 mg total) by mouth every 4 (four) hours as needed. (Patient taking differently: Take 0.125 mg by mouth every 4 (four) hours as needed for cramping.) 30 tablet 2   insulin glargine (LANTUS) 100 unit/mL SOPN Inject 30 Units into the skin at bedtime. (Patient taking differently: Inject 16-20 Units into the skin at bedtime.) 6 mL 2   metFORMIN (GLUCOPHAGE XR) 500 MG 24 hr tablet Take 2 tablets (1,000 mg total) by mouth daily with breakfast. 180 tablet 1   spironolactone (ALDACTONE) 25 MG tablet Take 1 tablet (25 mg total) by mouth daily. 30 tablet 2   SYNTHROID 150 MCG tablet Take 1 tablet  (150 mcg total) by mouth daily. 90 tablet 0   TRUEplus Lancets 28G MISC Use as directed 100 each 4   valsartan (DIOVAN) 320 MG tablet Take 1 tablet (320 mg total) by mouth daily. 90 tablet 3   No facility-administered medications prior to  visit.    Allergies  Allergen Reactions   Lisinopril Cough   Oxycodone Itching    ROS Review of Systems    Objective:    Physical Exam  There were no vitals taken for this visit. Wt Readings from Last 3 Encounters:  02/26/21 169 lb 1.6 oz (76.7 kg)  02/05/21 167 lb (75.8 kg)  01/21/21 164 lb 9.6 oz (74.7 kg)     Health Maintenance Due  Topic Date Due   COVID-19 Vaccine (1) Never done   MAMMOGRAM  Never done    There are no preventive care reminders to display for this patient.  Lab Results  Component Value Date   TSH 3.590 11/14/2019   Lab Results  Component Value Date   WBC 6.1 02/26/2021   HGB 13.0 02/26/2021   HCT 42.1 02/26/2021   MCV 90.3 02/26/2021   PLT 219 02/26/2021   Lab Results  Component Value Date   NA 139 02/26/2021   K 4.3 02/26/2021   CO2 27 02/26/2021   GLUCOSE 77 02/26/2021   BUN 17 02/26/2021   CREATININE 1.30 (H) 02/26/2021   BILITOT 0.3 09/01/2019   ALKPHOS 70 09/01/2019   AST 12 09/01/2019   ALT 9 09/01/2019   PROT 7.1 09/01/2019   ALBUMIN 4.6 09/01/2019   CALCIUM 9.2 02/26/2021   ANIONGAP 5 02/26/2021   No results found for: CHOL No results found for: HDL No results found for: LDLCALC No results found for: TRIG No results found for: CHOLHDL Lab Results  Component Value Date   HGBA1C 8.1 (A) 01/21/2021      Assessment & Plan:   Problem List Items Addressed This Visit   None   No orders of the defined types were placed in this encounter.   Follow-up: No follow-ups on file.    Asencion Noble, MD

## 2021-03-04 ENCOUNTER — Encounter (HOSPITAL_COMMUNITY): Payer: Self-pay | Admitting: Obstetrics and Gynecology

## 2021-03-04 ENCOUNTER — Inpatient Hospital Stay (HOSPITAL_COMMUNITY)
Admission: RE | Admit: 2021-03-04 | Discharge: 2021-03-06 | DRG: 742 | Disposition: A | Discharge status: Home / Self Care | Payer: Self-pay | Attending: Obstetrics and Gynecology | Admitting: Obstetrics and Gynecology

## 2021-03-04 ENCOUNTER — Ambulatory Visit: Payer: Self-pay | Admitting: Critical Care Medicine

## 2021-03-04 ENCOUNTER — Inpatient Hospital Stay (HOSPITAL_COMMUNITY): Payer: Self-pay | Admitting: Anesthesiology

## 2021-03-04 ENCOUNTER — Encounter (HOSPITAL_COMMUNITY)
Admission: RE | Disposition: A | Discharge status: Home / Self Care | Payer: Self-pay | Source: Home / Self Care | Attending: Obstetrics and Gynecology

## 2021-03-04 ENCOUNTER — Inpatient Hospital Stay (HOSPITAL_COMMUNITY): Payer: Self-pay | Admitting: Physician Assistant

## 2021-03-04 ENCOUNTER — Other Ambulatory Visit: Payer: Self-pay

## 2021-03-04 DIAGNOSIS — Z9889 Other specified postprocedural states: Secondary | ICD-10-CM

## 2021-03-04 DIAGNOSIS — I340 Nonrheumatic mitral (valve) insufficiency: Secondary | ICD-10-CM | POA: Diagnosis present

## 2021-03-04 DIAGNOSIS — E785 Hyperlipidemia, unspecified: Secondary | ICD-10-CM | POA: Diagnosis present

## 2021-03-04 DIAGNOSIS — D219 Benign neoplasm of connective and other soft tissue, unspecified: Secondary | ICD-10-CM

## 2021-03-04 DIAGNOSIS — I420 Dilated cardiomyopathy: Secondary | ICD-10-CM | POA: Diagnosis present

## 2021-03-04 DIAGNOSIS — Z8249 Family history of ischemic heart disease and other diseases of the circulatory system: Secondary | ICD-10-CM

## 2021-03-04 DIAGNOSIS — Z87891 Personal history of nicotine dependence: Secondary | ICD-10-CM

## 2021-03-04 DIAGNOSIS — Z793 Long term (current) use of hormonal contraceptives: Secondary | ICD-10-CM

## 2021-03-04 DIAGNOSIS — I110 Hypertensive heart disease with heart failure: Secondary | ICD-10-CM | POA: Diagnosis present

## 2021-03-04 DIAGNOSIS — Z794 Long term (current) use of insulin: Secondary | ICD-10-CM

## 2021-03-04 DIAGNOSIS — G4733 Obstructive sleep apnea (adult) (pediatric): Secondary | ICD-10-CM | POA: Diagnosis present

## 2021-03-04 DIAGNOSIS — E890 Postprocedural hypothyroidism: Secondary | ICD-10-CM | POA: Diagnosis present

## 2021-03-04 DIAGNOSIS — K219 Gastro-esophageal reflux disease without esophagitis: Secondary | ICD-10-CM | POA: Diagnosis present

## 2021-03-04 DIAGNOSIS — N920 Excessive and frequent menstruation with regular cycle: Secondary | ICD-10-CM | POA: Diagnosis present

## 2021-03-04 DIAGNOSIS — D259 Leiomyoma of uterus, unspecified: Principal | ICD-10-CM | POA: Diagnosis present

## 2021-03-04 DIAGNOSIS — Z79899 Other long term (current) drug therapy: Secondary | ICD-10-CM

## 2021-03-04 DIAGNOSIS — E1140 Type 2 diabetes mellitus with diabetic neuropathy, unspecified: Secondary | ICD-10-CM | POA: Diagnosis present

## 2021-03-04 DIAGNOSIS — Z7984 Long term (current) use of oral hypoglycemic drugs: Secondary | ICD-10-CM

## 2021-03-04 HISTORY — PX: HYSTERECTOMY ABDOMINAL WITH SALPINGECTOMY: SHX6725

## 2021-03-04 HISTORY — DX: Failed or difficult intubation, initial encounter: T88.4XXA

## 2021-03-04 LAB — GLUCOSE, CAPILLARY
Glucose-Capillary: 121 mg/dL — ABNORMAL HIGH (ref 70–99)
Glucose-Capillary: 161 mg/dL — ABNORMAL HIGH (ref 70–99)
Glucose-Capillary: 158 mg/dL — ABNORMAL HIGH (ref 70–99)
Glucose-Capillary: 177 mg/dL — ABNORMAL HIGH (ref 70–99)
Glucose-Capillary: 161 mg/dL — ABNORMAL HIGH (ref 70–99)

## 2021-03-04 SURGERY — HYSTERECTOMY, TOTAL, ABDOMINAL, WITH SALPINGECTOMY
Anesthesia: Regional | Site: Abdomen | Laterality: Bilateral

## 2021-03-04 MED ORDER — MIDAZOLAM HCL 2 MG/2ML IJ SOLN
INTRAMUSCULAR | Status: AC
  Filled 2021-03-04: qty 2

## 2021-03-04 MED ORDER — KETOROLAC TROMETHAMINE 15 MG/ML IJ SOLN
15.0000 mg | INTRAMUSCULAR | Status: DC
  Filled 2021-03-04: qty 1

## 2021-03-04 MED ORDER — FENTANYL CITRATE (PF) 100 MCG/2ML IJ SOLN
INTRAMUSCULAR | Status: DC | PRN
  Administered 2021-03-04 (×4): 50 ug via INTRAVENOUS

## 2021-03-04 MED ORDER — METFORMIN HCL ER 500 MG PO TB24
1000.0000 mg | ORAL_TABLET | Freq: Every day | ORAL | Status: DC
  Administered 2021-03-05 – 2021-03-06 (×2): 1000 mg via ORAL
  Filled 2021-03-04 (×2): qty 2

## 2021-03-04 MED ORDER — PROMETHAZINE HCL 25 MG/ML IJ SOLN: 6.2500 mg | INTRAMUSCULAR | Status: DC | PRN

## 2021-03-04 MED ORDER — HYDROCODONE-ACETAMINOPHEN 7.5-325 MG PO TABS: 1.0000 | ORAL_TABLET | Freq: Once | ORAL | Status: DC | PRN

## 2021-03-04 MED ORDER — LACTATED RINGERS IV SOLN: INTRAVENOUS | Status: DC

## 2021-03-04 MED ORDER — ACETAMINOPHEN 500 MG PO TABS
1000.0000 mg | ORAL_TABLET | ORAL | Status: AC
  Administered 2021-03-04: 1000 mg via ORAL

## 2021-03-04 MED ORDER — IRBESARTAN 300 MG PO TABS
300.0000 mg | ORAL_TABLET | Freq: Every day | ORAL | Status: DC
  Administered 2021-03-04 – 2021-03-06 (×3): 300 mg via ORAL
  Filled 2021-03-04 (×3): qty 1

## 2021-03-04 MED ORDER — ROCURONIUM BROMIDE 10 MG/ML (PF) SYRINGE
PREFILLED_SYRINGE | INTRAVENOUS | Status: AC
  Filled 2021-03-04: qty 10

## 2021-03-04 MED ORDER — ONDANSETRON HCL 4 MG/2ML IJ SOLN: 4.0000 mg | Freq: Four times a day (QID) | INTRAMUSCULAR | Status: DC | PRN

## 2021-03-04 MED ORDER — POVIDONE-IODINE 10 % EX SWAB
2.0000 "application " | Freq: Once | CUTANEOUS | Status: AC
  Administered 2021-03-04: 2 via TOPICAL

## 2021-03-04 MED ORDER — CEFAZOLIN SODIUM-DEXTROSE 2-4 GM/100ML-% IV SOLN
2.0000 g | INTRAVENOUS | Status: AC
  Administered 2021-03-04: 2 g via INTRAVENOUS
  Filled 2021-03-04: qty 100

## 2021-03-04 MED ORDER — DEXAMETHASONE SODIUM PHOSPHATE 10 MG/ML IJ SOLN
INTRAMUSCULAR | Status: AC
  Filled 2021-03-04: qty 1

## 2021-03-04 MED ORDER — IBUPROFEN 600 MG PO TABS
600.0000 mg | ORAL_TABLET | Freq: Four times a day (QID) | ORAL | Status: DC | PRN
  Administered 2021-03-06: 600 mg via ORAL
  Filled 2021-03-04 (×2): qty 1

## 2021-03-04 MED ORDER — SOD CITRATE-CITRIC ACID 500-334 MG/5ML PO SOLN
30.0000 mL | ORAL | Status: AC
  Administered 2021-03-04: 30 mL via ORAL
  Filled 2021-03-04: qty 30

## 2021-03-04 MED ORDER — SUGAMMADEX SODIUM 200 MG/2ML IV SOLN
INTRAVENOUS | Status: DC | PRN
Start: 2021-03-04 — End: 2021-03-04
  Administered 2021-03-04: 100 mg via INTRAVENOUS
  Administered 2021-03-04: 200 mg via INTRAVENOUS

## 2021-03-04 MED ORDER — INSULIN GLARGINE-YFGN 100 UNIT/ML ~~LOC~~ SOLN
20.0000 [IU] | Freq: Every day | SUBCUTANEOUS | Status: DC
  Administered 2021-03-04 – 2021-03-05 (×2): 20 [IU] via SUBCUTANEOUS
  Filled 2021-03-04 (×4): qty 0.2

## 2021-03-04 MED ORDER — DEXAMETHASONE SODIUM PHOSPHATE 10 MG/ML IJ SOLN
INTRAMUSCULAR | Status: DC | PRN
  Administered 2021-03-04: 10 mg via INTRAVENOUS

## 2021-03-04 MED ORDER — LEVOTHYROXINE SODIUM 75 MCG PO TABS
150.0000 ug | ORAL_TABLET | Freq: Every day | ORAL | Status: DC
  Administered 2021-03-05 – 2021-03-06 (×2): 150 ug via ORAL
  Filled 2021-03-04 (×2): qty 2

## 2021-03-04 MED ORDER — FENTANYL CITRATE (PF) 250 MCG/5ML IJ SOLN
INTRAMUSCULAR | Status: AC
  Filled 2021-03-04: qty 5

## 2021-03-04 MED ORDER — CARVEDILOL 25 MG PO TABS
25.0000 mg | ORAL_TABLET | Freq: Two times a day (BID) | ORAL | Status: DC
  Administered 2021-03-04 – 2021-03-06 (×4): 25 mg via ORAL
  Filled 2021-03-04 (×4): qty 1

## 2021-03-04 MED ORDER — BUPIVACAINE HCL (PF) 0.5 % IJ SOLN
INTRAMUSCULAR | Status: AC
  Filled 2021-03-04: qty 30

## 2021-03-04 MED ORDER — HYDRALAZINE HCL 20 MG/ML IJ SOLN
INTRAMUSCULAR | Status: AC
  Filled 2021-03-04: qty 1

## 2021-03-04 MED ORDER — IBUPROFEN 800 MG PO TABS: 800.0000 mg | ORAL_TABLET | Freq: Three times a day (TID) | ORAL | Status: DC

## 2021-03-04 MED ORDER — HYDROMORPHONE HCL 1 MG/ML IJ SOLN
1.0000 mg | INTRAMUSCULAR | Status: DC | PRN
  Administered 2021-03-04 – 2021-03-06 (×4): 1 mg via INTRAVENOUS
  Filled 2021-03-04 (×4): qty 1

## 2021-03-04 MED ORDER — PROPOFOL 10 MG/ML IV BOLUS
INTRAVENOUS | Status: DC | PRN
  Administered 2021-03-04: 150 mg via INTRAVENOUS

## 2021-03-04 MED ORDER — SIMETHICONE 80 MG PO CHEW
80.0000 mg | CHEWABLE_TABLET | Freq: Three times a day (TID) | ORAL | Status: DC
  Administered 2021-03-05 – 2021-03-06 (×2): 80 mg via ORAL
  Filled 2021-03-04 (×2): qty 1

## 2021-03-04 MED ORDER — AMISULPRIDE (ANTIEMETIC) 5 MG/2ML IV SOLN: 10.0000 mg | Freq: Once | INTRAVENOUS | Status: DC | PRN

## 2021-03-04 MED ORDER — HYDROMORPHONE HCL 2 MG PO TABS
2.0000 mg | ORAL_TABLET | Freq: Four times a day (QID) | ORAL | Status: DC | PRN
  Filled 2021-03-04: qty 2

## 2021-03-04 MED ORDER — PROPOFOL 10 MG/ML IV BOLUS
INTRAVENOUS | Status: AC
  Filled 2021-03-04: qty 20

## 2021-03-04 MED ORDER — ONDANSETRON HCL 4 MG/2ML IJ SOLN
INTRAMUSCULAR | Status: DC | PRN
  Administered 2021-03-04: 4 mg via INTRAVENOUS

## 2021-03-04 MED ORDER — LIDOCAINE 2% (20 MG/ML) 5 ML SYRINGE
INTRAMUSCULAR | Status: AC
  Filled 2021-03-04: qty 5

## 2021-03-04 MED ORDER — HYDROMORPHONE HCL 1 MG/ML IJ SOLN: 0.2500 mg | INTRAMUSCULAR | Status: DC | PRN

## 2021-03-04 MED ORDER — OXYCODONE-ACETAMINOPHEN 5-325 MG PO TABS: 2.0000 | ORAL_TABLET | ORAL | Status: DC | PRN

## 2021-03-04 MED ORDER — CHLORHEXIDINE GLUCONATE 0.12 % MT SOLN
OROMUCOSAL | Status: AC
  Filled 2021-03-04: qty 15

## 2021-03-04 MED ORDER — HYDROMORPHONE HCL 1 MG/ML IJ SOLN
INTRAMUSCULAR | Status: AC
  Filled 2021-03-04: qty 1

## 2021-03-04 MED ORDER — BUPIVACAINE LIPOSOME 1.3 % IJ SUSP
INTRAMUSCULAR | Status: DC | PRN
  Administered 2021-03-04: 10 mL via PERINEURAL

## 2021-03-04 MED ORDER — MIDAZOLAM HCL 5 MG/5ML IJ SOLN
INTRAMUSCULAR | Status: DC | PRN
  Administered 2021-03-04: 1 mg via INTRAVENOUS

## 2021-03-04 MED ORDER — ONDANSETRON HCL 4 MG/2ML IJ SOLN
INTRAMUSCULAR | Status: AC
  Filled 2021-03-04: qty 2

## 2021-03-04 MED ORDER — ONDANSETRON HCL 4 MG PO TABS: 4.0000 mg | ORAL_TABLET | Freq: Four times a day (QID) | ORAL | Status: DC | PRN

## 2021-03-04 MED ORDER — ALBUTEROL SULFATE HFA 108 (90 BASE) MCG/ACT IN AERS
INHALATION_SPRAY | RESPIRATORY_TRACT | Status: DC | PRN
  Administered 2021-03-04: 6 via RESPIRATORY_TRACT

## 2021-03-04 MED ORDER — 0.9 % SODIUM CHLORIDE (POUR BTL) OPTIME
TOPICAL | Status: DC | PRN
  Administered 2021-03-04: 1000 mL

## 2021-03-04 MED ORDER — OXYCODONE-ACETAMINOPHEN 5-325 MG PO TABS
1.0000 | ORAL_TABLET | ORAL | Status: DC | PRN
  Filled 2021-03-04: qty 1

## 2021-03-04 MED ORDER — SIMETHICONE 80 MG PO CHEW: 80.0000 mg | CHEWABLE_TABLET | Freq: Four times a day (QID) | ORAL | Status: DC | PRN

## 2021-03-04 MED ORDER — KETOROLAC TROMETHAMINE 30 MG/ML IJ SOLN
INTRAMUSCULAR | Status: DC | PRN
  Administered 2021-03-04: 30 mg via INTRAVENOUS

## 2021-03-04 MED ORDER — BUPIVACAINE HCL (PF) 0.5 % IJ SOLN
INTRAMUSCULAR | Status: DC | PRN
  Administered 2021-03-04: 30 mL via PERINEURAL

## 2021-03-04 MED ORDER — AMLODIPINE BESYLATE 10 MG PO TABS
10.0000 mg | ORAL_TABLET | Freq: Every day | ORAL | Status: DC
  Administered 2021-03-04 – 2021-03-06 (×2): 10 mg via ORAL
  Filled 2021-03-04 (×3): qty 1

## 2021-03-04 MED ORDER — ROCURONIUM BROMIDE 10 MG/ML (PF) SYRINGE
PREFILLED_SYRINGE | INTRAVENOUS | Status: DC | PRN
Start: 2021-03-04 — End: 2021-03-04
  Administered 2021-03-04: 100 mg via INTRAVENOUS
  Administered 2021-03-04: 60 mg via INTRAVENOUS

## 2021-03-04 MED ORDER — ZOLPIDEM TARTRATE 5 MG PO TABS: 5.0000 mg | ORAL_TABLET | Freq: Every evening | ORAL | Status: DC | PRN

## 2021-03-04 MED ORDER — KETAMINE HCL 50 MG/5ML IJ SOSY
PREFILLED_SYRINGE | INTRAMUSCULAR | Status: AC
  Filled 2021-03-04: qty 5

## 2021-03-04 MED ORDER — EPHEDRINE SULFATE-NACL 50-0.9 MG/10ML-% IV SOSY
PREFILLED_SYRINGE | INTRAVENOUS | Status: DC | PRN
  Administered 2021-03-04: 15 mg via INTRAVENOUS
  Administered 2021-03-04 (×3): 5 mg via INTRAVENOUS
  Administered 2021-03-04: 10 mg via INTRAVENOUS
  Administered 2021-03-04 (×2): 5 mg via INTRAVENOUS

## 2021-03-04 MED ORDER — KETOROLAC TROMETHAMINE 15 MG/ML IJ SOLN
15.0000 mg | Freq: Four times a day (QID) | INTRAMUSCULAR | Status: AC
  Administered 2021-03-04 – 2021-03-05 (×4): 15 mg via INTRAVENOUS
  Filled 2021-03-04 (×4): qty 1

## 2021-03-04 MED ORDER — EPHEDRINE 5 MG/ML INJ
INTRAVENOUS | Status: AC
  Filled 2021-03-04: qty 10

## 2021-03-04 MED ORDER — LIDOCAINE 2% (20 MG/ML) 5 ML SYRINGE
INTRAMUSCULAR | Status: DC | PRN
  Administered 2021-03-04: 100 mg via INTRAVENOUS

## 2021-03-04 MED ORDER — HYDRALAZINE HCL 20 MG/ML IJ SOLN
5.0000 mg | INTRAMUSCULAR | Status: DC | PRN
  Administered 2021-03-04 (×3): 5 mg via INTRAVENOUS

## 2021-03-04 MED ORDER — ACETAMINOPHEN 500 MG PO TABS
1000.0000 mg | ORAL_TABLET | Freq: Once | ORAL | Status: DC
  Filled 2021-03-04: qty 2

## 2021-03-04 MED ORDER — SENNA 8.6 MG PO TABS
1.0000 | ORAL_TABLET | Freq: Two times a day (BID) | ORAL | Status: DC
  Administered 2021-03-05 – 2021-03-06 (×3): 8.6 mg via ORAL
  Filled 2021-03-04 (×4): qty 1

## 2021-03-04 MED ORDER — SPIRONOLACTONE 25 MG PO TABS
25.0000 mg | ORAL_TABLET | Freq: Every day | ORAL | Status: DC
  Administered 2021-03-04 – 2021-03-06 (×3): 25 mg via ORAL
  Filled 2021-03-04 (×3): qty 1

## 2021-03-04 SURGICAL SUPPLY — 35 items
BENZOIN TINCTURE PRP APPL 2/3 (GAUZE/BANDAGES/DRESSINGS) ×2 IMPLANT
CANISTER SUCT 3000ML PPV (MISCELLANEOUS) ×2 IMPLANT
CELLS DAT CNTRL 66122 CELL SVR (MISCELLANEOUS) ×1 IMPLANT
DRSG OPSITE POSTOP 4X10 (GAUZE/BANDAGES/DRESSINGS) ×2 IMPLANT
DURAPREP 26ML APPLICATOR (WOUND CARE) ×2 IMPLANT
GAUZE 4X4 16PLY ~~LOC~~+RFID DBL (SPONGE) ×2 IMPLANT
GLOVE SURG ENC MOIS LTX SZ7.5 (GLOVE) ×2 IMPLANT
GLOVE SURG UNDER POLY LF SZ7 (GLOVE) ×4 IMPLANT
GOWN STRL REUS W/ TWL LRG LVL3 (GOWN DISPOSABLE) ×2 IMPLANT
GOWN STRL REUS W/ TWL XL LVL3 (GOWN DISPOSABLE) ×2 IMPLANT
GOWN STRL REUS W/TWL LRG LVL3 (GOWN DISPOSABLE) ×2
GOWN STRL REUS W/TWL XL LVL3 (GOWN DISPOSABLE) ×2
HIBICLENS CHG 4% 4OZ BTL (MISCELLANEOUS) ×2 IMPLANT
KIT TURNOVER KIT B (KITS) ×2 IMPLANT
NS IRRIG 1000ML POUR BTL (IV SOLUTION) ×2 IMPLANT
PACK ABDOMINAL GYN (CUSTOM PROCEDURE TRAY) ×2 IMPLANT
PAD ARMBOARD 7.5X6 YLW CONV (MISCELLANEOUS) ×2 IMPLANT
PAD OB MATERNITY 4.3X12.25 (PERSONAL CARE ITEMS) ×2 IMPLANT
RTRCTR WOUND ALEXIS 18CM MED (MISCELLANEOUS) ×2
SPONGE T-LAP 18X18 ~~LOC~~+RFID (SPONGE) ×4 IMPLANT
STRIP CLOSURE SKIN 1/2X4 (GAUZE/BANDAGES/DRESSINGS) ×2 IMPLANT
SUT PLAIN 2 0 (SUTURE) ×1
SUT PLAIN ABS 2-0 CT1 27XMFL (SUTURE) ×1 IMPLANT
SUT VIC AB 0 CT1 18XCR BRD8 (SUTURE) ×2 IMPLANT
SUT VIC AB 0 CT1 27 (SUTURE) ×2
SUT VIC AB 0 CT1 27XBRD ANBCTR (SUTURE) ×2 IMPLANT
SUT VIC AB 0 CT1 8-18 (SUTURE) ×2
SUT VIC AB 2-0 CT1 27 (SUTURE) ×1
SUT VIC AB 2-0 CT1 TAPERPNT 27 (SUTURE) ×1 IMPLANT
SUT VIC AB 3-0 CT1 27 (SUTURE) ×1
SUT VIC AB 3-0 CT1 TAPERPNT 27 (SUTURE) ×1 IMPLANT
SUT VIC AB 4-0 KS 27 (SUTURE) ×4 IMPLANT
SUT VICRYL 1 TIES 12X18 (SUTURE) ×2 IMPLANT
TOWEL GREEN STERILE FF (TOWEL DISPOSABLE) ×4 IMPLANT
TRAY FOLEY W/BAG SLVR 14FR (SET/KITS/TRAYS/PACK) ×2 IMPLANT

## 2021-03-04 NOTE — Transfer of Care (Signed)
Immediate Anesthesia Transfer of Care Note  Patient: Suzanne Cline  Procedure(s) Performed: HYSTERECTOMY ABDOMINAL WITH BILATERAL SALPINGECTOMY (Bilateral: Abdomen)  Patient Location: PACU  Anesthesia Type:General and Regional  Level of Consciousness: drowsy  Airway & Oxygen Therapy: Patient Spontanous Breathing and Patient connected to face mask oxygen  Post-op Assessment: Report given to RN and Post -op Vital signs reviewed and stable  Post vital signs: Reviewed and stable  Last Vitals:  Vitals Value Taken Time  BP 158/101 03/04/21 0947  Temp    Pulse 58 03/04/21 0950  Resp 16 03/04/21 0950  SpO2 100 % 03/04/21 0950  Vitals shown include unvalidated device data.  Last Pain:  Vitals:   03/04/21 0646  TempSrc:   PainSc: 5          Complications: No notable events documented.

## 2021-03-04 NOTE — Anesthesia Procedure Notes (Signed)
Anesthesia Regional Block: TAP block   Pre-Anesthetic Checklist: , timeout performed,  Correct Patient, Correct Site, Correct Laterality,  Correct Procedure, Correct Position, site marked,  Risks and benefits discussed,  Surgical consent,  Pre-op evaluation,  At surgeon's request and post-op pain management  Laterality: Left and Right  Prep: Maximum Sterile Barrier Precautions used, chloraprep       Needles:  Injection technique: Single-shot  Needle Type: Echogenic Stimulator Needle     Needle Length: 9cm  Needle Gauge: 22     Additional Needles:   Procedures:,,,, ultrasound used (permanent image in chart),,    Narrative:  Start time: 03/04/2021 7:43 AM End time: 03/04/2021 7:50 AM Injection made incrementally with aspirations every 5 mL.  Performed by: Personally  Anesthesiologist: Pervis Hocking, DO  Additional Notes: Monitors applied. No increased pain on injection. No increased resistance to injection. Injection made in 5cc increments. Good needle visualization. Patient tolerated procedure well.

## 2021-03-04 NOTE — Anesthesia Procedure Notes (Signed)
Arterial Line Insertion Start/End11/11/2020 7:10 AM, 03/04/2021 7:15 AM Performed by: Pervis Hocking, DO, Holtzman, Ariel Melanee Left, CRNA, CRNA  Patient location: Pre-op. Preanesthetic checklist: patient identified, IV checked, site marked, risks and benefits discussed, surgical consent, monitors and equipment checked, pre-op evaluation, timeout performed and anesthesia consent Lidocaine 1% used for infiltration and patient sedated Left, radial was placed Catheter size: 20 G Hand hygiene performed  and maximum sterile barriers used   Attempts: 2 Procedure performed without using ultrasound guided technique. Following insertion, dressing applied and Biopatch. Post procedure assessment: normal  Post procedure complications: unsuccessful attempts and second provider assisted. Patient tolerated the procedure well with no immediate complications.

## 2021-03-04 NOTE — Interval H&P Note (Signed)
History and Physical Interval Note:  03/04/2021 7:26 AM  Suzanne Cline  has presented today for surgery, with the diagnosis of Fibroids.  The various methods of treatment have been discussed with the patient and family. After consideration of risks, benefits and other options for treatment, the patient has consented to  Procedure(s) with comments: HYSTERECTOMY ABDOMINAL WITH SALPINGECTOMY (Bilateral) - TAP BLOCK as a surgical intervention.  The patient's history has been reviewed, patient examined, no change in status, stable for surgery.  I have reviewed the patient's chart and labs.  Questions were answered to the patient's satisfaction.     [name]

## 2021-03-04 NOTE — Progress Notes (Signed)
Gynecology Post Op Note  Admission Date: 03/04/2021 Current Date: 03/04/2021 4:13 PM  Suzanne Cline is a 55 y.o. POD#0 s/p TAH/BS (EBL 239mL) for painful fibroids.  PMHx complicated by: Patient Active Problem List   Diagnosis Date Noted   Post-operative state 03/04/2021   Urinary frequency 10/10/2020   Diabetic polyneuropathy associated with type 2 diabetes mellitus ([place]) 04/30/2020   Fibroids 10/18/2019   Moderate mitral valve regurgitation 10/08/2019   Mild concentric left ventricular hypertrophy (LVH) 10/08/2019   GERD (gastroesophageal reflux disease) 08/25/2019   Esophageal eosinophilia 08/25/2019   Chronic constipation 06/29/2019   Hyperlipidemia associated with type 2 diabetes mellitus ([place]) 06/10/2019   Essential hypertension 03/02/2018   Hypertensive heart disease with diastolic heart failure ([place]) 01/21/2017   Postoperative hypothyroidism 01/21/2017   History of tobacco use 01/21/2017   Type 2 diabetes mellitus without complication, with long-term current use of insulin ([place]) 01/21/2017    Overnight/24hr events:  N/a  Subjective:  Patient has taken a minmal amount of PO w/o issue. No nausea, vomiting, ambulation, flatus.  Patient endorses pain at the incision site. Foley still in place  Objective:    Current Vital Signs 24h Vital Sign Ranges  T 98.9 F (37.2 C) Temp  Avg: 98.3 F (36.8 C)  Min: 97 F (36.1 C)  Max: 98.9 F (37.2 C)  BP 131/84 BP  Min: 131/84  Max: 181/109  HR 65 Pulse  Avg: 63.1  Min: 57  Max: 66  RR 18 Resp  Avg: 16.2  Min: 13  Max: 20  SaO2 97 % Room Air SpO2  Avg: 98.6 %  Min: 96 %  Max: 100 %       24 Hour I/O Current Shift I/O  Time Ins Outs No intake/output data recorded. 11/08 0701 - 11/08 1900 In: 1305.6 [I.V.:1205.6] Out: 1600 [Urine:1400]   Patient Vitals for the past 24 hrs:  BP Temp Temp src Pulse Resp SpO2 Height Weight  03/04/21 1204 131/84 98.9 F (37.2 C) Oral 65 18 97 % -- --  03/04/21 1149 137/83 -- -- 65 17 96 % --  --  03/04/21 1147 (!) 141/86 98.9 F (37.2 C) -- 66 18 96 % -- --  03/04/21 1132 (!) 141/86 -- -- 63 19 100 % -- --  03/04/21 1117 (!) 153/100 -- -- 64 20 100 % -- --  03/04/21 1102 (!) 155/99 -- -- 64 17 100 % -- --  03/04/21 1053 (!) 165/103 -- -- -- -- -- -- --  03/04/21 1047 (!) 165/103 -- -- 64 14 99 % -- --  03/04/21 1043 (!) 160/99 -- -- 63 14 99 % -- --  03/04/21 1032 (!) 181/109 -- -- 64 14 100 % -- --  03/04/21 1028 (!) 177/111 -- -- -- -- -- -- --  03/04/21 1017 (!) 177/111 -- -- 62 13 99 % -- --  03/04/21 1002 (!) 156/101 -- -- (!) 57 15 99 % -- --  03/04/21 0947 (!) 158/101 (!) 97 F (36.1 C) -- 63 14 100 % -- --  03/04/21 0611 (!) 147/93 98.2 F (36.8 C) Oral 60 17 97 % 5\' 3"  (1.6 m) 76.2 kg   Physical exam: General: Well nourished, well developed female in no acute distress; tired Abdomen: no BS, soft, appropriately ttp, non distended. C/d/I dressing GU: scant amount of old blood on pad. Approximately 137mL in foley bag (clear UOP) Cardiovascular: S1, S2 normal, no murmur, rub or gallop, regular rate and rhythm Respiratory: CTAB Extremities:  no clubbing, cyanosis or edema Skin: Warm and dry.   Medications: Current Facility-Administered Medications  Medication Dose Route Frequency Provider Last Rate Last Admin   amLODipine (NORVASC) tablet 10 mg  10 mg Oral Daily Aletha Halim, MD   10 mg at 03/04/21 1336   carvedilol (COREG) tablet 25 mg  25 mg Oral BID WC Aletha Halim, MD       hydrALAZINE (APRESOLINE) 20 MG/ML injection            HYDROmorphone (DILAUDID) injection 1 mg  1 mg Intravenous Q2H PRN Aletha Halim, MD       Derrill Memo ON 03/05/2021] ibuprofen (ADVIL) tablet 600 mg  600 mg Oral Q6H PRN Aletha Halim, MD       insulin glargine-yfgn (SEMGLEE) injection 20 Units  20 Units Subcutaneous QHS Aletha Halim, MD       irbesartan (AVAPRO) tablet 300 mg  300 mg Oral Daily Aletha Halim, MD       ketorolac (TORADOL) 15 MG/ML injection 15 mg  15 mg  Intravenous Q6H Aletha Halim, MD   15 mg at 03/04/21 1336   lactated ringers infusion   Intravenous Continuous Aletha Halim, MD 100 mL/hr at 03/04/21 1255 New Bag at 03/04/21 1255   [START ON 03/05/2021] levothyroxine (SYNTHROID) tablet 150 mcg  150 mcg Oral Q0600 Aletha Halim, MD       [START ON 03/05/2021] metFORMIN (GLUCOPHAGE-XR) 24 hr tablet 1,000 mg  1,000 mg Oral Q breakfast Aletha Halim, MD       ondansetron (ZOFRAN) tablet 4 mg  4 mg Oral Q6H PRN Aletha Halim, MD       Or   ondansetron (ZOFRAN) injection 4 mg  4 mg Intravenous Q6H PRN Aletha Halim, MD       oxyCODONE-acetaminophen (PERCOCET/ROXICET) 5-325 MG per tablet 1 tablet  1 tablet Oral Q4H PRN Aletha Halim, MD       oxyCODONE-acetaminophen (PERCOCET/ROXICET) 5-325 MG per tablet 2 tablet  2 tablet Oral Q4H PRN Aletha Halim, MD       senna (SENOKOT) tablet 8.6 mg  1 tablet Oral BID Aletha Halim, MD       simethicone (MYLICON) chewable tablet 80 mg  80 mg Oral QID PRN Aletha Halim, MD       spironolactone (ALDACTONE) tablet 25 mg  25 mg Oral Daily Aletha Halim, MD   25 mg at 03/04/21 1336   zolpidem (AMBIEN) tablet 5 mg  5 mg Oral QHS PRN Aletha Halim, MD        Labs:  Recent Labs  Lab 02/26/21 1045  WBC 6.1  HGB 13.0  HCT 42.1  PLT 219    Recent Labs  Lab 02/26/21 1045  NA 139  K 4.3  CL 107  CO2 27  BUN 17  CREATININE 1.30*  CALCIUM 9.2  GLUCOSE 77   Results for MILLEE, DENISE (MRN [id number]) as of 03/04/2021 16:17  Ref. Range 11/15/2020 08:18 02/26/2021 10:06 03/04/2021 06:13 03/04/2021 09:51 03/04/2021 13:05  Glucose-Capillary Latest Ref Range: 70 - 99 mg/dL 248 (H) 76 121 (H) 161 (H) 158 (H)    Radiology:  No new imaging  Assessment & Plan:  Patient stable *GYN: routine post op care. D/c foley tomorrow. Patient states she lives alone. I told her that she  will need help for the next week or so after surgery when she goes home. She states she lives alone but has a  son in [place] that can help her. I told her that if everything  goes well then she'll probably be able to go home on Friday. Given her co-morbidities, I've consulted PT for tomorrow. Follow up final pathology *CV: continue home regimen *Pulmonary: patient states she uses cpap at home but doesn't know settings and she lives alone (see above). RT consulted for CPAP for tonight *Hypothyroid: continue home regimen *Renal: pt on toradol x 24h. F/u BMP tomorrow. Baseline Cr 0.9-1.1 *DM2: continue current regimen. Watch CBGs closely as patient is not taking really any PO.  *Pain: switched to dilaudid from percocet with IV for breakthrough *PPx: SCDs. Lovenox 24h post op if doing well *FEN/GI: clears, MIVF decreased to 28mL due to cardiac issues and making great UOP. Continue BM regimen *Dispo: likely POD#3-4  Durene Romans MD Attending Center for [facility]) GYN Consult Phone: [phone] (M-F, [phone]) & [phone]  (Off hours, weekends, holidays)

## 2021-03-04 NOTE — Op Note (Signed)
Patric Dykes PROCEDURE DATE: 03/04/2021  PREOPERATIVE DIAGNOSIS:  Symptomatic fibroids, menorrhagia POSTOPERATIVE DIAGNOSIS:  Symptomatic fibroids, menorrhagia SURGEON:   Arlina Robes, M. D.  ASSISTANT: Radene Gunning, M.D.  An experienced assistant was required given the standard of surgical care given the complexity of the case.  This assistant was needed for exposure, dissection, suctioning, retraction and for overall help during the procedure.   OPERATION:  Total abdominal hysterectomy, Bilateral Salpingectomy  ANESTHESIA:  General endotracheal.  INDICATIONS: The patient is a 55 y.o. G3P0 with the aforementioned diagnoses who desires definitive surgical management. On the preoperative visit, the risks, benefits, indications, and alternatives of the procedure were reviewed with the patient.  On the day of surgery, the risks of surgery were again discussed with the patient including but not limited to: bleeding which may require transfusion or reoperation; infection which may require antibiotics; injury to bowel, bladder, ureters or other surrounding organs; need for additional procedures; thromboembolic phenomenon, incisional problems and other postoperative/anesthesia complications. Written informed consent was obtained.    OPERATIVE FINDINGS: A 18 week size uterus with normal tubes and ovaries bilaterally.  ESTIMATED BLOOD LOSS: 250 ml FLUIDS: As record URINE OUTPUT:  As record SPECIMENS:  Uterus,cervix,  bilateral fallopian tubes sent to pathology COMPLICATIONS:  None immediate.   DESCRIPTION OF PROCEDURE:  The patient received intravenous antibiotics and had sequential compression devices applied to her lower extremities while in the preoperative area.   She was taken to the operating room and placed under general anesthesia without difficulty.The abdomen and perineum were prepped and draped in a sterile manner, and she was placed in a dorsal supine position.  A Foley catheter was  inserted into the bladder and attached to constant drainage. After an adequate timeout was performed, a Pfannensteil skin incision was made. This incision was taken down to the fascia using electrocautery with care given to maintain good hemostasis. The fascia was incised in the midline and the fascial incision was then extended bilaterally using electrocautery without difficulty. The fascia was then dissected off the underlying rectus muscles using blunt and sharp dissection. The rectus muscles were split bluntly in the midline and the peritoneum entered sharply without complication. This peritoneal incision was then extended superiorly and inferiorly with care given to prevent bowel or bladder injury. Attention was then turned to the pelvis. A retractor was placed into the incision, and the bowel was packed away with moist laparotomy sponges. The uterus at this point was noted to be mobilized and was delivered up out of the abdomen.  The round ligaments on each side were clamped, suture ligated with 0 Vicryl, and transected with electrocautery allowing entry into the broad ligament. Of note, all sutures used in this procedure are 0 Vicryl unless otherwise noted. The anterior and posterior leaves of the broad ligament were separated, and the ureters were inspected to be safely away from the area of dissection bilaterally.  Adnexae were clamped on the patient's right side, cut, and doubly suture ligated. This procedure was repeated in an identical fashion on the left site allowing for both adnexa to remain in place.  Kelly clamps were placed on the mesosalpinx of the right fallopian tube, and the fallopian tube was excised.  The pedicle was then secured with a free tie.  A similar process was carried out on the left side, allowing for bilateral salpingectomy.     A bladder flap was then created.  The bladder was then bluntly dissected off the lower uterine segment and cervix  with good hemostasis noted. The uterine  arteries were then skeletonized bilaterally and then clamped, cut, and doubly suture ligated with care given to prevent ureteral injury.  The uterosacral ligaments were then clamped, cut, and ligated bilaterally.  Finally, the cardinal ligaments were clamped, cut, and ligated bilaterally.  Acutely curved clamps were placed across the vagina just under the cervix, and the specimen was amputated and sent to pathology. The vaginal cuff angles were closed with Heaney stiches with care given to incorporate the uterosacral-cardinal ligament pedicles on both sides. The middle of the vaginal cuff was closed with a series of interrupted figure-of-eight sutures with care given to incorporate the anterior pubocervical fascia and the posterior rectovaginal fascia.   The pelvis was irrigated and hemostasis was reconfirmed at all pedicles and along the pelvic sidewall.  The ureters were inspected and noted to be peristalsing bilaterally.  All laparotomy sponges and instruments were removed from the abdomen. The peritoneum was closed with a running stitch, and the fascia was also closed in a running fashion. The subcutaneous layer was reapproximated with 2-0 plain gut. The skin was closed with a 4-0 Vicryl subcuticular stitch. Sponge, lap, needle, and instrument counts were correct times two. The patient was taken to the recovery area awake, extubated and in stable condition.  Arlina Robes, MD, [place] Attending [place], [facility]

## 2021-03-04 NOTE — Anesthesia Procedure Notes (Signed)
Procedure Name: Intubation Date/Time: 03/04/2021 7:38 AM Performed by: Vonna Drafts, CRNA Pre-anesthesia Checklist: Patient identified, Emergency Drugs available, Suction available and Patient being monitored Patient Re-evaluated:Patient Re-evaluated prior to induction Oxygen Delivery Method: Circle system utilized Preoxygenation: Pre-oxygenation with 100% oxygen Induction Type: IV induction Ventilation: Oral airway inserted - appropriate to patient size and Two handed mask ventilation required Laryngoscope Size: Glidescope and 4 Grade View: Grade I Tube type: Oral Tube size: 7.0 mm Number of attempts: 3 Airway Equipment and Method: Stylet and Oral airway Placement Confirmation: ETT inserted through vocal cords under direct vision, positive ETCO2 and breath sounds checked- equal and bilateral Secured at: 22 cm Tube secured with: Tape Dental Injury: Teeth and Oropharynx as per pre-operative assessment  Difficulty Due To: Difficult Airway- due to anterior larynx and Difficult Airway- due to large tongue Comments: Recommend glidescope for intubation in future cases.

## 2021-03-04 NOTE — Progress Notes (Signed)
Patient arrived to [facility] room 4 alert and oriented. Pain level 3. Bed in lowest position. Call light in reach.

## 2021-03-05 ENCOUNTER — Encounter (HOSPITAL_COMMUNITY): Payer: Self-pay | Admitting: Obstetrics and Gynecology

## 2021-03-05 LAB — GLUCOSE, CAPILLARY
Glucose-Capillary: 175 mg/dL — ABNORMAL HIGH (ref 70–99)
Glucose-Capillary: 173 mg/dL — ABNORMAL HIGH (ref 70–99)
Glucose-Capillary: 143 mg/dL — ABNORMAL HIGH (ref 70–99)
Glucose-Capillary: 156 mg/dL — ABNORMAL HIGH (ref 70–99)

## 2021-03-05 LAB — CBC
HCT: 38.5 % (ref 36.0–46.0)
Hemoglobin: 12.6 g/dL (ref 12.0–15.0)
MCH: 28.3 pg (ref 26.0–34.0)
MCHC: 32.7 g/dL (ref 30.0–36.0)
MCV: 86.3 fL (ref 80.0–100.0)
Platelets: 229 10*3/uL (ref 150–400)
RBC: 4.46 MIL/uL (ref 3.87–5.11)
RDW: 13.9 % (ref 11.5–15.5)
WBC: 10.8 10*3/uL — ABNORMAL HIGH (ref 4.0–10.5)
nRBC: 0 % (ref 0.0–0.2)

## 2021-03-05 LAB — BASIC METABOLIC PANEL
Anion gap: 11 (ref 5–15)
BUN: 11 mg/dL (ref 6–20)
CO2: 28 mmol/L (ref 22–32)
Calcium: 9.1 mg/dL (ref 8.9–10.3)
Chloride: 95 mmol/L — ABNORMAL LOW (ref 98–111)
Creatinine, Ser: 1.12 mg/dL — ABNORMAL HIGH (ref 0.44–1.00)
GFR, Estimated: 58 mL/min — ABNORMAL LOW (ref >60)
Glucose, Bld: 180 mg/dL — ABNORMAL HIGH (ref 70–99)
Potassium: 4.1 mmol/L (ref 3.5–5.1)
Sodium: 134 mmol/L — ABNORMAL LOW (ref 135–145)

## 2021-03-05 MED ORDER — ENOXAPARIN SODIUM 40 MG/0.4ML IJ SOSY
40.0000 mg | PREFILLED_SYRINGE | INTRAMUSCULAR | Status: DC
  Administered 2021-03-05: 40 mg via SUBCUTANEOUS
  Filled 2021-03-05: qty 0.4

## 2021-03-05 MED ORDER — HYOSCYAMINE SULFATE 0.125 MG PO TBDP
0.1250 mg | ORAL_TABLET | Freq: Three times a day (TID) | ORAL | Status: DC
  Administered 2021-03-05 – 2021-03-06 (×2): 0.125 mg via SUBLINGUAL
  Filled 2021-03-05 (×4): qty 1

## 2021-03-05 MED ORDER — POLYETHYLENE GLYCOL 3350 17 G PO PACK
17.0000 g | PACK | Freq: Every day | ORAL | Status: DC
  Administered 2021-03-05: 17 g via ORAL
  Filled 2021-03-05: qty 1

## 2021-03-05 MED ORDER — DAPAGLIFLOZIN PROPANEDIOL 10 MG PO TABS
10.0000 mg | ORAL_TABLET | Freq: Every day | ORAL | Status: DC
  Administered 2021-03-05: 10 mg via ORAL
  Filled 2021-03-05 (×2): qty 1

## 2021-03-05 NOTE — Progress Notes (Signed)
Pt ambulated in the hallway, tolerating well.

## 2021-03-05 NOTE — Progress Notes (Signed)
Gynecology Post Op Note  Admission Date: 03/04/2021 Current Date: 03/05/2021 1:02 PM  Suzanne Cline is a 55 y.o. POD#1 s/p TAH/BS (EBL 289mL) for painful fibroids.  PMHx complicated by: Patient Active Problem List   Diagnosis Date Noted   Post-operative state 03/04/2021   Urinary frequency 10/10/2020   Diabetic polyneuropathy associated with type 2 diabetes mellitus ([place]) 04/30/2020   Fibroids 10/18/2019   Moderate mitral valve regurgitation 10/08/2019   Mild concentric left ventricular hypertrophy (LVH) 10/08/2019   GERD (gastroesophageal reflux disease) 08/25/2019   Esophageal eosinophilia 08/25/2019   Chronic constipation 06/29/2019   Hyperlipidemia associated with type 2 diabetes mellitus ([place]) 06/10/2019   Essential hypertension 03/02/2018   Hypertensive heart disease with diastolic heart failure ([place]) 01/21/2017   Postoperative hypothyroidism 01/21/2017   History of tobacco use 01/21/2017   Type 2 diabetes mellitus without complication, with long-term current use of insulin ([place]) 01/21/2017    Overnight/24hr events:  N/a  Subjective:  +ambulation and just back from walking with PT and she did great they are signing off. +void, taking PO and pain controlled with PO meds. Patient with minimal bleeding and had some flatus, no n/v  Objective:    Current Vital Signs 24h Vital Sign Ranges  T 98.6 F (37 C) Temp  Avg: 98.2 F (36.8 C)  Min: 97.7 F (36.5 C)  Max: 98.6 F (37 C)  BP 133/82 BP  Min: 133/82  Max: 166/100  HR 70 Pulse  Avg: 68.2  Min: 65  Max: 71  RR 16 Resp  Avg: 17.2  Min: 16  Max: 19  SaO2 98 % Room Air SpO2  Avg: 98.6 %  Min: 98 %  Max: 100 %       24 Hour I/O Current Shift I/O  Time Ins Outs 11/08 0701 - 11/09 0700 In: 1798.9 [P.O.:240; I.V.:1458.9] Out: 3850 [Urine:3650] No intake/output data recorded.   Patient Vitals for the past 24 hrs:  BP Temp Temp src Pulse Resp SpO2  03/05/21 0700 133/82 98.6 F (37 C) Oral 70 16 98 %  03/05/21 0500 (!)  145/93 98.5 F (36.9 C) Oral 71 18 98 %  03/05/21 0000 (!) 143/87 98.2 F (36.8 C) Oral 65 17 99 %  03/04/21 2025 (!) 158/95 97.8 F (36.6 C) Oral 67 19 98 %  03/04/21 1726 (!) 166/100 97.7 F (36.5 C) Oral 68 16 100 %    Physical exam: General: Well nourished, well developed female in no acute distress; tired Abdomen: no BS, soft, appropriately ttp, non distended. C/d/I dressing GU: scant amount of old blood on pad.  Cardiovascular: S1, S2 normal, no murmur, rub or gallop, regular rate and rhythm Respiratory: CTAB Extremities: no clubbing, cyanosis or edema Skin: Warm and dry.   Medications: Current Facility-Administered Medications  Medication Dose Route Frequency Provider Last Rate Last Admin   amLODipine (NORVASC) tablet 10 mg  10 mg Oral Daily Aletha Halim, MD   10 mg at 03/04/21 1336   carvedilol (COREG) tablet 25 mg  25 mg Oral BID WC Aletha Halim, MD   25 mg at 03/05/21 0753   HYDROmorphone (DILAUDID) injection 1 mg  1 mg Intravenous Q2H PRN Aletha Halim, MD   1 mg at 03/05/21 1206   HYDROmorphone (DILAUDID) tablet 2-4 mg  2-4 mg Oral Q6H PRN Aletha Halim, MD       ibuprofen (ADVIL) tablet 600 mg  600 mg Oral Q6H PRN Aletha Halim, MD       insulin glargine-yfgn (SEMGLEE)  injection 20 Units  20 Units Subcutaneous QHS Aletha Halim, MD   20 Units at 03/04/21 2137   irbesartan (AVAPRO) tablet 300 mg  300 mg Oral Daily Aletha Halim, MD   300 mg at 03/05/21 2119   lactated ringers infusion   Intravenous Continuous Aletha Halim, MD 100 mL/hr at 03/04/21 2017 New Bag at 03/04/21 2017   levothyroxine (SYNTHROID) tablet 150 mcg  150 mcg Oral Q0600 Aletha Halim, MD   150 mcg at 03/05/21 0505   metFORMIN (GLUCOPHAGE-XR) 24 hr tablet 1,000 mg  1,000 mg Oral Q breakfast Aletha Halim, MD   1,000 mg at 03/05/21 0753   ondansetron (ZOFRAN) tablet 4 mg  4 mg Oral Q6H PRN Aletha Halim, MD       Or   ondansetron (ZOFRAN) injection 4 mg  4 mg  Intravenous Q6H PRN Aletha Halim, MD       polyethylene glycol (MIRALAX / GLYCOLAX) packet 17 g  17 g Oral Q supper Aletha Halim, MD       senna (SENOKOT) tablet 8.6 mg  1 tablet Oral BID Aletha Halim, MD   8.6 mg at 03/05/21 0942   simethicone (MYLICON) chewable tablet 80 mg  80 mg Oral TID Melvern Sample, MD       spironolactone (ALDACTONE) tablet 25 mg  25 mg Oral Daily Aletha Halim, MD   25 mg at 03/05/21 0942   zolpidem (AMBIEN) tablet 5 mg  5 mg Oral QHS PRN Aletha Halim, MD        Labs:  Recent Labs  Lab 03/05/21 0132  WBC 10.8*  HGB 12.6  HCT 38.5  PLT 229     Recent Labs  Lab 03/05/21 0132  NA 134*  K 4.1  CL 95*  CO2 28  BUN 11  CREATININE 1.12*  CALCIUM 9.1  GLUCOSE 180*    Results for BRIDGET, [place] (MRN [id number]) as of 03/05/2021 16:03  Ref. Range 03/04/2021 13:05 03/04/2021 17:33 03/04/2021 20:28 03/05/2021 07:42 03/05/2021 12:08  Glucose-Capillary Latest Ref Range: 70 - 99 mg/dL 158 (H) 177 (H) 161 (H) 175 (H) 173 (H)    Radiology:  No new imaging  Assessment & Plan:  Patient stable *GYN: routine post op care. Follow up final pathology *CV: continue home regimen *Pulmonary: continue cpap for tonight as well *Hypothyroid: continue home regimen *Renal: at baseline,  *DM2: continue current regimen. Restart her farxiga *Pain: controled on PO meds *PPx: SCDs. Start lovenox tonight *FEN/GI: dm diet, sliv Continue BM regimen *Dispo: likely tomorrow  Durene Romans MD Attending Center for [place] (Faculty Practice) GYN Consult Phone: [phone] (M-F, [phone]) & [phone]  (Off hours, weekends, holidays)

## 2021-03-05 NOTE — Evaluation (Signed)
Physical Therapy Evaluation Patient Details Name: Suzanne Cline MRN: [id number] DOB: Mar 13, 1966 Today's Date: 03/05/2021  History of Present Illness  55 yo female s/p Total abdominal hysterectomy, Bilateral Salpingectomy on 11/8. PMH includes HF, DM with diabetic neuropathy, fibroids, former smoker, HTN.  Clinical Impression   Pt presents with moderate post-operative pain and increased time/effort to mobilize. Pt navigated hallway without AD and without difficulty, min cues for upright posture given abdominal incision. PT educated pt on bracing abdomen with pillow when coughing, sneezing, hiccupping, etc for pt comfort, pt provided with pillow. Pt mobilizing at mod I level, PT encouraged pt to mobilize OOB at least 6 times a day for recovery. No further PT needs at this time. PT to sign off, thank you for the consult.         Recommendations for follow up therapy are one component of a multi-disciplinary discharge planning process, led by the attending physician.  Recommendations may be updated based on patient status, additional functional criteria and insurance authorization.  Follow Up Recommendations No PT follow up    Assistance Recommended at Discharge None  Functional Status Assessment Patient has had a recent decline in their functional status and demonstrates the ability to make significant improvements in function in a reasonable and predictable amount of time.  Equipment Recommendations  None recommended by PT    Recommendations for Other Services       Precautions / Restrictions Precautions Precautions: Other (comment) Precaution Comments: reviewed abdominal precautions - log roll in and out of bed, pillow bracing with increased intraabdominal pressure (coughing, sneezing) Restrictions Weight Bearing Restrictions: No      Mobility  Bed Mobility Overal bed mobility: Modified Independent Bed Mobility: Rolling;Sidelying to Sit Rolling: Modified independent  (Device/Increase time) Sidelying to sit: Modified independent (Device/Increase time)       General bed mobility comments: cues for log roll technique    Transfers Overall transfer level: Modified independent Equipment used: None                    Ambulation/Gait Ambulation/Gait assistance: Modified independent (Device/Increase time) Gait Distance (Feet): 220 Feet Assistive device: None Gait Pattern/deviations: Step-through pattern;Trunk flexed       General Gait Details: cues for upright posture, slightly slowed speed secondary to pain only  Stairs            Wheelchair Mobility    Modified Rankin (Stroke Patients Only)       Balance Overall balance assessment: Modified Independent                                           Pertinent Vitals/Pain Pain Assessment: 0-10 Pain Score: 6  Pain Location: abdomen Pain Descriptors / Indicators: Sore Pain Intervention(s): Limited activity within patient's tolerance;Monitored during session;Repositioned    Home Living Family/patient expects to be discharged to:: Private residence Living Arrangements: Alone Available Help at Discharge: Family Type of Home: Apartment Home Access: Stairs to enter Entrance Stairs-Rails: Chemical engineer of Steps: 2 flights   Home Layout: One level Home Equipment: None      Prior Function Prior Level of Function : Independent/Modified Independent               ADLs Comments: pt works from home     Hand Dominance   Dominant Hand: Right    Extremity/Trunk Assessment   Upper Extremity Assessment Upper  Extremity Assessment: Overall WFL for tasks assessed    Lower Extremity Assessment Lower Extremity Assessment: Overall WFL for tasks assessed    Cervical / Trunk Assessment Cervical / Trunk Assessment: Other exceptions Cervical / Trunk Exceptions: lower abdomen is painful from surgery, tends to forward flex at trunk but  responds well to cues for upright posture  Communication   Communication: No difficulties  Cognition Arousal/Alertness: Awake/alert Behavior During Therapy: WFL for tasks assessed/performed Overall Cognitive Status: Within Functional Limits for tasks assessed                                          General Comments      Exercises     Assessment/Plan    PT Assessment Patient does not need any further PT services  PT Problem List Decreased knowledge of precautions       PT Treatment Interventions  (n/a)    PT Goals (Current goals can be found in the Care Plan section)  Acute Rehab PT Goals Patient Stated Goal: home PT Goal Formulation: With patient Time For Goal Achievement: 03/05/21 Potential to Achieve Goals: Good    Frequency  (n/a)   Barriers to discharge        Co-evaluation               AM-PAC PT "6 Clicks" Mobility  Outcome Measure Help needed turning from your back to your side while in a flat bed without using bedrails?: None Help needed moving from lying on your back to sitting on the side of a flat bed without using bedrails?: None Help needed moving to and from a bed to a chair (including a wheelchair)?: None Help needed standing up from a chair using your arms (e.g., wheelchair or bedside chair)?: None Help needed to walk in hospital room?: None Help needed climbing 3-5 steps with a railing? : None 6 Click Score: 24    End of Session   Activity Tolerance: Patient tolerated treatment well Patient left: in bed;with call bell/phone within reach Nurse Communication: Mobility status PT Visit Diagnosis: Other abnormalities of gait and mobility (R26.89)    Time: 4315-4008 PT Time Calculation (min) (ACUTE ONLY): 15 min   Charges:   PT Evaluation $PT Eval Low Complexity: 1 Low          Traniya Prichett S, PT DPT Acute Rehabilitation Services Pager [phone]  Office [phone]   Roxine Caddy E Ruffin Pyo 03/05/2021, 12:11 PM

## 2021-03-05 NOTE — Progress Notes (Signed)
Mobility Specialist Progress Note:   03/05/21 1500  Mobility  Activity Ambulated in hall  Level of Assistance Independent  Assistive Device None  Distance Ambulated (ft) 220 ft  Mobility Ambulated independently in hallway  Mobility Response Tolerated well  Mobility performed by Mobility specialist  $Mobility charge 1 Mobility   Pt asx during ambulation. Encouraged another walk this afternoon.   Nelta Numbers Mobility Specialist  Phone [phone]

## 2021-03-05 NOTE — Anesthesia Postprocedure Evaluation (Signed)
Anesthesia Post Note  Patient: Suzanne Cline  Procedure(s) Performed: HYSTERECTOMY ABDOMINAL WITH BILATERAL SALPINGECTOMY (Bilateral: Abdomen)     Patient location during evaluation: PACU Anesthesia Type: Regional and General Level of consciousness: awake and alert, oriented and patient cooperative Pain management: pain level controlled Vital Signs Assessment: post-procedure vital signs reviewed and stable Respiratory status: spontaneous breathing, nonlabored ventilation and respiratory function stable Cardiovascular status: blood pressure returned to baseline and stable Postop Assessment: no apparent nausea or vomiting Anesthetic complications: yes   Encounter Notable Events  Notable Event Outcome Phase Comment  Difficult mask airway  Intraprocedure 2 person mask ventilation w/ oral airway  Difficult to intubate - unexpected  Intraprocedure glidescope    Last Vitals:  Vitals:   03/05/21 0700 03/05/21 1626  BP: 133/82 128/82  Pulse: 70 63  Resp: 16 15  Temp: 37 C 36.7 C  SpO2: 98% 99%    Last Pain:  Vitals:   03/05/21 1626  TempSrc: Oral  PainSc:                  Pervis Hocking

## 2021-03-05 NOTE — Progress Notes (Addendum)
ANTICOAGULATION CONSULT NOTE   Pharmacy Consult for Enoxparin Indication: VTE prophylaxis VTE prophylaxis Allergies  Allergen Reactions   Chlorhexidine     Itching,redness   Lisinopril Cough   Oxycodone Itching    Patient Measurements: Height: 5\' 3"  (160 cm) Weight: 76.2 kg (168 lb) IBW/kg (Calculated) : 52.4  Vital Signs: Temp: 98 F (36.7 C) (11/09 1626) Temp Source: Oral (11/09 1626) BP: 128/82 (11/09 1626) Pulse Rate: 63 (11/09 1626)  Labs: Recent Labs    03/05/21 0132  HGB 12.6  HCT 38.5  PLT 229  CREATININE 1.12*    Estimated Creatinine Clearance: 54.8 mL/min (A) (by C-G formula based on SCr of 1.12 mg/dL (H)).   Medical History: Past Medical History:  Diagnosis Date   CHF (congestive heart failure) ([place])    Diabetes mellitus without complication ([place])    Difficult intubation    Family history of adverse reaction to anesthesia    Fibroids, intramural    s/p uterine artery embolization   Former cigarette smoker 04/2011   History of Resolved Nonischemic Congestive Cardiomyopathy ([place]) 04/2011   Echo March 2013: EF 35% with mild/moderate diastolic dysfunction -> Follow-Up Echo March 2014 EF 55% with G1 DD.   Hypertension    Hypertensive heart disease    Difficult control hypertension   Hypothyroidism    Mild concentric left ventricular hypertrophy (LVH) 10/08/2019   Moderate mitral valve regurgitation 10/08/2019   Echo done June 2021; Dr. Ellyn Hack   Neuromuscular disorder [facility])    diabetic neuropathy   OSA (obstructive sleep apnea)    Sleep apnea    doesn't use cpap regularly   Thyroid disease    post thyroidectomy    Assessment: 55 yr old woman, S/P TAH/BS for painful fibroids on 03/04/21. Pharmacy is consulted to dose enoxaparin for VTE prophylaxis. Pt was not on anticoagulant PTA and has not rec'd any anticoagulants since admission.  H/H 12.6/38.5, plt 229 (CBC WNL); Scr 1.12, TBW CrCl ~68 ml/min. Per RN, no bleeding issues post op.  Goal of  Therapy:  Prevention of VTE Monitor platelets by anticoagulation protocol: Yes   Plan:  Enoxaprin 40 mg SQ daily (start tonight, per provider note) Monitor CBC, renal function Monitor for bleeding  Gillermina Hu, PharmD, BCPS, [facility] Clinical Pharmacist 03/05/2021,4:30 PM

## 2021-03-06 ENCOUNTER — Encounter: Payer: Self-pay | Admitting: Obstetrics and Gynecology

## 2021-03-06 ENCOUNTER — Other Ambulatory Visit: Payer: Self-pay | Admitting: Obstetrics and Gynecology

## 2021-03-06 ENCOUNTER — Other Ambulatory Visit: Payer: Self-pay

## 2021-03-06 ENCOUNTER — Encounter (HOSPITAL_COMMUNITY): Payer: Self-pay

## 2021-03-06 LAB — BASIC METABOLIC PANEL
Anion gap: 7 (ref 5–15)
BUN: 18 mg/dL (ref 6–20)
CO2: 28 mmol/L (ref 22–32)
Calcium: 9.1 mg/dL (ref 8.9–10.3)
Chloride: 99 mmol/L (ref 98–111)
Creatinine, Ser: 1.09 mg/dL — ABNORMAL HIGH (ref 0.44–1.00)
GFR, Estimated: 60 mL/min — ABNORMAL LOW (ref >60)
Glucose, Bld: 123 mg/dL — ABNORMAL HIGH (ref 70–99)
Potassium: 3.7 mmol/L (ref 3.5–5.1)
Sodium: 134 mmol/L — ABNORMAL LOW (ref 135–145)

## 2021-03-06 LAB — GLUCOSE, CAPILLARY: Glucose-Capillary: 99 mg/dL (ref 70–99)

## 2021-03-06 LAB — SURGICAL PATHOLOGY

## 2021-03-06 MED ORDER — POLYETHYLENE GLYCOL 3350 17 GM/SCOOP PO POWD
17.0000 g | Freq: Every day | ORAL | 0 refills | Status: DC
  Filled 2021-03-06: qty 238, 14d supply, fill #0

## 2021-03-06 MED ORDER — SIMETHICONE 80 MG PO CHEW
80.0000 mg | CHEWABLE_TABLET | Freq: Three times a day (TID) | ORAL | 1 refills | Status: DC
  Filled 2021-03-06: qty 30, 10d supply, fill #0

## 2021-03-06 MED ORDER — HYDROMORPHONE HCL 2 MG PO TABS: 2.0000 mg | ORAL_TABLET | Freq: Four times a day (QID) | ORAL | 0 refills | Status: DC | PRN

## 2021-03-06 MED ORDER — ONDANSETRON HCL 4 MG PO TABS
4.0000 mg | ORAL_TABLET | Freq: Four times a day (QID) | ORAL | 0 refills | Status: DC | PRN
Start: 2021-03-06 — End: 2021-03-31
  Filled 2021-03-06: qty 20, 5d supply, fill #0

## 2021-03-06 MED ORDER — HYDROMORPHONE HCL 2 MG PO TABS
2.0000 mg | ORAL_TABLET | Freq: Four times a day (QID) | ORAL | 0 refills | Status: DC | PRN
  Filled 2021-03-06: qty 30, 4d supply, fill #0

## 2021-03-06 MED ORDER — SENNA 8.6 MG PO TABS
1.0000 | ORAL_TABLET | Freq: Every day | ORAL | 0 refills | Status: DC | PRN
Start: 2021-03-06 — End: 2021-03-31
  Filled 2021-03-06: qty 14, 14d supply, fill #0

## 2021-03-06 NOTE — Discharge Summary (Signed)
Physician Discharge Summary  Patient ID: Suzanne Cline MRN: [id number] DOB/AGE: [birth year] 55 y.o.  Admit date: 03/04/2021 Discharge date: 03/06/2021  Admission Diagnoses: Large, symptomatic fibroid uterus  Discharge Diagnoses: Status abdominal hysterectomy  Discharged Condition: good  Hospital Course: Patient admitted for a scheduled hysterectomy, and she underwent a TAH/BS with Dr. Rip Harbour. She had an uncomplicated post op course and was discharged to home on POD#2 and was meeting all post op goals, including flatus.   She was kept on all her home medications for her various medical co-morbidities, without issue  Consults:  Physical Therapy (no issues and okay for d/c to home without any equipment or follow up)  Significant Diagnostic Studies:   Surgical pathology pending  CBC Latest Ref Rng & Units 03/05/2021 02/26/2021 11/15/2020  WBC 4.0 - 10.5 K/uL 10.8(H) 6.1 8.5  Hemoglobin 12.0 - 15.0 g/dL 12.6 13.0 13.7  Hematocrit 36.0 - 46.0 % 38.5 42.1 42.3  Platelets 150 - 400 K/uL 229 219 176   CMP Latest Ref Rng & Units 03/06/2021 03/05/2021 02/26/2021  Glucose 70 - 99 mg/dL 123(H) 180(H) 77  BUN 6 - 20 mg/dL 18 11 17   Creatinine 0.44 - 1.00 mg/dL 1.09(H) 1.12(H) 1.30(H)  Sodium 135 - 145 mmol/L 134(L) 134(L) 139  Potassium 3.5 - 5.1 mmol/L 3.7 4.1 4.3  Chloride 98 - 111 mmol/L 99 95(L) 107  CO2 22 - 32 mmol/L 28 28 27   Calcium 8.9 - 10.3 mg/dL 9.1 9.1 9.2  Total Protein 6.0 - 8.5 g/dL - - -  Total Bilirubin 0.0 - 1.2 mg/dL - - -  Alkaline Phos 39 - 117 IU/L - - -  AST 0 - 40 IU/L - - -  ALT 0 - 32 IU/L - - -    Discharge Exam: Blood pressure 139/90, pulse 62, temperature 97.9 F (36.6 C), temperature source Oral, resp. rate 18, height 5' 3"  (1.6 m), weight 76.2 kg, SpO2 97 %. General appearance: alert Resp: clear to auscultation bilaterally Cardio: regular rate and rhythm, S1, S2 normal, no murmur, click, rub or gallop GI: soft, non-tender; bowel sounds normal; no masses,   no organomegaly. Incision c/d/I with sterip strips in place Extremities: extremities normal, atraumatic, no cyanosis or edema Skin: Skin color, texture, turgor normal. No rashes or lesions  Disposition: Home   Allergies as of 03/06/2021       Reactions   Chlorhexidine    Itching,redness   Lisinopril Cough   Oxycodone Itching        Medication List     TAKE these medications    acyclovir 400 MG tablet Commonly known as: ZOVIRAX Take 1 tablet (400 mg total) by mouth 5 (five) times daily. What changed: additional instructions   amLODipine 10 MG tablet Commonly known as: NORVASC Take 1 tablet (10 mg total) by mouth daily.   atorvastatin 40 MG tablet Commonly known as: LIPITOR Take 1 tablet (40 mg total) by mouth daily.   carvedilol 25 MG tablet Commonly known as: COREG Take 1 tablet (25 mg total) by mouth 2 (two) times daily with a meal.   Farxiga 10 MG Tabs tablet Generic drug: dapagliflozin propanediol Take 1 tablet (10 mg total) by mouth every morning.   furosemide 20 MG tablet Commonly known as: LASIX Take 1 tablet (20 mg total) by mouth 2 (two) times daily as needed. What changed: reasons to take this   gabapentin 300 MG capsule Commonly known as: NEURONTIN TAKE 2 CAPSULES (600 MG TOTAL) BY MOUTH 2 (TWO) TIMES  DAILY. What changed:  how much to take when to take this reasons to take this   HYDROmorphone 2 MG tablet Commonly known as: DILAUDID Take 1-2 tablets (2-4 mg total) by mouth every 6 (six) hours as needed for severe pain.   hyoscyamine 0.125 MG tablet Commonly known as: LEVSIN Take 1 tablet (0.125 mg total) by mouth every 4 (four) hours as needed. What changed: reasons to take this   Lantus SoloStar 100 UNIT/ML Solostar Pen Generic drug: insulin glargine Inject 30 Units into the skin at bedtime. What changed: how much to take   metFORMIN 500 MG 24 hr tablet Commonly known as: Glucophage XR Take 2 tablets (1,000 mg total) by mouth daily  with breakfast.   ondansetron 4 MG tablet Commonly known as: ZOFRAN Take 1 tablet (4 mg total) by mouth every 6 (six) hours as needed for nausea.   polyethylene glycol 17 g packet Commonly known as: MIRALAX / GLYCOLAX Take 17 g by mouth daily with supper.   senna 8.6 MG Tabs tablet Commonly known as: SENOKOT Take 1 tablet (8.6 mg total) by mouth daily as needed for mild constipation.   simethicone 80 MG chewable tablet Commonly known as: MYLICON Chew 1 tablet (80 mg total) by mouth 3 (three) times daily before meals for 7 days.   spironolactone 25 MG tablet Commonly known as: ALDACTONE Take 1 tablet (25 mg total) by mouth daily.   Synthroid 150 MCG tablet Generic drug: levothyroxine Take 1 tablet (150 mcg total) by mouth daily.   True Metrix Blood Glucose Test test strip Generic drug: glucose blood Use as instructed   True Metrix Meter w/Device Kit Use as directed   TRUEplus Lancets 28G Misc Use as directed   Trulicity 1.5 NK/5.3ZJ Sopn Generic drug: Dulaglutide Inject 1.5 mg into the skin once a week.   valsartan 320 MG tablet Commonly known as: DIOVAN Take 1 tablet (320 mg total) by mouth daily.        Follow-up [facility] Follow up in 24 day(s).   Specialty: Obstetrics and Gynecology Why: Post op appt with Dr. Gardiner Fanti Contact information: [place] [phone]                Signed: Aletha Halim 03/06/2021, 8:30 AM

## 2021-03-06 NOTE — Progress Notes (Signed)
Discharge instructions provided to patient. Patient verbalizes understanding. Dilaudid medication sent to corrected pharmacy. [facility] removed and belongings in patient procession. Patient discharged

## 2021-03-06 NOTE — Progress Notes (Signed)
GYN Note [facility] states they don't have my DEA # on file. Rx sent to [facility]  Durene Romans MD Attending Center for [place] (Faculty Practice) 03/06/2021 Time: [phone]

## 2021-03-06 NOTE — Progress Notes (Signed)
Mobility Specialist Progress Note:   03/06/21 1110  Mobility  Activity Refused mobility   Pt refused mobility d/t being too tired and son on his way to pick her up.   Nelta Numbers Mobility Specialist  Phone [phone]

## 2021-03-06 NOTE — Progress Notes (Signed)
GYN Telephone Note Initial pharmacy for dilaudid does not supply that medication. Dilaudid sent to [facility].  Durene Romans MD Attending Center for [employer] (Faculty Practice) 03/06/2021 Time: [phone]

## 2021-03-12 ENCOUNTER — Telehealth: Payer: Self-pay

## 2021-03-12 NOTE — Telephone Encounter (Signed)
Transition Care Management Unsuccessful Follow-up Telephone Call  Date of discharge and from where:  03/06/21 from [facility]  Attempts:  1st Attempt  Reason for unsuccessful TCM follow-up call:  Left voice message   Thea Silversmith, RN, MSN, BSN, [place] Care Management Coordinator [phone]

## 2021-03-18 ENCOUNTER — Telehealth: Payer: Self-pay

## 2021-03-18 NOTE — Telephone Encounter (Signed)
Transition Care Management Follow-up Telephone Call Date of discharge and from where: 03/06/21 from [facility] How have you been since you were released from the hospital? "I have been fine, I have been recovering" Any questions or concerns? No  Items Reviewed: Did the pt receive and understand the discharge instructions provided? Yes  Medications obtained and verified? Yes  Other? No  Any new allergies since your discharge? No  Dietary orders reviewed? Yes Do you have support at home? Yes   Home Care and Equipment/Supplies: Were home health services ordered? no If so, what is the name of the agency? Not appicable  Has the agency set up a time to come to the patient's home? not applicable Were any new equipment or medical supplies ordered?  No What is the name of the medical supply agency? Not applicable Were you able to get the supplies/equipment? not applicable Do you have any questions related to the use of the equipment or supplies? No  Functional Questionnaire: (I = Independent and D = Dependent) ADLs:  I  Bathing/Dressing- I  Meal Prep- I  Eating- I  Maintaining continence- I  Transferring/Ambulation- I  Managing Meds- I  Follow up appointments reviewed:  PCP Hospital f/u appt confirmed?  Following up with specialist . [facility] f/u appt confirmed? Yes  Scheduled to see Dr. Arlina Robes on 03/31/21 @ 3:35pm. Are transportation arrangements needed? No  If their condition worsens, is the pt aware to call PCP or go to the Emergency Dept.? Yes Was the patient provided with contact information for the PCP's office or ED? Yes Was to pt encouraged to call back with questions or concerns? Yes    Thea Silversmith, RN, MSN, BSN, [facility] Care Management Coordinator [phone]

## 2021-03-31 ENCOUNTER — Ambulatory Visit (INDEPENDENT_AMBULATORY_CARE_PROVIDER_SITE_OTHER): Payer: Self-pay | Admitting: Obstetrics and Gynecology

## 2021-03-31 ENCOUNTER — Other Ambulatory Visit: Payer: Self-pay

## 2021-03-31 ENCOUNTER — Encounter: Payer: Self-pay | Admitting: Obstetrics and Gynecology

## 2021-03-31 VITALS — BP 129/87 | HR 66 | Ht 63.0 in | Wt 168.0 lb

## 2021-03-31 DIAGNOSIS — Z9889 Other specified postprocedural states: Secondary | ICD-10-CM

## 2021-03-31 DIAGNOSIS — Z90710 Acquired absence of both cervix and uterus: Secondary | ICD-10-CM

## 2021-03-31 NOTE — Progress Notes (Signed)
Suzanne Cline presents for post op appt. TAH with BS on 03/04/21 without problems. Pathology reviewed with pt. Denies any bowel or bladder dysfunction  PE AF VSS Lungs clear Heart RRR Abd soft + BS incision well healed  A/P Post op TAH/BS  Doing well. Return to normal ADL's. F/U with PCP for continued management of medical problems. F/U with me PRN or in 1 yr.

## 2021-03-31 NOTE — Patient Instructions (Signed)

## 2021-04-01 ENCOUNTER — Other Ambulatory Visit: Payer: Self-pay

## 2021-04-01 ENCOUNTER — Encounter: Payer: Self-pay | Admitting: Critical Care Medicine

## 2021-04-01 ENCOUNTER — Ambulatory Visit: Payer: Self-pay | Attending: Critical Care Medicine | Admitting: Critical Care Medicine

## 2021-04-01 DIAGNOSIS — E1142 Type 2 diabetes mellitus with diabetic polyneuropathy: Secondary | ICD-10-CM

## 2021-04-01 DIAGNOSIS — Z794 Long term (current) use of insulin: Secondary | ICD-10-CM

## 2021-04-01 DIAGNOSIS — E119 Type 2 diabetes mellitus without complications: Secondary | ICD-10-CM

## 2021-04-01 DIAGNOSIS — R109 Unspecified abdominal pain: Secondary | ICD-10-CM

## 2021-04-01 DIAGNOSIS — Z9889 Other specified postprocedural states: Secondary | ICD-10-CM

## 2021-04-01 DIAGNOSIS — K2100 Gastro-esophageal reflux disease with esophagitis, without bleeding: Secondary | ICD-10-CM

## 2021-04-01 DIAGNOSIS — I5030 Unspecified diastolic (congestive) heart failure: Secondary | ICD-10-CM

## 2021-04-01 DIAGNOSIS — E785 Hyperlipidemia, unspecified: Secondary | ICD-10-CM

## 2021-04-01 DIAGNOSIS — K200 Eosinophilic esophagitis: Secondary | ICD-10-CM

## 2021-04-01 DIAGNOSIS — I110 Hypertensive heart disease with heart failure: Secondary | ICD-10-CM

## 2021-04-01 DIAGNOSIS — E1169 Type 2 diabetes mellitus with other specified complication: Secondary | ICD-10-CM

## 2021-04-01 DIAGNOSIS — I10 Essential (primary) hypertension: Secondary | ICD-10-CM

## 2021-04-01 DIAGNOSIS — E1165 Type 2 diabetes mellitus with hyperglycemia: Secondary | ICD-10-CM

## 2021-04-01 DIAGNOSIS — Z90710 Acquired absence of both cervix and uterus: Secondary | ICD-10-CM

## 2021-04-01 DIAGNOSIS — E890 Postprocedural hypothyroidism: Secondary | ICD-10-CM

## 2021-04-01 DIAGNOSIS — G8929 Other chronic pain: Secondary | ICD-10-CM

## 2021-04-01 MED ORDER — INSULIN GLARGINE 100 UNITS/ML SOLOSTAR PEN
30.0000 [IU] | PEN_INJECTOR | Freq: Every day | SUBCUTANEOUS | 2 refills | Status: DC
  Filled 2021-04-01: qty 6, 20d supply, fill #0
  Filled 2021-09-04: qty 12, 40d supply, fill #0
  Filled 2021-09-12: qty 9, 30d supply, fill #0
  Filled 2021-11-05: qty 3, 10d supply, fill #1

## 2021-04-01 MED ORDER — CARVEDILOL 25 MG PO TABS: 25.0000 mg | ORAL_TABLET | Freq: Two times a day (BID) | ORAL | 3 refills | Status: DC

## 2021-04-01 MED ORDER — TRULICITY 1.5 MG/0.5ML ~~LOC~~ SOAJ
1.5000 mg | SUBCUTANEOUS | 2 refills | Status: DC
  Filled 2021-04-01: qty 2, 28d supply, fill #0

## 2021-04-01 MED ORDER — SUCRALFATE 1 GM/10ML PO SUSP
1.0000 g | Freq: Three times a day (TID) | ORAL | 1 refills | Status: DC
  Filled 2021-04-01 – 2021-05-09 (×2): qty 420, 11d supply, fill #0
  Filled 2021-05-09: qty 420, 11d supply, fill #1

## 2021-04-01 MED ORDER — SYNTHROID 150 MCG PO TABS
150.0000 ug | ORAL_TABLET | Freq: Every day | ORAL | 3 refills | Status: DC
Start: 2021-04-01 — End: 2021-12-10

## 2021-04-01 MED ORDER — FARXIGA 10 MG PO TABS
10.0000 mg | ORAL_TABLET | Freq: Every morning | ORAL | 2 refills | Status: DC
  Filled 2021-04-01: qty 30, 30d supply, fill #0

## 2021-04-01 MED ORDER — METFORMIN HCL ER 500 MG PO TB24: 1000.0000 mg | ORAL_TABLET | Freq: Every day | ORAL | 1 refills | Status: DC

## 2021-04-01 MED ORDER — HYOSCYAMINE SULFATE 0.125 MG PO TABS
0.1250 mg | ORAL_TABLET | ORAL | 2 refills | Status: DC | PRN
Start: 2021-04-01 — End: 2021-04-07

## 2021-04-01 MED ORDER — SUCRALFATE 1 G PO TABS
1.0000 g | ORAL_TABLET | Freq: Three times a day (TID) | ORAL | 1 refills | Status: DC
  Filled 2021-04-01: qty 120, 30d supply, fill #0

## 2021-04-01 MED ORDER — FUROSEMIDE 20 MG PO TABS: 20.0000 mg | ORAL_TABLET | Freq: Two times a day (BID) | ORAL | 2 refills | Status: DC | PRN

## 2021-04-01 MED ORDER — VALSARTAN 320 MG PO TABS
320.0000 mg | ORAL_TABLET | Freq: Every day | ORAL | 3 refills | Status: DC
  Filled 2021-04-01 – 2021-11-05 (×2): qty 30, 30d supply, fill #0

## 2021-04-01 MED ORDER — SPIRONOLACTONE 25 MG PO TABS: 25.0000 mg | ORAL_TABLET | Freq: Every day | ORAL | 2 refills | Status: DC

## 2021-04-01 MED ORDER — AMLODIPINE BESYLATE 10 MG PO TABS: 10.0000 mg | ORAL_TABLET | Freq: Every day | ORAL | 1 refills | Status: DC

## 2021-04-01 MED ORDER — GABAPENTIN 300 MG PO CAPS
ORAL_CAPSULE | Freq: Two times a day (BID) | ORAL | 1 refills | Status: DC
  Filled 2021-04-01: qty 180, 45d supply, fill #0

## 2021-04-01 NOTE — Addendum Note (Signed)
Addended by: Asencion Noble E on: 04/01/2021 04:18 PM   Modules accepted: Orders

## 2021-04-01 NOTE — Assessment & Plan Note (Signed)
Continue current medications for diabetes

## 2021-04-01 NOTE — Assessment & Plan Note (Signed)
Continue lipid therapy

## 2021-04-01 NOTE — Assessment & Plan Note (Signed)
Continue thyroid replacement

## 2021-04-01 NOTE — Progress Notes (Signed)
Established Patient Office Visit  Subjective:  Patient ID: Suzanne Cline, female    DOB: [birth year]  Age: 55 y.o. MRN: [id number] Virtual Visit via Telephone Note  I connected with Karena Addison on 04/01/21 at  9:00 AM EST by telephone and verified that I am speaking with the correct person using two identifiers.   Consent:  I discussed the limitations, risks, security and privacy concerns of performing an evaluation and management service by telephone and the availability of in person appointments. I also discussed with the patient that there may be a patient responsible charge related to this service. The patient expressed understanding and agreed to proceed.  Location of patient: Patient's at home  Location of provider: I am in the office  Persons participating in the televisit with the patient.   No one else on the call  The visit started out on video however the patient could not hear me she could hear and see me and I could see her clearly but she could not hear my audio I do not know if this is on our end or the patient sent with her cell phone we therefore shifted to an audio telephone visit only for the majority of the visit    History of Present Illness:   CC: Post hospital follow-up  HPI Karena Addison presents for post hospital follow-up.  The patient recently in November underwent total hysterectomy and she states since that time there is been some improvement in her pelvic pain but she continues to have heartburn symptoms fullness in the upper abdominal area with upper abdominal discomfort.  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  She still has short-term disability that is set to run out this week.  She is can apply for unemployment.  Her last day of work was November 14, 2020.  We she did have FMLA but this ran out in October.  She states her blood sugars at home run 95-100.  She had seen gastroenterology in 2021 of  work-up there showed eosinophilic gastritis and esophagitis.  She was recommended to be on Carafate and proton pump inhibitors she is off these at this time.  She notes increased urinary frequency pain in the upper gastrointestinal tract and swelling in the abdomen.  Her abdominal process from the lower abdomen is improving after the hysterectomy for severe uterine fibroids.    Past Medical History:  Diagnosis Date   CHF (congestive heart failure) ([place])    Diabetes mellitus without complication ([place])    Difficult intubation    Family history of adverse reaction to anesthesia    Fibroids, intramural    s/p uterine artery embolization   Former cigarette smoker 04/2011   History of Resolved Nonischemic Congestive Cardiomyopathy ([place]) 04/2011   Echo March 2013: EF 35% with mild/moderate diastolic dysfunction -> Follow-Up Echo March 2014 EF 55% with G1 DD.   Hypertension    Hypertensive heart disease    Difficult control hypertension   Hypothyroidism    Mild concentric left ventricular hypertrophy (LVH) 10/08/2019   Moderate mitral valve regurgitation 10/08/2019   Echo done June 2021; Dr. Ellyn Hack   Neuromuscular disorder [facility])    diabetic neuropathy   OSA (obstructive sleep apnea)    Sleep apnea    doesn't use cpap regularly   Thyroid disease    post thyroidectomy    Past Surgical History:  Procedure Laterality Date   ABLATION     uterus  COLONOSCOPY     2017 -  normal: in [place], [place]     for fibroids   DILATION AND CURETTAGE OF UTERUS     HYSTERECTOMY ABDOMINAL WITH SALPINGECTOMY Bilateral 03/04/2021   Procedure: HYSTERECTOMY ABDOMINAL WITH BILATERAL SALPINGECTOMY;  Surgeon: [name], MD;  Location: [facility];  Service: Gynecology;  Laterality: Bilateral;  TAP BLOCK   LEFT HEART CATH AND CORONARY ANGIOGRAPHY  05/19/2011   ([facility]): Normal coronary arteries   THYROIDECTOMY     TRANSTHORACIC ECHOCARDIOGRAM  07/18/2011    [facility] [place]: EF 35% with mild to moderate DD. -->   TRANSTHORACIC ECHOCARDIOGRAM     [facility] [place]: EF 55%, G1 DD.  LVIDd 4.3; July 12, 2015: EF 55 to 60%.  Normal valves   TUBAL LIGATION     UPPER GASTROINTESTINAL ENDOSCOPY  07/06/2019    Family History  Problem Relation Age of Onset   Congestive Heart Failure Mother    Breast cancer Neg Hx    Colon cancer Neg Hx    Esophageal cancer Neg Hx    Rectal cancer Neg Hx    Stomach cancer Neg Hx    Inflammatory bowel disease Neg Hx    Liver disease Neg Hx    Pancreatic cancer Neg Hx     Social History   Socioeconomic History   Marital status: Single    Spouse name: Not on file   Number of children: 2   Years of education: Not on file   Highest education level: Not on file  Occupational History   Not on file  Tobacco Use   Smoking status: Former    Types: Cigars    Quit date: 03/2019    Years since quitting: 2.0   Smokeless tobacco: Never   Tobacco comments:    smokes occasional cigar  Vaping Use   Vaping Use: Every day   Devices: hemp  Substance and Sexual Activity   Alcohol use: Yes    Comment: social   Drug use: Yes    Types: Marijuana   Sexual activity: Not Currently  Other Topics Concern   Not on file  Social History Narrative   Not on file   Social Determinants of Health   Financial Resource Strain: Not on file  Food Insecurity: No Food Insecurity   Worried About Running Out of Food in the Last Year: Never true   Ran Out of Food in the Last Year: Never true  Transportation Needs: No Transportation Needs   Lack of Transportation (Medical): No   Lack of Transportation (Non-Medical): No  Physical Activity: Not on file  Stress: Not on file  Social Connections: Not on file  Intimate Partner Violence: Not on file    Outpatient Medications Prior to Visit  Medication Sig Dispense Refill   acyclovir (ZOVIRAX) 400 MG tablet Take 1 tablet (400 mg total) by mouth 5 (five) times  daily. 60 tablet 1   Blood Glucose Monitoring Suppl (TRUE METRIX METER) w/Device KIT Use as directed 1 kit 0   glucose blood (TRUE METRIX BLOOD GLUCOSE TEST) test strip Use as instructed 100 each 12   TRUEplus Lancets 28G MISC Use as directed 100 each 4   amLODipine (NORVASC) 10 MG tablet Take 1 tablet (10 mg total) by mouth daily. 90 tablet 1   carvedilol (COREG) 25 MG tablet Take 1 tablet (25 mg total) by mouth 2 (two) times daily with a meal. 180 tablet 0   Dulaglutide (TRULICITY)  1.5 MG/0.5ML SOPN Inject 1.5 mg into the skin once a week. 2 mL 2   FARXIGA 10 MG TABS tablet Take 1 tablet (10 mg total) by mouth every morning. 30 tablet 2   furosemide (LASIX) 20 MG tablet Take 1 tablet (20 mg total) by mouth 2 (two) times daily as needed. 60 tablet 2   gabapentin (NEURONTIN) 300 MG capsule TAKE 2 CAPSULES (600 MG TOTAL) BY MOUTH 2 (TWO) TIMES DAILY. (Patient taking differently: Take 300-600 mg by mouth 2 (two) times daily as needed (pain).) 180 capsule 1   hyoscyamine (LEVSIN) 0.125 MG tablet Take 1 tablet (0.125 mg total) by mouth every 4 (four) hours as needed. (Patient taking differently: Take 0.125 mg by mouth every 4 (four) hours as needed for cramping.) 30 tablet 2   insulin glargine (LANTUS) 100 unit/mL SOPN Inject 30 Units into the skin at bedtime. (Patient taking differently: Inject 16-20 Units into the skin at bedtime.) 6 mL 2   metFORMIN (GLUCOPHAGE XR) 500 MG 24 hr tablet Take 2 tablets (1,000 mg total) by mouth daily with breakfast. 180 tablet 1   spironolactone (ALDACTONE) 25 MG tablet Take 1 tablet (25 mg total) by mouth daily. 30 tablet 2   SYNTHROID 150 MCG tablet Take 1 tablet (150 mcg total) by mouth daily. 90 tablet 0   valsartan (DIOVAN) 320 MG tablet Take 1 tablet (320 mg total) by mouth daily. 90 tablet 3   No facility-administered medications prior to visit.    Allergies  Allergen Reactions   Chlorhexidine     Itching,redness   Lisinopril Cough   Oxycodone Itching     ROS Review of Systems  Constitutional: Negative.   HENT: Negative.  Negative for ear pain, postnasal drip, rhinorrhea, sinus pressure, sore throat, trouble swallowing and voice change.   Eyes: Negative.   Respiratory: Negative.  Negative for apnea, cough, choking, chest tightness, shortness of breath, wheezing and stridor.   Cardiovascular: Negative.  Negative for chest pain, palpitations and leg swelling.  Gastrointestinal:  Positive for abdominal distention and abdominal pain. Negative for nausea and vomiting.       Reflux symptoms  Genitourinary: Negative.   Musculoskeletal: Negative.  Negative for arthralgias and myalgias.  Skin: Negative.  Negative for rash.  Allergic/Immunologic: Negative.  Negative for environmental allergies and food allergies.  Neurological: Negative.  Negative for dizziness, syncope, weakness and headaches.  Hematological: Negative.  Negative for adenopathy. Does not bruise/bleed easily.  Psychiatric/Behavioral: Negative.  Negative for agitation and sleep disturbance. The patient is not nervous/anxious.      Objective:    Physical Exam No exam this is a phone visit There were no vitals taken for this visit. Wt Readings from Last 3 Encounters:  03/31/21 168 lb (76.2 kg)  03/04/21 168 lb (76.2 kg)  02/26/21 169 lb 1.6 oz (76.7 kg)     Health Maintenance Due  Topic Date Due   COVID-19 Vaccine (1) Never done   MAMMOGRAM  03/29/2021    There are no preventive care reminders to display for this patient.  Lab Results  Component Value Date   TSH 3.590 11/14/2019   Lab Results  Component Value Date   WBC 10.8 (H) 03/05/2021   HGB 12.6 03/05/2021   HCT 38.5 03/05/2021   MCV 86.3 03/05/2021   PLT 229 03/05/2021   Lab Results  Component Value Date   NA 134 (L) 03/06/2021   K 3.7 03/06/2021   CO2 28 03/06/2021   GLUCOSE 123 (H) 03/06/2021  BUN 18 03/06/2021   CREATININE 1.09 (H) 03/06/2021   BILITOT 0.3 09/01/2019   ALKPHOS 70 09/01/2019    AST 12 09/01/2019   ALT 9 09/01/2019   PROT 7.1 09/01/2019   ALBUMIN 4.6 09/01/2019   CALCIUM 9.1 03/06/2021   ANIONGAP 7 03/06/2021   No results found for: CHOL No results found for: HDL No results found for: LDLCALC No results found for: TRIG No results found for: CHOLHDL Lab Results  Component Value Date   HGBA1C 8.1 (A) 01/21/2021      Assessment & Plan:   Problem List Items Addressed This Visit       Cardiovascular and Mediastinum   Hypertensive heart disease with diastolic heart failure (HCC) (Chronic)    Stable blood pressure at this time continue current medications      Relevant Medications   carvedilol (COREG) 25 MG tablet   furosemide (LASIX) 20 MG tablet   amLODipine (NORVASC) 10 MG tablet   spironolactone (ALDACTONE) 25 MG tablet   valsartan (DIOVAN) 320 MG tablet   Essential hypertension (Chronic)    As per heart failure assessment      Relevant Medications   carvedilol (COREG) 25 MG tablet   furosemide (LASIX) 20 MG tablet   amLODipine (NORVASC) 10 MG tablet   spironolactone (ALDACTONE) 25 MG tablet   valsartan (DIOVAN) 320 MG tablet     Digestive   GERD (gastroesophageal reflux disease)    Esophageal eosinophilia and reflux symptoms persist will refill Nexium and Carafate and refer back to gastroenterology      Relevant Medications   hyoscyamine (LEVSIN) 0.125 MG tablet     Endocrine   Type 2 diabetes mellitus without complication, with long-term current use of insulin (HCC) (Chronic)    Continue current medications for diabetes      Relevant Medications   metFORMIN (GLUCOPHAGE XR) 500 MG 24 hr tablet   Dulaglutide (TRULICITY) 1.5 KW/4.0XB SOPN   FARXIGA 10 MG TABS tablet   insulin glargine (LANTUS) 100 unit/mL SOPN   valsartan (DIOVAN) 320 MG tablet   Hyperlipidemia associated with type 2 diabetes mellitus (HCC) (Chronic)    Continue lipid therapy      Relevant Medications   carvedilol (COREG) 25 MG tablet   furosemide (LASIX)  20 MG tablet   amLODipine (NORVASC) 10 MG tablet   metFORMIN (GLUCOPHAGE XR) 500 MG 24 hr tablet   spironolactone (ALDACTONE) 25 MG tablet   Dulaglutide (TRULICITY) 1.5 DZ/3.2DJ SOPN   FARXIGA 10 MG TABS tablet   insulin glargine (LANTUS) 100 unit/mL SOPN   valsartan (DIOVAN) 320 MG tablet   Postoperative hypothyroidism    Continue thyroid replacement      Relevant Medications   SYNTHROID 150 MCG tablet   carvedilol (COREG) 25 MG tablet   Diabetic polyneuropathy associated with type 2 diabetes mellitus (HCC)   Relevant Medications   metFORMIN (GLUCOPHAGE XR) 500 MG 24 hr tablet   Dulaglutide (TRULICITY) 1.5 ME/2.6ST SOPN   FARXIGA 10 MG TABS tablet   gabapentin (NEURONTIN) 300 MG capsule   insulin glargine (LANTUS) 100 unit/mL SOPN   valsartan (DIOVAN) 320 MG tablet     Other   Post-operative state    Postoperative state slowly recovering      History of abdominal hysterectomy    Successful surgical intervention with total abdominal hysterectomy and bilateral salpingoectomy      Other Visit Diagnoses     Type 2 diabetes mellitus with hyperglycemia, without long-term current use of insulin (HCC)  Relevant Medications   metFORMIN (GLUCOPHAGE XR) 500 MG 24 hr tablet   Dulaglutide (TRULICITY) 1.5 QG/9.2EF SOPN   FARXIGA 10 MG TABS tablet   insulin glargine (LANTUS) 100 unit/mL SOPN   valsartan (DIOVAN) 320 MG tablet       Meds ordered this encounter  Medications   SYNTHROID 150 MCG tablet    Sig: Take 1 tablet (150 mcg total) by mouth daily.    Dispense:  90 tablet    Refill:  3   carvedilol (COREG) 25 MG tablet    Sig: Take 1 tablet (25 mg total) by mouth 2 (two) times daily with a meal.    Dispense:  180 tablet    Refill:  3   furosemide (LASIX) 20 MG tablet    Sig: Take 1 tablet (20 mg total) by mouth 2 (two) times daily as needed.    Dispense:  60 tablet    Refill:  2   amLODipine (NORVASC) 10 MG tablet    Sig: Take 1 tablet (10 mg total) by mouth  daily.    Dispense:  90 tablet    Refill:  1   metFORMIN (GLUCOPHAGE XR) 500 MG 24 hr tablet    Sig: Take 2 tablets (1,000 mg total) by mouth daily with breakfast.    Dispense:  180 tablet    Refill:  1    PASS   spironolactone (ALDACTONE) 25 MG tablet    Sig: Take 1 tablet (25 mg total) by mouth daily.    Dispense:  30 tablet    Refill:  2   hyoscyamine (LEVSIN) 0.125 MG tablet    Sig: Take 1 tablet (0.125 mg total) by mouth every 4 (four) hours as needed for cramping.    Dispense:  60 tablet    Refill:  2   Dulaglutide (TRULICITY) 1.5 EO/7.1QR SOPN    Sig: Inject 1.5 mg into the skin once a week.    Dispense:  2 mL    Refill:  2   FARXIGA 10 MG TABS tablet    Sig: Take 1 tablet (10 mg total) by mouth every morning.    Dispense:  30 tablet    Refill:  2    Needs PASS   gabapentin (NEURONTIN) 300 MG capsule    Sig: TAKE 2 CAPSULES (600 MG TOTAL) BY MOUTH 2 (TWO) TIMES DAILY.    Dispense:  180 capsule    Refill:  1   insulin glargine (LANTUS) 100 unit/mL SOPN    Sig: Inject 30 Units into the skin at bedtime.    Dispense:  6 mL    Refill:  2   valsartan (DIOVAN) 320 MG tablet    Sig: Take 1 tablet (320 mg total) by mouth daily.    Dispense:  90 tablet    Refill:  3   Follow Up Instructions: Patient knows a referral to gastroenterology be made, she is to call for mammogram through the patient assistance program, she was given information as to how to access mental health services at the behavioral health center, medications were refilled, she is to follow-up with me in 2 months in the office face-to-face   I discussed the assessment and treatment plan with the patient. The patient was provided an opportunity to ask questions and all were answered. The patient agreed with the plan and demonstrated an understanding of the instructions.   The patient was advised to call back or seek an in-person evaluation if the symptoms worsen or if  the condition fails to improve as  anticipated.  I provided 30 minutes of non-face-to-face time during this encounter  including  median intraservice time , review of notes, labs, imaging, medications  and explaining diagnosis and management to the patient .    Asencion Noble, MD

## 2021-04-01 NOTE — Assessment & Plan Note (Signed)
Successful surgical intervention with total abdominal hysterectomy and bilateral salpingoectomy

## 2021-04-01 NOTE — Assessment & Plan Note (Signed)
Esophageal eosinophilia and reflux symptoms persist will refill Nexium and Carafate and refer back to gastroenterology

## 2021-04-01 NOTE — Assessment & Plan Note (Signed)
Stable blood pressure at this time continue current medications

## 2021-04-01 NOTE — Patient Instructions (Signed)
Call the cancer control program to apply for free mammogram and pap smear, they will order the studies once approved The number is:   [phone]

## 2021-04-01 NOTE — Assessment & Plan Note (Signed)
Postoperative state slowly recovering

## 2021-04-01 NOTE — Assessment & Plan Note (Signed)
As per heart failure assessment

## 2021-04-02 ENCOUNTER — Other Ambulatory Visit: Payer: Self-pay

## 2021-04-04 ENCOUNTER — Other Ambulatory Visit: Payer: Self-pay

## 2021-04-06 ENCOUNTER — Encounter: Payer: Self-pay | Admitting: Critical Care Medicine

## 2021-04-07 ENCOUNTER — Other Ambulatory Visit: Payer: Self-pay

## 2021-04-07 ENCOUNTER — Other Ambulatory Visit: Payer: Self-pay | Admitting: Critical Care Medicine

## 2021-04-07 MED ORDER — HYOSCYAMINE SULFATE 0.125 MG PO TABS
0.1250 mg | ORAL_TABLET | ORAL | 2 refills | Status: DC | PRN
  Filled 2021-04-07 – 2021-05-09 (×2): qty 60, 10d supply, fill #0
  Filled 2021-07-11: qty 60, 10d supply, fill #1

## 2021-04-11 ENCOUNTER — Other Ambulatory Visit: Payer: Self-pay

## 2021-04-14 ENCOUNTER — Encounter: Payer: Self-pay | Admitting: Critical Care Medicine

## 2021-04-14 ENCOUNTER — Other Ambulatory Visit: Payer: Self-pay

## 2021-05-08 ENCOUNTER — Encounter: Payer: Self-pay | Admitting: Obstetrics and Gynecology

## 2021-05-09 ENCOUNTER — Other Ambulatory Visit: Payer: Self-pay

## 2021-05-17 ENCOUNTER — Encounter: Payer: Self-pay | Admitting: Radiology

## 2021-05-25 ENCOUNTER — Encounter: Payer: Self-pay | Admitting: Critical Care Medicine

## 2021-05-25 DIAGNOSIS — R1013 Epigastric pain: Secondary | ICD-10-CM

## 2021-05-25 DIAGNOSIS — K200 Eosinophilic esophagitis: Secondary | ICD-10-CM

## 2021-05-30 ENCOUNTER — Other Ambulatory Visit: Payer: Self-pay

## 2021-06-03 ENCOUNTER — Ambulatory Visit (INDEPENDENT_AMBULATORY_CARE_PROVIDER_SITE_OTHER): Payer: Self-pay | Admitting: Physician Assistant

## 2021-06-03 ENCOUNTER — Encounter: Payer: Self-pay | Admitting: Physician Assistant

## 2021-06-03 VITALS — BP 118/60 | HR 95 | Ht 63.0 in | Wt 172.0 lb

## 2021-06-03 DIAGNOSIS — R194 Change in bowel habit: Secondary | ICD-10-CM

## 2021-06-03 DIAGNOSIS — R1013 Epigastric pain: Secondary | ICD-10-CM

## 2021-06-03 DIAGNOSIS — R140 Abdominal distension (gaseous): Secondary | ICD-10-CM

## 2021-06-03 NOTE — Progress Notes (Signed)
Chief Complaint: Abdominal pain  HPI:    Suzanne Cline is a 56 year old female with past medical history as listed below, known to Dr. Rush Landmark, who presents clinic today for follow-up of abdominal pain.    12/23/2015 colonoscopy with diverticulosis and otherwise normal.  Repeat recommended 10 years.    10/13/2019 EGD with no gross lesions in the esophagus, 2 erosions in the stomach and no gross lesions in the duodenum.  Patient was given a one-time PPI switch to see if any difference is noted in some of her symptoms then transition to Nexium daily.  Also recommended Carafate recommended twice daily for 2 weeks and then once daily for 2 weeks and then stop.  It was discussed that patient's etiology of bandlike discomfort seem less likely to be GI in origin rather musculoskeletal.  Recommend considering SIBO breath test in the future for bloating.  Pathology showed mucosa with vascular congestion and patient was told to stay on Pepcid 40 mg daily and add Gaviscon for breakthrough symptoms.    04/01/2021 patient had follow-up visit with Dr. Joya Gaskins with pulmonary.  At that time discussed that her pelvic pain had gotten some better after hysterectomy in November but she continued with heartburn symptoms and fullness in the upper abdomen.  She was continued on Levsin.    Today, the patient presents to clinic and tells me that no one has ever treated what is actually going on with her.  She continues with bloating and swelling of her abdomen as well as some pain towards the left upper quadrant and epigastrium.  She describes a tightening in this area that feels like a "rope pulling in or a hammer resting there", this seems to just come on on its own and can even happen when she is laying in bed.  This often radiates through into her back and the only things that seem to help are heating pads which she has on almost constantly as she works from home and sometimes a waist trainer.  She was recently given some  Hyoscyamine by another provider and tells me this helps a little bit but does not take care of the core problem.  Tells me she also noticed a change in bowel habits about a week ago and was passing some mucus with her stools which were more loose than normal as her typical constant is constipation.  She is worried that something else is going on.  She has continued her Carafate but does not feel like it does much.    Denies fever, chills, weight loss, blood in her stool or symptoms that awaken her from sleep.  Past Medical History:  Diagnosis Date   CHF (congestive heart failure) ([place])    Diabetes mellitus without complication ([place])    Difficult intubation    Family history of adverse reaction to anesthesia    Fibroids, intramural    s/p uterine artery embolization   Former cigarette smoker 04/2011   History of Resolved Nonischemic Congestive Cardiomyopathy ([place]) 04/2011   Echo March 2013: EF 35% with mild/moderate diastolic dysfunction -> Follow-Up Echo March 2014 EF 55% with G1 DD.   Hypertension    Hypertensive heart disease    Difficult control hypertension   Hypothyroidism    Mild concentric left ventricular hypertrophy (LVH) 10/08/2019   Moderate mitral valve regurgitation 10/08/2019   Echo done June 2021; Dr. Ellyn Hack   Neuromuscular disorder [facility])    diabetic neuropathy   OSA (obstructive sleep apnea)    Sleep  apnea    doesn't use cpap regularly   Thyroid disease    post thyroidectomy    Past Surgical History:  Procedure Laterality Date   ABLATION     uterus   COLONOSCOPY     2017 -  normal: in [place], [place]     for fibroids   DILATION AND CURETTAGE OF UTERUS     HYSTERECTOMY ABDOMINAL WITH SALPINGECTOMY Bilateral 03/04/2021   Procedure: HYSTERECTOMY ABDOMINAL WITH BILATERAL SALPINGECTOMY;  Surgeon: [name], MD;  Location: [place];  Service: Gynecology;  Laterality: Bilateral;  TAP BLOCK   LEFT HEART CATH AND CORONARY ANGIOGRAPHY   05/19/2011   ([facility]): Normal coronary arteries   THYROIDECTOMY     TRANSTHORACIC ECHOCARDIOGRAM  07/18/2011   [facility] [place]: EF 35% with mild to moderate DD. -->   TRANSTHORACIC ECHOCARDIOGRAM     [facility] [place]: EF 55%, G1 DD.  LVIDd 4.3; July 12, 2015: EF 55 to 60%.  Normal valves   TUBAL LIGATION     UPPER GASTROINTESTINAL ENDOSCOPY  07/06/2019    Current Outpatient Medications  Medication Sig Dispense Refill   acyclovir (ZOVIRAX) 400 MG tablet Take 1 tablet (400 mg total) by mouth 5 (five) times daily. 60 tablet 1   amLODipine (NORVASC) 10 MG tablet Take 1 tablet (10 mg total) by mouth daily. 90 tablet 1   atorvastatin (LIPITOR) 40 MG tablet Take 40 mg by mouth daily.     Blood Glucose Monitoring Suppl (TRUE METRIX METER) w/Device KIT Use as directed 1 kit 0   carvedilol (COREG) 25 MG tablet Take 1 tablet (25 mg total) by mouth 2 (two) times daily with a meal. 180 tablet 3   Dulaglutide (TRULICITY) 1.5 RJ/1.8AC SOPN Inject 1.5 mg into the skin once a week. 2 mL 2   FARXIGA 10 MG TABS tablet Take 1 tablet (10 mg total) by mouth every morning. 30 tablet 2   furosemide (LASIX) 20 MG tablet Take 1 tablet (20 mg total) by mouth 2 (two) times daily as needed. 60 tablet 2   gabapentin (NEURONTIN) 300 MG capsule TAKE 2 CAPSULES (600 MG TOTAL) BY MOUTH 2 (TWO) TIMES DAILY. 180 capsule 1   glucose blood (TRUE METRIX BLOOD GLUCOSE TEST) test strip Use as instructed 100 each 12   hyoscyamine (LEVSIN) 0.125 MG tablet Take 1 tablet (0.125 mg total) by mouth every 4 (four) hours as needed for cramping. 60 tablet 2   insulin glargine (LANTUS) 100 unit/mL SOPN Inject 30 Units into the skin at bedtime. 6 mL 2   metFORMIN (GLUCOPHAGE XR) 500 MG 24 hr tablet Take 2 tablets (1,000 mg total) by mouth daily with breakfast. 180 tablet 1   spironolactone (ALDACTONE) 25 MG tablet Take 1 tablet (25 mg total) by mouth daily. 30 tablet 2   sucralfate (CARAFATE) 1  GM/10ML suspension Take 10 mLs (1 g total) by mouth 4 (four) times daily -  with meals and at bedtime. 420 mL 1   SYNTHROID 150 MCG tablet Take 1 tablet (150 mcg total) by mouth daily. 90 tablet 3   TRUEplus Lancets 28G MISC Use as directed 100 each 4   valsartan (DIOVAN) 320 MG tablet Take 1 tablet (320 mg total) by mouth daily. 90 tablet 3   No current facility-administered medications for this visit.    Allergies as of 06/03/2021 - Review Complete 04/01/2021  Allergen Reaction Noted   Chlorhexidine  03/04/2021   Lisinopril Cough 03/08/2019  Oxycodone Itching 12/04/2017    Family History  Problem Relation Age of Onset   Congestive Heart Failure Mother    Breast cancer Neg Hx    Colon cancer Neg Hx    Esophageal cancer Neg Hx    Rectal cancer Neg Hx    Stomach cancer Neg Hx    Inflammatory bowel disease Neg Hx    Liver disease Neg Hx    Pancreatic cancer Neg Hx     Social History   Socioeconomic History   Marital status: Single    Spouse name: Not on file   Number of children: 2   Years of education: Not on file   Highest education level: Not on file  Occupational History   Not on file  Tobacco Use   Smoking status: Former    Types: Cigars    Quit date: 03/2019    Years since quitting: 2.1   Smokeless tobacco: Never   Tobacco comments:    smokes occasional cigar  Vaping Use   Vaping Use: Every day   Devices: hemp  Substance and Sexual Activity   Alcohol use: Yes    Comment: social   Drug use: Yes    Types: Marijuana   Sexual activity: Not Currently  Other Topics Concern   Not on file  Social History Narrative   Not on file   Social Determinants of Health   Financial Resource Strain: Not on file  Food Insecurity: No Food Insecurity   Worried About Running Out of Food in the Last Year: Never true   Ran Out of Food in the Last Year: Never true  Transportation Needs: No Transportation Needs   Lack of Transportation (Medical): No   Lack of  Transportation (Non-Medical): No  Physical Activity: Not on file  Stress: Not on file  Social Connections: Not on file  Intimate Partner Violence: Not on file    Review of Systems:    Constitutional: No weight loss, fever or chills Cardiovascular: No chest pain Respiratory: No SOB  Gastrointestinal: See HPI and otherwise negative   Physical Exam:  Vital signs: BP 118/60    Pulse 95    Ht 5' 3" (1.6 m)    Wt 172 lb (78 kg)    BMI 30.47 kg/m    Constitutional:   Pleasant AA female appears to be in NAD, Well developed, Well nourished, alert and cooperative Respiratory: Respirations even and unlabored. Lungs clear to auscultation bilaterally.   No wheezes, crackles, or rhonchi.  Cardiovascular: Normal S1, S2. No MRG. Regular rate and rhythm. No peripheral edema, cyanosis or pallor.  Gastrointestinal:  Soft, nondistended,mild epigastric ttp No rebound or guarding. Normal bowel sounds. No appreciable masses or hepatomegaly. Rectal:  Not performed.  Psychiatric: Oriented to person, place and time. Demonstrates good judgement and reason without abnormal affect or behaviors.  RELEVANT LABS AND IMAGING: CBC    Component Value Date/Time   WBC 10.8 (H) 03/05/2021 0132   RBC 4.46 03/05/2021 0132   HGB 12.6 03/05/2021 0132   HGB 13.7 09/01/2019 1629   HCT 38.5 03/05/2021 0132   HCT 41.3 09/01/2019 1629   PLT 229 03/05/2021 0132   PLT 236 09/01/2019 1629   MCV 86.3 03/05/2021 0132   MCV 88 09/01/2019 1629   MCH 28.3 03/05/2021 0132   MCHC 32.7 03/05/2021 0132   RDW 13.9 03/05/2021 0132   RDW 13.3 09/01/2019 1629   LYMPHSABS 2.8 09/01/2019 1629   EOSABS 0.2 09/01/2019 1629   BASOSABS 0.0 09/01/2019  1629    CMP     Component Value Date/Time   NA 134 (L) 03/06/2021 0103   NA 142 09/01/2019 1629   K 3.7 03/06/2021 0103   CL 99 03/06/2021 0103   CO2 28 03/06/2021 0103   GLUCOSE 123 (H) 03/06/2021 0103   BUN 18 03/06/2021 0103   BUN 10 09/01/2019 1629   CREATININE 1.09 (H)  03/06/2021 0103   CALCIUM 9.1 03/06/2021 0103   PROT 7.1 09/01/2019 1629   ALBUMIN 4.6 09/01/2019 1629   AST 12 09/01/2019 1629   ALT 9 09/01/2019 1629   ALKPHOS 70 09/01/2019 1629   BILITOT 0.3 09/01/2019 1629   GFRNONAA 60 (L) 03/06/2021 0103   GFRAA 76 09/01/2019 1629    Assessment: 1.  Epigastric discomfort: Continues with a "tightening" feeling helped by heating pad, Dr. Rush Landmark thought possibly musculoskeletal in nature or SIBO, could also be functional 2.  Change in bowel habits: With above 3.  Bloating: Consider relation to SIBO  Plan: 1.  Discussed with patient that Dr. Rush Landmark had wanted to do SIBO testing on her.  I think this is a good next step given all of her symptoms.  Ordered SIBO breath test today. 2.  Also discussed the possibility of IBS given that Hyoscyamine is helping.  Discussed that she can use this up to 4 times a day if this seems to be working.  Patient admits that she does have a lot of stress and anxiety.  This may need to be discussed further in the future if SIBO testing is negative. 3.  Patient to follow in clinic per recommendations after testing above.  Ellouise Newer, PA-C [facility] 06/03/2021, 9:39 AM  Cc: [name], MD

## 2021-06-03 NOTE — Progress Notes (Signed)
Attending Physician's Attestation   I have reviewed the chart.   I agree with the Advanced Practitioner's note, impression, and recommendations with any updates as below.    Madinah Quarry Mansouraty, MD [facility] Advanced Endoscopy Office # [phone]  

## 2021-06-03 NOTE — Patient Instructions (Signed)
You have been given a testing kit to check for small intestine bacterial overgrowth (SIBO) which is completed by a company named [employer].   Make sure to return your test in the mail using the return mailing label given to you along with the kit. Your demographic and insurance information have already been sent to the company and they should be in contact with you over the next week regarding this test.   [facility] will collect an upfront charge of $99.74 for commercial insurance plans and $209.74 is you are paying cash. Make sure to discuss with [facility] PRIOR to having the test if they have gotten informatoin from your insurance company as to how much your testing will cost out of pocket, if any. Please keep in mind that you will be getting a call from phone number [phone] or a similar number. If you do not hear from them within this time frame, please call our office at [phone].   BMI:  If you are age 55 or older, your body mass index should be between 23-30. Your Body mass index is 30.47 kg/m. If this is out of the aforementioned range listed, please consider follow up with your Primary Care Provider.  If you are age 48 or younger, your body mass index should be between 19-25. Your Body mass index is 30.47 kg/m. If this is out of the aformentioned range listed, please consider follow up with your Primary Care Provider.   MY CHART:  The [facility] providers would like to encourage you to use [facility] to communicate with providers for non-urgent requests or questions.  Due to long hold times on the telephone, sending your provider a message by [facility] may be a faster and more efficient way to get a response.  Please allow 48 business hours for a response.  Please remember that this is for non-urgent requests.   Thank you for trusting me with your gastrointestinal care!    Ellouise Newer, [place]

## 2021-06-24 NOTE — Telephone Encounter (Signed)
JLL, Not sure about the fasting for a week, and that was put in but in any case, I am not sure that she will be able to afford rifaximin therapy (14 days) but we could try to start with that.  If not a 10-day course of Flagyl (500 mg twice daily) or Augmentin (875 milligrams twice daily) could be considered as long as she understands that we are doing this empirically and if she continues to have symptoms we may still need to do breath testing in the future.  With her having undergone her uterine resection, as well and it has been almost 2 years since any cross-sectional imaging, that may need to be considered as well in the future though with her not having insurance things will be more difficult. Thanks. GM

## 2021-06-25 MED ORDER — METRONIDAZOLE 500 MG PO TABS: 500.0000 mg | ORAL_TABLET | Freq: Two times a day (BID) | ORAL | 0 refills | Status: DC

## 2021-07-11 ENCOUNTER — Other Ambulatory Visit: Payer: Self-pay

## 2021-07-14 ENCOUNTER — Other Ambulatory Visit: Payer: Self-pay

## 2021-07-17 MED ORDER — METRONIDAZOLE 500 MG PO TABS: 500.0000 mg | ORAL_TABLET | Freq: Two times a day (BID) | ORAL | 0 refills | Status: DC

## 2021-07-17 NOTE — Telephone Encounter (Signed)
We can try Flagyl one more time, but then needs follow up with Dr Rush Landmark of myself.  Thanks-JLL  ? ?RX for Flagyl has been sent to [facility]. Patient has been scheduled for a f/u appt with Ellouise Newer, PA-C on Tuesday, 07/29/21 at 9 am. Pt notified via my chart. ?

## 2021-07-29 ENCOUNTER — Other Ambulatory Visit: Payer: Self-pay | Admitting: Critical Care Medicine

## 2021-07-29 ENCOUNTER — Ambulatory Visit (INDEPENDENT_AMBULATORY_CARE_PROVIDER_SITE_OTHER): Payer: Self-pay | Admitting: Physician Assistant

## 2021-07-29 ENCOUNTER — Encounter: Payer: Self-pay | Admitting: Physician Assistant

## 2021-07-29 ENCOUNTER — Other Ambulatory Visit: Payer: Self-pay

## 2021-07-29 VITALS — BP 136/88 | HR 60 | Ht 63.0 in | Wt 171.4 lb

## 2021-07-29 DIAGNOSIS — R1013 Epigastric pain: Secondary | ICD-10-CM

## 2021-07-29 DIAGNOSIS — R140 Abdominal distension (gaseous): Secondary | ICD-10-CM

## 2021-07-29 NOTE — Progress Notes (Signed)
? ?Chief Complaint: Follow-up abdominal pain and bloating ? ?HPI: ?   Suzanne Cline is a 56 year old female with a past medical history as listed below, known to Dr. Rush Landmark, who returns to clinic today for follow-up of abdominal pain and bloating. ?   12/23/2015 colonoscopy with diverticulosis and otherwise normal.  Repeat recommended 10 years. ?   10/13/2019 EGD with no gross lesions in the esophagus, 2 erosions in the stomach and no gross lesions in the duodenum.  Patient was given a one-time PPI switch to see if any difference is noted in some of her symptoms then transition to Nexium daily.  Also recommended Carafate recommended twice daily for 2 weeks and then once daily for 2 weeks and then stop.  It was discussed that patient's etiology of bandlike discomfort seem less likely to be GI in origin rather musculoskeletal.  Recommend considering SIBO breath test in the future for bloating.  Pathology showed mucosa with vascular congestion and patient was told to stay on Pepcid 40 mg daily and add Gaviscon for breakthrough symptoms. ?   04/01/2021 patient had follow-up visit with Dr. Joya Gaskins with pulmonary.  At that time discussed that her pelvic pain had gotten some better after hysterectomy in November but she continued with heartburn symptoms and fullness in the upper abdomen.  She was continued on Levsin. ?   06/03/2021 patient seen in clinic and described that she continues with bloating and swelling of her abdomen as well as some pain towards the left upper quadrant and epigastrium.  Describes using heating pads and a waist trainer to help.  Previously Dr. Rush Landmark had thought possibly musculoskeletal nature of epigastric pain versus SIBO or functional.  At that time ordered SIBO breath testing.  Also discussed possibility of IBS.  Also recommended increasing Hyoscyamine up to 4 times a day. ?   06/22/2021 patient discussed that she could not complete the fasting for SIBO testing.  She was empirically treated  with Flagyl 500 twice daily for 10 days.  Also discussed possible CT in the future given her uterine resection in the past. ?   07/16/2021 patient requested a refill of Flagyl.  This was refilled 1 additional time for her and it was recommended she come in for an appointment. ?   Today, the patient is still on her second round of Flagyl but tells me that all of her symptoms feel a lot better.  She is no longer experiencing the tightening feeling that she was in her epigastrium and her stools are no longer loose and floaty and are more normal.  She does tell me that she still has bloating occasionally and would like to know what is really going on, but also only works part-time and "I do not have full insurance".  Tells me that she is also started other measures that are more holistic as far as doing a ginger supplement and taking a digestive enzyme before eating which she thinks may help.  Also eating more raw food and trying to alter her diet to avoid symptoms.  Overall she feels a lot better. ?   Does continue to describe what feels like a muscle pull occasionally when she changes positions across her epigastrium.  She is not sure what that is. ?   Denies fever, chills, weight loss or blood in her stool. ? ?Past Medical History:  ?Diagnosis Date  ? CHF (congestive heart failure) ([place])   ? Diabetes mellitus without complication ([place])   ? Difficult intubation   ?  Family history of adverse reaction to anesthesia   ? Fibroids, intramural   ? s/p uterine artery embolization  ? Former cigarette smoker 04/2011  ? History of Resolved Nonischemic Congestive Cardiomyopathy ([place]) 04/2011  ? Echo March 2013: EF 35% with mild/moderate diastolic dysfunction -> Follow-Up Echo March 2014 EF 55% with G1 DD.  ? Hypertension   ? Hypertensive heart disease   ? Difficult control hypertension  ? Hypothyroidism   ? Mild concentric left ventricular hypertrophy (LVH) 10/08/2019  ? Moderate mitral valve regurgitation 10/08/2019  ? Echo done  June 2021; Dr. Ellyn Hack  ? Neuromuscular disorder ([place])   ? diabetic neuropathy  ? OSA (obstructive sleep apnea)   ? Sleep apnea   ? doesn't use cpap regularly  ? Thyroid disease   ? post thyroidectomy  ? ? ?Past Surgical History:  ?Procedure Laterality Date  ? ABLATION    ? uterus  ? COLONOSCOPY    ? 2017 -  normal: in [place], [place]   ? DIAGNOSTIC LAPAROSCOPY    ? for fibroids  ? DILATION AND CURETTAGE OF UTERUS    ? HYSTERECTOMY ABDOMINAL WITH SALPINGECTOMY Bilateral 03/04/2021  ? Procedure: HYSTERECTOMY ABDOMINAL WITH BILATERAL SALPINGECTOMY;  Surgeon: [name], MD;  Location: [place];  Service: Gynecology;  Laterality: Bilateral;  TAP BLOCK  ? LEFT HEART CATH AND CORONARY ANGIOGRAPHY  05/19/2011  ? ([facility]): Normal coronary arteries  ? THYROIDECTOMY    ? TRANSTHORACIC ECHOCARDIOGRAM  07/18/2011  ? [facility] [place]: EF 35% with mild to moderate DD. -->  ? TRANSTHORACIC ECHOCARDIOGRAM    ? [facility] [place]: EF 55%, G1 DD.  LVIDd 4.3; July 12, 2015: EF 55 to 60%.  Normal valves  ? TUBAL LIGATION    ? UPPER GASTROINTESTINAL ENDOSCOPY  07/06/2019  ? ? ?Current Outpatient Medications  ?Medication Sig Dispense Refill  ? acyclovir (ZOVIRAX) 400 MG tablet Take 1 tablet (400 mg total) by mouth 5 (five) times daily. 60 tablet 1  ? amLODipine (NORVASC) 10 MG tablet Take 1 tablet (10 mg total) by mouth daily. 90 tablet 1  ? Blood Glucose Monitoring Suppl (TRUE METRIX METER) w/Device KIT Use as directed 1 kit 0  ? carvedilol (COREG) 25 MG tablet Take 1 tablet (25 mg total) by mouth 2 (two) times daily with a meal. 180 tablet 3  ? Dulaglutide (TRULICITY) 1.5 NL/9.7QB SOPN Inject 1.5 mg into the skin once a week. 2 mL 2  ? furosemide (LASIX) 20 MG tablet Take 1 tablet (20 mg total) by mouth 2 (two) times daily as needed. 60 tablet 2  ? gabapentin (NEURONTIN) 300 MG capsule TAKE 2 CAPSULES (600 MG TOTAL) BY MOUTH 2 (TWO) TIMES DAILY. 180 capsule 1  ? glucose blood (TRUE METRIX BLOOD  GLUCOSE TEST) test strip Use as instructed 100 each 12  ? hyoscyamine (LEVSIN) 0.125 MG tablet Take 1 tablet (0.125 mg total) by mouth every 4 (four) hours as needed for cramping. 60 tablet 2  ? insulin glargine (LANTUS) 100 unit/mL SOPN Inject 30 Units into the skin at bedtime. 6 mL 2  ? metroNIDAZOLE (FLAGYL) 500 MG tablet Take 1 tablet (500 mg total) by mouth 2 (two) times daily. 20 tablet 0  ? spironolactone (ALDACTONE) 25 MG tablet Take 1 tablet (25 mg total) by mouth daily. 30 tablet 2  ? sucralfate (CARAFATE) 1 GM/10ML suspension Take 10 mLs (1 g total) by mouth 4 (four) times daily -  with meals and at bedtime. (Patient not taking: Reported on  06/03/2021) 420 mL 1  ? SYNTHROID 150 MCG tablet Take 1 tablet (150 mcg total) by mouth daily. 90 tablet 3  ? TRUEplus Lancets 28G MISC Use as directed 100 each 4  ? valsartan (DIOVAN) 320 MG tablet Take 1 tablet (320 mg total) by mouth daily. 90 tablet 3  ? ?No current facility-administered medications for this visit.  ? ? ?Allergies as of 07/29/2021 - Review Complete 06/03/2021  ?Allergen Reaction Noted  ? Chlorhexidine  03/04/2021  ? Lisinopril Cough 03/08/2019  ? Oxycodone Itching 12/04/2017  ? ? ?Family History  ?Problem Relation Age of Onset  ? Congestive Heart Failure Mother   ? Breast cancer Neg Hx   ? Colon cancer Neg Hx   ? Esophageal cancer Neg Hx   ? Rectal cancer Neg Hx   ? Stomach cancer Neg Hx   ? Inflammatory bowel disease Neg Hx   ? Liver disease Neg Hx   ? Pancreatic cancer Neg Hx   ? ? ?Social History  ? ?Socioeconomic History  ? Marital status: Single  ?  Spouse name: Not on file  ? Number of children: 2  ? Years of education: Not on file  ? Highest education level: Not on file  ?Occupational History  ? Not on file  ?Tobacco Use  ? Smoking status: Former  ?  Types: Cigars  ?  Quit date: 03/2019  ?  Years since quitting: 2.3  ? Smokeless tobacco: Never  ? Tobacco comments:  ?  smokes occasional cigar  ?Vaping Use  ? Vaping Use: Every day  ? Devices:  hemp  ?Substance and Sexual Activity  ? Alcohol use: Yes  ?  Comment: social  ? Drug use: Yes  ?  Types: Marijuana  ? Sexual activity: Not Currently  ?Other Topics Concern  ? Not on file  ?Social History

## 2021-07-29 NOTE — Progress Notes (Signed)
Attending Physician's Attestation  ? ?I have reviewed the chart.  ? ?I agree with the Advanced Practitioner's note, impression, and recommendations with any updates as below. ?Happy to hear some symptoms better with antibiotic therapy.  If patient has symptoms recur sooner than 3 to 4 months, then recommend true SIBO breath testing rather than continued empiric treatment.   ? ? ?Justice Britain, MD ?[facility] ?Advanced Endoscopy ?Office # [phone] ? ?

## 2021-07-29 NOTE — Patient Instructions (Signed)
If you are age 57 or older, your body mass index should be between 23-30. Your Body mass index is 30.36 kg/m?Marland Kitchen If this is out of the aforementioned range listed, please consider follow up with your Primary Care Provider. ? ?If you are age 41 or younger, your body mass index should be between 19-25. Your Body mass index is 30.36 kg/m?Marland Kitchen If this is out of the aformentioned range listed, please consider follow up with your Primary Care Provider.  ? ?________________________________________________________ ? ?The [facility] providers would like to encourage you to use [facility] to communicate with providers for non-urgent requests or questions.  Due to long hold times on the telephone, sending your provider a message by [facility] may be a faster and more efficient way to get a response.  Please allow 48 business hours for a response.  Please remember that this is for non-urgent requests.  ?_______________________________________________________ ? ?You have been referred to [facility] - they will call you to schedule an appointment. ? ?Please follow up with Dr. Rush Landmark in three months ?

## 2021-07-30 ENCOUNTER — Other Ambulatory Visit: Payer: Self-pay

## 2021-07-30 MED ORDER — TRULICITY 1.5 MG/0.5ML ~~LOC~~ SOAJ
1.5000 mg | SUBCUTANEOUS | 0 refills | Status: DC
  Filled 2021-07-30: qty 2, 28d supply, fill #0

## 2021-08-01 ENCOUNTER — Other Ambulatory Visit: Payer: Self-pay

## 2021-09-04 ENCOUNTER — Other Ambulatory Visit: Payer: Self-pay

## 2021-09-04 ENCOUNTER — Other Ambulatory Visit: Payer: Self-pay | Admitting: Critical Care Medicine

## 2021-09-05 ENCOUNTER — Other Ambulatory Visit: Payer: Self-pay

## 2021-09-09 NOTE — Progress Notes (Signed)
?Suzanne Cline D.O. ?[facility] ?[place] ?Phone: [phone] ?Subjective:   ?I, Suzanne Cline, am serving as a scribe for Dr. Hulan Saas. ? ?This visit occurred during the SARS-CoV-2 public health emergency.  Safety protocols were in place, including screening questions prior to the visit, additional usage of staff PPE, and extensive cleaning of exam room while observing appropriate contact time as indicated for disinfecting solutions.  ? ? ?I'm seeing this patient by the request  of:  Suzanne Cline ? ?CC: Upper stomach pain, chest discomfort ? ?[id number]  ?Suzanne Cline is a 56 y.o. female coming in with complaint of back pain that is related to gastro issues and began in 2022. Developed swelling and hard pockets initially. Tried antibiotics which helped initially. Patient notes pressure and feels like something is sitting in center of UQ beneath her breasts. States that she can feel peristalsis in LLQ. No problem having bowel movements due to diet changes, taking ginger tea and doing intermittent fasting. Patient has corresponding back pain in thoracic spine when she feels pressure in stomach is at it's worst. Patient was taking omeprazole but discontinued.  He has not noticed any significant difference since then. ? ?Interval care by gastroenterology ?  10/13/2019 EGD with no gross lesions in the esophagus, 2 erosions in the stomach and no gross lesions in the duodenum.  Patient was given a one-time PPI switch trecommended Carafate  It was discussed that patient's etiology of bandlike discomfort seem less likely to be GI in origin rather musculoskeletal.  Recommend considering SIBO breath test in the future for bloating.  Pathology showed mucosa with vascular congestion and patient was told to stay on Pepcid 40 mg daily and add Gaviscon for breakthrough symptoms. ?   04/01/2021 patient had follow-up visit with Dr. Joya Gaskins with pulmonary.   hysterectomy in  November but she continued with heartburn symptoms and fullness in the upper abdomen.  She was continued on Levsin. ?   06/03/2021 patient seen in clinic and described that she continues with bloating and swelling of her abdomen as well as some pain towards the left upper quadrant and epigastrium.  Describes using heating pads and a waist trainer to help.  Previously Dr. Rush Landmark had thought possibly musculoskeletal nature of epigastric pain versus SIBO or functional.  At that time ordered SIBO breath testing.  Also discussed possibility of IBS.  Also recommended increasing Hyoscyamine up to 4 times a day. ?   06/22/2021 patient discussed that she could not complete the fasting for SIBO testing.  She was empirically treated with Flagyl 500 twice daily for 10 days.  Also discussed possible CT in the future given her uterine resection in the past. ?   07/16/2021 patient requested a refill of Flagyl.  This was refilled 1 additional time for her and it was recommended she come in for an appointment.  Patient did respond to the last dose of the Flagyl. ? ?  ? ?Past Medical History:  ?Diagnosis Date  ? CHF (congestive heart failure) ([place])   ? Diabetes mellitus without complication ([place])   ? Difficult intubation   ? Family history of adverse reaction to anesthesia   ? Fibroids, intramural   ? s/p uterine artery embolization  ? Former cigarette smoker 04/2011  ? History of Resolved Nonischemic Congestive Cardiomyopathy ([place]) 04/2011  ? Echo March 2013: EF 35% with mild/moderate diastolic dysfunction -> Follow-Up Echo March 2014 EF 55% with G1 DD.  ? Hypertension   ?  Hypertensive heart disease   ? Difficult control hypertension  ? Hypothyroidism   ? Mild concentric left ventricular hypertrophy (LVH) 10/08/2019  ? Moderate mitral valve regurgitation 10/08/2019  ? Echo done June 2021; Dr. Ellyn Hack  ? Neuromuscular disorder ([place])   ? diabetic neuropathy  ? OSA (obstructive sleep apnea)   ? Sleep apnea   ? doesn't use cpap  regularly  ? Thyroid disease   ? post thyroidectomy  ? ?Past Surgical History:  ?Procedure Laterality Date  ? ABLATION    ? uterus  ? COLONOSCOPY    ? 2017 -  normal: in [place], [place]   ? DIAGNOSTIC LAPAROSCOPY    ? for fibroids  ? DILATION AND CURETTAGE OF UTERUS    ? HYSTERECTOMY ABDOMINAL WITH SALPINGECTOMY Bilateral 03/04/2021  ? Procedure: HYSTERECTOMY ABDOMINAL WITH BILATERAL SALPINGECTOMY;  Surgeon: [name], MD;  Location: [place];  Service: Gynecology;  Laterality: Bilateral;  TAP BLOCK  ? LEFT HEART CATH AND CORONARY ANGIOGRAPHY  05/19/2011  ? ([facility]): Normal coronary arteries  ? THYROIDECTOMY    ? TRANSTHORACIC ECHOCARDIOGRAM  07/18/2011  ? [facility] [place]: EF 35% with mild to moderate DD. -->  ? TRANSTHORACIC ECHOCARDIOGRAM    ? [facility] [place]: EF 55%, G1 DD.  LVIDd 4.3; July 12, 2015: EF 55 to 60%.  Normal valves  ? TUBAL LIGATION    ? UPPER GASTROINTESTINAL ENDOSCOPY  07/06/2019  ? ?Social History  ? ?Socioeconomic History  ? Marital status: Single  ?  Spouse name: Not on file  ? Number of children: 2  ? Years of education: Not on file  ? Highest education level: Not on file  ?Occupational History  ? Not on file  ?Tobacco Use  ? Smoking status: Former  ?  Types: Cigars  ?  Quit date: 03/2019  ?  Years since quitting: 2.4  ? Smokeless tobacco: Never  ? Tobacco comments:  ?  smokes occasional cigar  ?Vaping Use  ? Vaping Use: Every day  ? Devices: hemp  ?Substance and Sexual Activity  ? Alcohol use: Yes  ?  Comment: social  ? Drug use: Yes  ?  Types: Marijuana  ? Sexual activity: Not Currently  ?Other Topics Concern  ? Not on file  ?Social History Narrative  ? Not on file  ? ?Social Determinants of Health  ? ?Financial Resource Strain: Not on file  ?Food Insecurity: No Food Insecurity  ? Worried About Charity fundraiser in the Last Year: Never true  ? Ran Out of Food in the Last Year: Never true  ?Transportation Needs: No Transportation Needs  ? Lack of  Transportation (Medical): No  ? Lack of Transportation (Non-Medical): No  ?Physical Activity: Not on file  ?Stress: Not on file  ?Social Connections: Not on file  ? ?Allergies  ?Allergen Reactions  ? Chlorhexidine   ?  Itching,redness  ? Lisinopril Cough  ? Oxycodone Itching  ? ?Family History  ?Problem Relation Age of Onset  ? Congestive Heart Failure Mother   ? Breast cancer Neg Hx   ? Colon cancer Neg Hx   ? Esophageal cancer Neg Hx   ? Rectal cancer Neg Hx   ? Stomach cancer Neg Hx   ? Inflammatory bowel disease Neg Hx   ? Liver disease Neg Hx   ? Pancreatic cancer Neg Hx   ? ? ?Current Outpatient Medications (Endocrine & Metabolic):  ?  Dulaglutide (TRULICITY) 1.5 GU/5.4YH SOPN, Inject 1.5 mg into the skin  once a week. ?  insulin glargine (LANTUS) 100 unit/mL SOPN, Inject 30 Units into the skin at bedtime. ?  SYNTHROID 150 MCG tablet, Take 1 tablet (150 mcg total) by mouth daily. ? ?Current Outpatient Medications (Cardiovascular):  ?  amLODipine (NORVASC) 10 MG tablet, Take 1 tablet (10 mg total) by mouth daily. ?  carvedilol (COREG) 25 MG tablet, Take 1 tablet (25 mg total) by mouth 2 (two) times daily with a meal. ?  furosemide (LASIX) 20 MG tablet, Take 1 tablet (20 mg total) by mouth 2 (two) times daily as needed. ?  spironolactone (ALDACTONE) 25 MG tablet, Take 1 tablet (25 mg total) by mouth daily. ?  valsartan (DIOVAN) 320 MG tablet, Take 1 tablet (320 mg total) by mouth daily. ? ? ? ? ?Current Outpatient Medications (Other):  ?  acyclovir (ZOVIRAX) 400 MG tablet, Take 1 tablet (400 mg total) by mouth 5 (five) times daily. ?  glucose blood (TRUE METRIX BLOOD GLUCOSE TEST) test strip, Use as instructed ?  hyoscyamine (LEVSIN) 0.125 MG tablet, Take 1 tablet (0.125 mg total) by mouth every 4 (four) hours as needed for cramping. ?  metroNIDAZOLE (FLAGYL) 500 MG tablet, Take 1 tablet (500 mg total) by mouth 2 (two) times daily. ?  TRUEplus Lancets 28G MISC, Use as directed ? ? ?Reviewed prior external  information including notes and imaging from  ?primary care provider this includes patient's primary, gastroenterology and OB/GYN ?As well as notes that were available from care everywhere and other healthcare systems.

## 2021-09-10 ENCOUNTER — Ambulatory Visit (INDEPENDENT_AMBULATORY_CARE_PROVIDER_SITE_OTHER): Payer: Self-pay

## 2021-09-10 ENCOUNTER — Encounter: Payer: Self-pay | Admitting: Family Medicine

## 2021-09-10 ENCOUNTER — Ambulatory Visit (INDEPENDENT_AMBULATORY_CARE_PROVIDER_SITE_OTHER): Payer: Self-pay | Admitting: Family Medicine

## 2021-09-10 VITALS — BP 114/82 | HR 68 | Ht 63.0 in | Wt 178.0 lb

## 2021-09-10 DIAGNOSIS — R1013 Epigastric pain: Secondary | ICD-10-CM

## 2021-09-10 DIAGNOSIS — R911 Solitary pulmonary nodule: Secondary | ICD-10-CM

## 2021-09-10 DIAGNOSIS — G8929 Other chronic pain: Secondary | ICD-10-CM | POA: Insufficient documentation

## 2021-09-10 DIAGNOSIS — R222 Localized swelling, mass and lump, trunk: Secondary | ICD-10-CM

## 2021-09-10 DIAGNOSIS — M2550 Pain in unspecified joint: Secondary | ICD-10-CM

## 2021-09-10 DIAGNOSIS — R079 Chest pain, unspecified: Secondary | ICD-10-CM

## 2021-09-10 LAB — COMPREHENSIVE METABOLIC PANEL
ALT: 15 U/L (ref 0–35)
AST: 16 U/L (ref 0–37)
Albumin: 4.3 g/dL (ref 3.5–5.2)
Alkaline Phosphatase: 72 U/L (ref 39–117)
BUN: 11 mg/dL (ref 6–23)
CO2: 29 mEq/L (ref 19–32)
Calcium: 9.4 mg/dL (ref 8.4–10.5)
Chloride: 104 mEq/L (ref 96–112)
Creatinine, Ser: 1.15 mg/dL (ref 0.40–1.20)
GFR: 53.24 mL/min — ABNORMAL LOW (ref >60.00)
Glucose, Bld: 194 mg/dL — ABNORMAL HIGH (ref 70–99)
Potassium: 4.2 mEq/L (ref 3.5–5.1)
Sodium: 140 mEq/L (ref 135–145)
Total Bilirubin: 0.3 mg/dL (ref 0.2–1.2)
Total Protein: 7.4 g/dL (ref 6.0–8.3)

## 2021-09-10 LAB — CBC WITH DIFFERENTIAL/PLATELET
Basophils Absolute: 0 10*3/uL (ref 0.0–0.1)
Basophils Relative: 0.4 % (ref 0.0–3.0)
Eosinophils Absolute: 0.3 10*3/uL (ref 0.0–0.7)
Eosinophils Relative: 4.2 % (ref 0.0–5.0)
HCT: 36.9 % (ref 36.0–46.0)
Hemoglobin: 12.1 g/dL (ref 12.0–15.0)
Lymphocytes Relative: 33.9 % (ref 12.0–46.0)
Lymphs Abs: 2.3 10*3/uL (ref 0.7–4.0)
MCHC: 32.7 g/dL (ref 30.0–36.0)
MCV: 84.2 fl (ref 78.0–100.0)
Monocytes Absolute: 0.4 10*3/uL (ref 0.1–1.0)
Monocytes Relative: 5.8 % (ref 3.0–12.0)
Neutro Abs: 3.7 10*3/uL (ref 1.4–7.7)
Neutrophils Relative %: 55.7 % (ref 43.0–77.0)
Platelets: 221 10*3/uL (ref 150.0–400.0)
RBC: 4.38 Mil/uL (ref 3.87–5.11)
RDW: 14.8 % (ref 11.5–15.5)
WBC: 6.7 10*3/uL (ref 4.0–10.5)

## 2021-09-10 LAB — URIC ACID: Uric Acid, Serum: 6.3 mg/dL (ref 2.4–7.0)

## 2021-09-10 LAB — C-REACTIVE PROTEIN: CRP: <1 mg/dL (ref 0.5–20.0)

## 2021-09-10 LAB — IBC PANEL
Iron: 65 ug/dL (ref 42–145)
Saturation Ratios: 17.1 % — ABNORMAL LOW (ref 20.0–50.0)
TIBC: 380.8 ug/dL (ref 250.0–450.0)
Transferrin: 272 mg/dL (ref 212.0–360.0)

## 2021-09-10 LAB — SEDIMENTATION RATE: Sed Rate: 53 mm/hr — ABNORMAL HIGH (ref 0–30)

## 2021-09-10 LAB — TSH: TSH: 1.71 u[IU]/mL (ref 0.35–5.50)

## 2021-09-10 LAB — FERRITIN: Ferritin: 159.7 ng/mL (ref 10.0–291.0)

## 2021-09-10 LAB — VITAMIN D 25 HYDROXY (VIT D DEFICIENCY, FRACTURES): VITD: 26.18 ng/mL — ABNORMAL LOW (ref 30.00–100.00)

## 2021-09-10 NOTE — Patient Instructions (Addendum)
Wonderful to meet you  ?Labs today  ?Will get chest xray today but also need CT scan of chest that was ordered (non-contrast chest) ?Probiotic daily  ?Vitamin D 2000 IU daily  ?See me again in 4-6 weeks but you will here from me through my chart with all the results  ? ?

## 2021-09-10 NOTE — Assessment & Plan Note (Signed)
Patient has had chronic pain for quite some time.  Has had a very thorough work-up.  Patient has had esophageal eosinophilia but seems to respond to the treatment for that and continues to have the discomfort.  Patient was found to have a pulmonary nodule that has not quite been followed up and do feel a CT chest with it being greater than 2 years would be beneficial with it being somewhat in the similar proximity.  Likely this is not a.  Patient has had difficulty with her blood sugars here as well and we will continue to monitor but CT scan did not show any significant abnormality noted of the pancreas.  Discussed with patient I would like a further laboratory work-up to see if anything else is contributing.  We discussed some over-the-counter things such as a probiotic and vitamin D supplementation at the moment.  Given some home exercises to give some guidance with some mild muscle imbalance.  Did not start osteopathic manipulation.  We will consider that at follow-up in the next 4 weeks otherwise. ?

## 2021-09-10 NOTE — Assessment & Plan Note (Signed)
Seen in 2021 and will get a repeat [facility] imaging. ?

## 2021-09-11 ENCOUNTER — Other Ambulatory Visit: Payer: Self-pay

## 2021-09-11 LAB — ANA: Anti Nuclear Antibody (ANA): NEGATIVE

## 2021-09-11 LAB — RHEUMATOID FACTOR: Rheumatoid fact SerPl-aCnc: <14 IU/mL (ref <14)

## 2021-09-11 LAB — PTH, INTACT AND CALCIUM
Calcium: 9.5 mg/dL (ref 8.6–10.4)
PTH: 60 pg/mL (ref 16–77)

## 2021-09-11 LAB — ANGIOTENSIN CONVERTING ENZYME: Angiotensin-Converting Enzyme: 25 U/L (ref 9–67)

## 2021-09-11 LAB — CYCLIC CITRUL PEPTIDE ANTIBODY, IGG: Cyclic Citrullin Peptide Ab: <16 UNITS

## 2021-09-11 LAB — CALCIUM, IONIZED: Calcium, Ion: 5 mg/dL (ref 4.7–5.5)

## 2021-09-12 ENCOUNTER — Other Ambulatory Visit: Payer: Self-pay

## 2021-09-15 ENCOUNTER — Encounter: Payer: Self-pay | Admitting: Family Medicine

## 2021-09-15 NOTE — Telephone Encounter (Signed)
Spoke with patient per results. Per a verbal from Dr. Tamala Julian he will be in contact once he has more labwork back to explain elevated ESR.

## 2021-10-08 NOTE — Progress Notes (Signed)
Zach Jurell [facility] [address] [place] Phone: [phone] Subjective:   IVilma Meckel, am serving as a scribe for Dr. Hulan Saas.  I'm seeing this patient by the request  of:  [name], MD  CC:   [id number]  09/10/2021 Patient has had chronic pain for quite some time.  Has had a very thorough work-up.  Patient has had esophageal eosinophilia but seems to respond to the treatment for that and continues to have the discomfort.  Patient was found to have a pulmonary nodule that has not quite been followed up and do feel a CT chest with it being greater than 2 years would be beneficial with it being somewhat in the similar proximity.  Likely this is not a.  Patient has had difficulty with her blood sugars here as well and we will continue to monitor but CT scan did not show any significant abnormality noted of the pancreas.  Discussed with patient I would like a further laboratory work-up to see if anything else is contributing.  We discussed some over-the-counter things such as a probiotic and vitamin D supplementation at the moment.  Given some home exercises to give some guidance with some mild muscle imbalance.  Did not start osteopathic manipulation.  We will consider that at follow-up in the next 4 weeks otherwise.  Updated 10/09/2021 Karmen Stabs Merlene Dante is a 56 y.o. female coming in with complaint of abdominal pain. Talk about labs. Increased Vit D dosage that was recommended. Same as last time. Was to get CT of chest to further evaluate previous pulmonary nodule, did not do it yet. Continues to have pain, maybe less severity when pain occurs but still same frequency       Past Medical History:  Diagnosis Date   CHF (congestive heart failure) ([place])    Diabetes mellitus without complication ([place])    Difficult intubation    Family history of adverse reaction to anesthesia    Fibroids, intramural    s/p uterine artery  embolization   Former cigarette smoker 04/2011   History of Resolved Nonischemic Congestive Cardiomyopathy ([place]) 04/2011   Echo March 2013: EF 35% with mild/moderate diastolic dysfunction -> Follow-Up Echo March 2014 EF 55% with G1 DD.   Hypertension    Hypertensive heart disease    Difficult control hypertension   Hypothyroidism    Mild concentric left ventricular hypertrophy (LVH) 10/08/2019   Moderate mitral valve regurgitation 10/08/2019   Echo done June 2021; Dr. Ellyn Hack   Neuromuscular disorder [facility])    diabetic neuropathy   OSA (obstructive sleep apnea)    Sleep apnea    doesn't use cpap regularly   Thyroid disease    post thyroidectomy   Past Surgical History:  Procedure Laterality Date   ABLATION     uterus   COLONOSCOPY     2017 -  normal: in [place], [place]     for fibroids   DILATION AND CURETTAGE OF UTERUS     HYSTERECTOMY ABDOMINAL WITH SALPINGECTOMY Bilateral 03/04/2021   Procedure: HYSTERECTOMY ABDOMINAL WITH BILATERAL SALPINGECTOMY;  Surgeon: [name], MD;  Location: [place];  Service: Gynecology;  Laterality: Bilateral;  TAP BLOCK   LEFT HEART CATH AND CORONARY ANGIOGRAPHY  05/19/2011   ([facility]): Normal coronary arteries   THYROIDECTOMY     TRANSTHORACIC ECHOCARDIOGRAM  07/18/2011   [facility] [place]: EF 35% with mild to moderate DD. -->   TRANSTHORACIC ECHOCARDIOGRAM  [facility] [place]: EF 55%, G1 DD.  LVIDd 4.3; July 12, 2015: EF 55 to 60%.  Normal valves   TUBAL LIGATION     UPPER GASTROINTESTINAL ENDOSCOPY  07/06/2019   Social History   Socioeconomic History   Marital status: Single    Spouse name: Not on file   Number of children: 2   Years of education: Not on file   Highest education level: Not on file  Occupational History   Not on file  Tobacco Use   Smoking status: Former    Types: Cigars    Quit date: 03/2019    Years since quitting: 2.5   Smokeless tobacco: Never    Tobacco comments:    smokes occasional cigar  Vaping Use   Vaping Use: Every day   Devices: hemp  Substance and Sexual Activity   Alcohol use: Yes    Comment: social   Drug use: Yes    Types: Marijuana   Sexual activity: Not Currently  Other Topics Concern   Not on file  Social History Narrative   Not on file   Social Determinants of Health   Financial Resource Strain: Not on file  Food Insecurity: No Food Insecurity (03/31/2021)   Hunger Vital Sign    Worried About Running Out of Food in the Last Year: Never true    Ran Out of Food in the Last Year: Never true  Transportation Needs: No Transportation Needs (03/31/2021)   PRAPARE - Hydrologist (Medical): No    Lack of Transportation (Non-Medical): No  Physical Activity: Not on file  Stress: Not on file  Social Connections: Not on file   Allergies  Allergen Reactions   Chlorhexidine     Itching,redness   Lisinopril Cough   Oxycodone Itching   Family History  Problem Relation Age of Onset   Congestive Heart Failure Mother    Breast cancer Neg Hx    Colon cancer Neg Hx    Esophageal cancer Neg Hx    Rectal cancer Neg Hx    Stomach cancer Neg Hx    Inflammatory bowel disease Neg Hx    Liver disease Neg Hx    Pancreatic cancer Neg Hx     Current Outpatient Medications (Endocrine & Metabolic):    Dulaglutide (TRULICITY) 1.5 JK/0.9FG SOPN, Inject 1.5 mg into the skin once a week.   insulin glargine (LANTUS) 100 unit/mL SOPN, Inject 30 Units into the skin at bedtime.   SYNTHROID 150 MCG tablet, Take 1 tablet (150 mcg total) by mouth daily.  Current Outpatient Medications (Cardiovascular):    amLODipine (NORVASC) 10 MG tablet, Take 1 tablet (10 mg total) by mouth daily.   carvedilol (COREG) 25 MG tablet, Take 1 tablet (25 mg total) by mouth 2 (two) times daily with a meal.   furosemide (LASIX) 20 MG tablet, Take 1 tablet (20 mg total) by mouth 2 (two) times daily as needed.    spironolactone (ALDACTONE) 25 MG tablet, Take 1 tablet (25 mg total) by mouth daily.   valsartan (DIOVAN) 320 MG tablet, Take 1 tablet (320 mg total) by mouth daily.     Current Outpatient Medications (Other):    Vitamin D, Ergocalciferol, (DRISDOL) 1.25 MG (50000 UNIT) CAPS capsule, Take 1 capsule (50,000 Units total) by mouth every 7 (seven) days.   acyclovir (ZOVIRAX) 400 MG tablet, Take 1 tablet (400 mg total) by mouth 5 (five) times daily.   glucose blood (TRUE METRIX BLOOD GLUCOSE TEST) test  strip, Use as instructed   hyoscyamine (LEVSIN) 0.125 MG tablet, Take 1 tablet (0.125 mg total) by mouth every 4 (four) hours as needed for cramping.   metroNIDAZOLE (FLAGYL) 500 MG tablet, Take 1 tablet (500 mg total) by mouth 2 (two) times daily.   TRUEplus Lancets 28G MISC, Use as directed   Reviewed prior external information including notes and imaging from  primary care provider As well as notes that were available from care everywhere and other healthcare systems.  Past medical history, social, surgical and family history all reviewed in electronic medical record.  No pertanent information unless stated regarding to the chief complaint.   Review of Systems:  No headache, visual changes, nausea, vomiting, diarrhea, constipation, dizziness, abdominal pain, skin rash, fevers, chills, night sweats, weight loss, swollen lymph nodes, chest pain, shortness of breath, mood changes. POSITIVE muscle aches, abd pain, body aches   Objective  Blood pressure 122/90, pulse 81, height '5\' 3"'$  (1.6 m), weight 171 lb (77.6 kg), SpO2 95 %.   General: No apparent distress alert and oriented x3 mood and affect normal, dressed appropriately.  HEENT: Pupils equal, extraocular movements intact  Respiratory: Patient's speak in full sentences and does not appear short of breath  Cardiovascular: No lower extremity edema, non tender, no erythema  Non specific MSK pain in the back, and multiple soft tissue areas out  of proportion to amount of pressure and palpation, no masses felt in abd but mild distention, patient though does have voluntary guarding     Impression and Recommendations:    The above documentation has been reviewed and is accurate and complete Lyndal Pulley, DO

## 2021-10-09 ENCOUNTER — Ambulatory Visit (INDEPENDENT_AMBULATORY_CARE_PROVIDER_SITE_OTHER): Payer: Self-pay | Admitting: Family Medicine

## 2021-10-09 ENCOUNTER — Other Ambulatory Visit: Payer: Self-pay

## 2021-10-09 VITALS — BP 122/90 | HR 81 | Ht 63.0 in | Wt 171.0 lb

## 2021-10-09 DIAGNOSIS — Z90710 Acquired absence of both cervix and uterus: Secondary | ICD-10-CM

## 2021-10-09 DIAGNOSIS — Z87891 Personal history of nicotine dependence: Secondary | ICD-10-CM

## 2021-10-09 DIAGNOSIS — M2550 Pain in unspecified joint: Secondary | ICD-10-CM

## 2021-10-09 DIAGNOSIS — R911 Solitary pulmonary nodule: Secondary | ICD-10-CM

## 2021-10-09 DIAGNOSIS — R1013 Epigastric pain: Secondary | ICD-10-CM

## 2021-10-09 DIAGNOSIS — G8929 Other chronic pain: Secondary | ICD-10-CM

## 2021-10-09 LAB — COMPREHENSIVE METABOLIC PANEL
ALT: 14 U/L (ref 0–35)
AST: 16 U/L (ref 0–37)
Albumin: 4.5 g/dL (ref 3.5–5.2)
Alkaline Phosphatase: 87 U/L (ref 39–117)
BUN: 19 mg/dL (ref 6–23)
CO2: 29 mEq/L (ref 19–32)
Calcium: 10.1 mg/dL (ref 8.4–10.5)
Chloride: 98 mEq/L (ref 96–112)
Creatinine, Ser: 1.26 mg/dL — ABNORMAL HIGH (ref 0.40–1.20)
GFR: 47.68 mL/min — ABNORMAL LOW (ref >60.00)
Glucose, Bld: 307 mg/dL — ABNORMAL HIGH (ref 70–99)
Potassium: 4.2 mEq/L (ref 3.5–5.1)
Sodium: 135 mEq/L (ref 135–145)
Total Bilirubin: 0.4 mg/dL (ref 0.2–1.2)
Total Protein: 8.1 g/dL (ref 6.0–8.3)

## 2021-10-09 LAB — LUTEINIZING HORMONE: LH: 38.79 m[IU]/mL

## 2021-10-09 LAB — SEDIMENTATION RATE: Sed Rate: 73 mm/hr — ABNORMAL HIGH (ref 0–30)

## 2021-10-09 LAB — FOLLICLE STIMULATING HORMONE: FSH: 79.8 m[IU]/mL

## 2021-10-09 LAB — VITAMIN B12: Vitamin B-12: 787 pg/mL (ref 211–911)

## 2021-10-09 MED ORDER — VITAMIN D (ERGOCALCIFEROL) 1.25 MG (50000 UNIT) PO CAPS
50000.0000 [IU] | ORAL_CAPSULE | ORAL | 0 refills | Status: DC
  Filled 2021-10-09 – 2021-11-05 (×2): qty 12, 84d supply, fill #0

## 2021-10-09 NOTE — Patient Instructions (Addendum)
[facility] [phone] Call Today  When we receive your results we will contact you.  Labs today See you again in 7-8 weeks, but we'll be talking before then

## 2021-10-10 ENCOUNTER — Other Ambulatory Visit: Payer: Self-pay

## 2021-10-10 DIAGNOSIS — N289 Disorder of kidney and ureter, unspecified: Secondary | ICD-10-CM

## 2021-10-10 LAB — CA 125: CA 125: 4 U/mL (ref <35)

## 2021-10-10 NOTE — Assessment & Plan Note (Signed)
Scheduled to have CT scan, also will look and diaphragm to see if playing a role.  RTC after imaging.

## 2021-10-10 NOTE — Assessment & Plan Note (Signed)
Does not appear back, ?hormone, ?poly arthralgia Increase ESR Recheck labs  RTC after imaging

## 2021-10-10 NOTE — Assessment & Plan Note (Signed)
Recheck LH/FSH to see if possible hormonal affect

## 2021-10-14 ENCOUNTER — Other Ambulatory Visit: Payer: Self-pay

## 2021-10-16 ENCOUNTER — Other Ambulatory Visit: Payer: Self-pay

## 2021-11-05 ENCOUNTER — Other Ambulatory Visit: Payer: Self-pay

## 2021-11-06 ENCOUNTER — Other Ambulatory Visit: Payer: Self-pay

## 2021-11-06 ENCOUNTER — Ambulatory Visit
Admission: RE | Admit: 2021-11-06 | Discharge: 2021-11-06 | Disposition: A | Discharge status: Home / Self Care | Payer: Self-pay | Source: Ambulatory Visit | Attending: Family Medicine | Admitting: Family Medicine

## 2021-11-06 DIAGNOSIS — R222 Localized swelling, mass and lump, trunk: Secondary | ICD-10-CM

## 2021-11-06 DIAGNOSIS — R079 Chest pain, unspecified: Secondary | ICD-10-CM

## 2021-11-10 ENCOUNTER — Other Ambulatory Visit: Payer: Self-pay

## 2021-11-10 ENCOUNTER — Encounter: Payer: Self-pay | Admitting: Family Medicine

## 2021-11-10 ENCOUNTER — Other Ambulatory Visit: Payer: Self-pay | Admitting: Physician Assistant

## 2021-11-10 DIAGNOSIS — R079 Chest pain, unspecified: Secondary | ICD-10-CM

## 2021-11-10 DIAGNOSIS — M542 Cervicalgia: Secondary | ICD-10-CM

## 2021-11-11 ENCOUNTER — Other Ambulatory Visit: Payer: Self-pay

## 2021-11-19 ENCOUNTER — Other Ambulatory Visit: Payer: Self-pay

## 2021-11-19 ENCOUNTER — Ambulatory Visit
Admission: RE | Admit: 2021-11-19 | Discharge: 2021-11-19 | Disposition: A | Discharge status: Home / Self Care | Payer: Self-pay | Source: Ambulatory Visit | Attending: Family Medicine | Admitting: Family Medicine

## 2021-11-19 DIAGNOSIS — E041 Nontoxic single thyroid nodule: Secondary | ICD-10-CM

## 2021-11-19 DIAGNOSIS — M542 Cervicalgia: Secondary | ICD-10-CM

## 2021-11-26 NOTE — Progress Notes (Unsigned)
Suzanne Cline [address] [place] Phone: [phone] Subjective:   IVilma Cline, am serving as a scribe for Dr. Hulan Saas.  I'm seeing this patient by the request  of:  [name], MD  CC: Polyarthralgia follow-up  [id number]  10/09/2021 Scheduled to have CT scan, also will look and diaphragm to see if playing a role.  RTC after imaging.   Recheck LH/FSH to see if possible hormonal affect   Does not appear back, ?hormone, ?poly arthralgia Increase ESR Recheck labs  RTC after imaging   Updated 11/27/2021 Suzanne Cline is a 56 y.o. female coming in with complaint of polyarthralgia. Doing well. Here to discuss [place]. Surgeon from [facility] called.  US IMPRESSION: Approximally 1.6 cm round solid soft tissue nodule in the inferior aspect of the left thyroid resection bed concerning for residual/recurrent thyroid tissue or nodule. Fine-needle aspiration biopsy should be considered.  IMPRESSION: 1. A 15 x 17 mm nodule to the left of the trachea at the thoracic inlet is nonspecific in the setting of prior thyroidectomy. Recommend initial assessment with ultrasound. 2. No other chest wall soft tissue abnormalities. 3. Subpleural right lower lobe pulmonary nodule measuring 6 x 6 mm, unchanged in size but increased in density from 2021 abdominal CT. Per Fleischner Society Guidelines, recommend a noncontrast chest CT at 6-12 months. If patient is high risk for malignancy, recommend an additional noncontrast chest CT at 18-24 months, if patient is low risk for malignancy a noncontrast chest CT at 18-24 months is optional. These guidelines do not apply to immunocompromised patients and patients with cancer. Follow up in patients with significant comorbidities as clinically warranted. For lung cancer screening, adhere to Lung-RADS guidelines. Reference: Radiology. 2017; 284(1):228-43.   No follow-up imaging is  recommended. Reference: JACR 2020 Feb;17(2):248-254 4. Trace pleural thickening or effusion in the mid left hemithorax.   Aortic Atherosclerosis (ICD10-I70.0).        Past Medical History:  Diagnosis Date   CHF (congestive heart failure) ([place])    Diabetes mellitus without complication ([place])    Difficult intubation    Family history of adverse reaction to anesthesia    Fibroids, intramural    s/p uterine artery embolization   Former cigarette smoker 04/2011   History of Resolved Nonischemic Congestive Cardiomyopathy ([place]) 04/2011   Echo March 2013: EF 35% with mild/moderate diastolic dysfunction -> Follow-Up Echo March 2014 EF 55% with G1 DD.   Hypertension    Hypertensive heart disease    Difficult control hypertension   Hypothyroidism    Mild concentric left ventricular hypertrophy (LVH) 10/08/2019   Moderate mitral valve regurgitation 10/08/2019   Echo done June 2021; Dr. Ellyn Hack   Neuromuscular disorder [facility])    diabetic neuropathy   OSA (obstructive sleep apnea)    Sleep apnea    doesn't use cpap regularly   Thyroid disease    post thyroidectomy   Past Surgical History:  Procedure Laterality Date   ABLATION     uterus   COLONOSCOPY     2017 -  normal: in [place], [place]     for fibroids   DILATION AND CURETTAGE OF UTERUS     HYSTERECTOMY ABDOMINAL WITH SALPINGECTOMY Bilateral 03/04/2021   Procedure: HYSTERECTOMY ABDOMINAL WITH BILATERAL SALPINGECTOMY;  Surgeon: [name], MD;  Location: [facility];  Service: Gynecology;  Laterality: Bilateral;  TAP BLOCK   LEFT HEART CATH AND CORONARY ANGIOGRAPHY  05/19/2011   (  [facility]): Normal coronary arteries   THYROIDECTOMY     TRANSTHORACIC ECHOCARDIOGRAM  07/18/2011   [facility] [place]: EF 35% with mild to moderate DD. -->   TRANSTHORACIC ECHOCARDIOGRAM     [facility] [place]: EF 55%, G1 DD.  LVIDd 4.3; July 12, 2015: EF 55 to 60%.  Normal valves   TUBAL  LIGATION     UPPER GASTROINTESTINAL ENDOSCOPY  07/06/2019   Social History   Socioeconomic History   Marital status: Single    Spouse name: Not on file   Number of children: 2   Years of education: Not on file   Highest education level: Not on file  Occupational History   Not on file  Tobacco Use   Smoking status: Former    Types: Cigars    Quit date: 03/2019    Years since quitting: 2.6   Smokeless tobacco: Never   Tobacco comments:    smokes occasional cigar  Vaping Use   Vaping Use: Every day   Devices: hemp  Substance and Sexual Activity   Alcohol use: Yes    Comment: social   Drug use: Yes    Types: Marijuana   Sexual activity: Not Currently  Other Topics Concern   Not on file  Social History Narrative   Not on file   Social Determinants of Health   Financial Resource Strain: Not on file  Food Insecurity: No Food Insecurity (03/31/2021)   Hunger Vital Sign    Worried About Running Out of Food in the Last Year: Never true    Ran Out of Food in the Last Year: Never true  Transportation Needs: No Transportation Needs (03/31/2021)   PRAPARE - Hydrologist (Medical): No    Lack of Transportation (Non-Medical): No  Physical Activity: Not on file  Stress: Not on file  Social Connections: Not on file   Allergies  Allergen Reactions   Chlorhexidine     Itching,redness   Lisinopril Cough   Oxycodone Itching   Family History  Problem Relation Age of Onset   Congestive Heart Failure Mother    Breast cancer Neg Hx    Colon cancer Neg Hx    Esophageal cancer Neg Hx    Rectal cancer Neg Hx    Stomach cancer Neg Hx    Inflammatory bowel disease Neg Hx    Liver disease Neg Hx    Pancreatic cancer Neg Hx     Current Outpatient Medications (Endocrine & Metabolic):    Dulaglutide (TRULICITY) 1.5 JO/8.3GP SOPN, Inject 1.5 mg into the skin once a week.   insulin glargine (LANTUS) 100 unit/mL SOPN, Inject 30 Units into the skin at  bedtime.   SYNTHROID 150 MCG tablet, Take 1 tablet (150 mcg total) by mouth daily.  Current Outpatient Medications (Cardiovascular):    amLODipine (NORVASC) 10 MG tablet, Take 1 tablet (10 mg total) by mouth daily.   carvedilol (COREG) 25 MG tablet, Take 1 tablet (25 mg total) by mouth 2 (two) times daily with a meal.   furosemide (LASIX) 20 MG tablet, Take 1 tablet (20 mg total) by mouth 2 (two) times daily as needed.   spironolactone (ALDACTONE) 25 MG tablet, Take 1 tablet (25 mg total) by mouth daily.   valsartan (DIOVAN) 320 MG tablet, Take 1 tablet (320 mg total) by mouth daily.     Current Outpatient Medications (Other):    acyclovir (ZOVIRAX) 400 MG tablet, Take 1 tablet (400 mg total) by mouth 5 (  five) times daily.   glucose blood (TRUE METRIX BLOOD GLUCOSE TEST) test strip, Use as instructed   hyoscyamine (LEVSIN) 0.125 MG tablet, Take 1 tablet (0.125 mg total) by mouth every 4 (four) hours as needed for cramping.   metroNIDAZOLE (FLAGYL) 500 MG tablet, Take 1 tablet (500 mg total) by mouth 2 (two) times daily.   TRUEplus Lancets 28G MISC, Use as directed   Vitamin D, Ergocalciferol, (DRISDOL) 1.25 MG (50000 UNIT) CAPS capsule, Take 1 capsule (50,000 Units total) by mouth every 7 (seven) days.   Reviewed prior external information including notes and imaging from  primary care provider As well as notes that were available from care everywhere and other healthcare systems.  Past medical history, social, surgical and family history all reviewed in electronic medical record.  No pertanent information unless stated regarding to the chief complaint.   Review of Systems:  No headache, visual changes, nausea, vomiting, diarrhea, constipation, dizziness, , skin rash, fevers, chills, night sweats, weight loss, swollen lymph nodes, joint swelling, chest pain, shortness of breath, mood changes. POSITIVE muscle aches, body aches, abdominal pain  Objective  Blood pressure 122/84, pulse  77, height 5' 3"  (1.6 m), weight 172 lb (78 kg), SpO2 94 %.   General: No apparent distress alert and oriented x3 mood and affect normal, dressed appropriately.  HEENT: Pupils equal, extraocular movements intact patient does have postsurgical changes from patient's thyroid and goiter previously.  Still has fullness noted of the left side of the neck.  Does not appear to be short of breath Respiratory: Patient's speak in full sentences and does not appear short of breath  Cardiovascular: No lower extremity edema, non tender, no erythema      Impression and Recommendations:     The above documentation has been reviewed and is accurate and complete Lyndal Pulley, DO

## 2021-11-27 ENCOUNTER — Ambulatory Visit (INDEPENDENT_AMBULATORY_CARE_PROVIDER_SITE_OTHER): Payer: Self-pay | Admitting: Family Medicine

## 2021-11-27 VITALS — BP 122/84 | HR 77 | Ht 63.0 in | Wt 172.0 lb

## 2021-11-27 DIAGNOSIS — E0789 Other specified disorders of thyroid: Secondary | ICD-10-CM | POA: Insufficient documentation

## 2021-11-27 DIAGNOSIS — R911 Solitary pulmonary nodule: Secondary | ICD-10-CM

## 2021-11-27 DIAGNOSIS — M2550 Pain in unspecified joint: Secondary | ICD-10-CM

## 2021-11-27 DIAGNOSIS — R700 Elevated erythrocyte sedimentation rate: Secondary | ICD-10-CM | POA: Insufficient documentation

## 2021-11-27 HISTORY — DX: Other specified disorders of thyroid: E07.89

## 2021-11-27 LAB — COMPREHENSIVE METABOLIC PANEL
ALT: 24 U/L (ref 0–35)
AST: 19 U/L (ref 0–37)
Albumin: 4.4 g/dL (ref 3.5–5.2)
Alkaline Phosphatase: 85 U/L (ref 39–117)
BUN: 12 mg/dL (ref 6–23)
CO2: 28 mEq/L (ref 19–32)
Calcium: 9.3 mg/dL (ref 8.4–10.5)
Chloride: 98 mEq/L (ref 96–112)
Creatinine, Ser: 1.04 mg/dL (ref 0.40–1.20)
GFR: 59.98 mL/min — ABNORMAL LOW (ref >60.00)
Glucose, Bld: 317 mg/dL — ABNORMAL HIGH (ref 70–99)
Potassium: 3.8 mEq/L (ref 3.5–5.1)
Sodium: 134 mEq/L — ABNORMAL LOW (ref 135–145)
Total Bilirubin: 0.4 mg/dL (ref 0.2–1.2)
Total Protein: 8 g/dL (ref 6.0–8.3)

## 2021-11-27 LAB — SEDIMENTATION RATE: Sed Rate: 50 mm/hr — ABNORMAL HIGH (ref 0–30)

## 2021-11-27 LAB — VITAMIN D 25 HYDROXY (VIT D DEFICIENCY, FRACTURES): VITD: 36.68 ng/mL (ref 30.00–100.00)

## 2021-11-27 LAB — LIPASE: Lipase: 47 U/L (ref 11.0–59.0)

## 2021-11-27 NOTE — Patient Instructions (Addendum)
Good to see you! Labs today Have an appointment just in case in a month

## 2021-11-27 NOTE — Assessment & Plan Note (Signed)
Small nodule noted in the left to the trachea at the thoracic inlet is nonspecific.  15 x 17 mm nodule noted.  On the cusp of the necessary for biopsy.  We will send to general surgery to further evaluate.  Had a significant increase in ESR and we will check as well.

## 2021-11-27 NOTE — Assessment & Plan Note (Signed)
Patient does have increase in density noted.  Repeat noncontrast chest CT in 6 to 12 months.

## 2021-11-27 NOTE — Assessment & Plan Note (Signed)
Seems to be increasing.  I will recheck again to see if he continues.  Does have difficulty with her diabetes.  No other significant changes noted with medications at this time.

## 2021-12-08 ENCOUNTER — Other Ambulatory Visit: Payer: Self-pay

## 2021-12-10 ENCOUNTER — Other Ambulatory Visit: Payer: Self-pay

## 2021-12-10 ENCOUNTER — Encounter: Payer: Self-pay | Admitting: Critical Care Medicine

## 2021-12-10 ENCOUNTER — Ambulatory Visit: Payer: Self-pay | Attending: Critical Care Medicine | Admitting: Critical Care Medicine

## 2021-12-10 VITALS — BP 119/77 | HR 70 | Ht 63.0 in | Wt 173.6 lb

## 2021-12-10 DIAGNOSIS — I110 Hypertensive heart disease with heart failure: Secondary | ICD-10-CM

## 2021-12-10 DIAGNOSIS — Z1231 Encounter for screening mammogram for malignant neoplasm of breast: Secondary | ICD-10-CM

## 2021-12-10 DIAGNOSIS — T466X5A Adverse effect of antihyperlipidemic and antiarteriosclerotic drugs, initial encounter: Secondary | ICD-10-CM

## 2021-12-10 DIAGNOSIS — I10 Essential (primary) hypertension: Secondary | ICD-10-CM

## 2021-12-10 DIAGNOSIS — G720 Drug-induced myopathy: Secondary | ICD-10-CM

## 2021-12-10 DIAGNOSIS — Z1211 Encounter for screening for malignant neoplasm of colon: Secondary | ICD-10-CM

## 2021-12-10 DIAGNOSIS — E890 Postprocedural hypothyroidism: Secondary | ICD-10-CM

## 2021-12-10 DIAGNOSIS — E0789 Other specified disorders of thyroid: Secondary | ICD-10-CM

## 2021-12-10 DIAGNOSIS — E119 Type 2 diabetes mellitus without complications: Secondary | ICD-10-CM

## 2021-12-10 DIAGNOSIS — E1142 Type 2 diabetes mellitus with diabetic polyneuropathy: Secondary | ICD-10-CM

## 2021-12-10 DIAGNOSIS — E1165 Type 2 diabetes mellitus with hyperglycemia: Secondary | ICD-10-CM

## 2021-12-10 DIAGNOSIS — E785 Hyperlipidemia, unspecified: Secondary | ICD-10-CM

## 2021-12-10 DIAGNOSIS — K2100 Gastro-esophageal reflux disease with esophagitis, without bleeding: Secondary | ICD-10-CM

## 2021-12-10 DIAGNOSIS — E1169 Type 2 diabetes mellitus with other specified complication: Secondary | ICD-10-CM

## 2021-12-10 DIAGNOSIS — Z794 Long term (current) use of insulin: Secondary | ICD-10-CM

## 2021-12-10 DIAGNOSIS — I5030 Unspecified diastolic (congestive) heart failure: Secondary | ICD-10-CM

## 2021-12-10 LAB — POCT GLYCOSYLATED HEMOGLOBIN (HGB A1C): HbA1c, POC (controlled diabetic range): 10.8 % — AB (ref 0.0–7.0)

## 2021-12-10 LAB — GLUCOSE, POCT (MANUAL RESULT ENTRY): POC Glucose: 302 mg/dl — AB (ref 70–99)

## 2021-12-10 MED ORDER — AMLODIPINE BESYLATE 10 MG PO TABS
10.0000 mg | ORAL_TABLET | Freq: Every day | ORAL | 1 refills | Status: DC
Start: 2021-12-10 — End: 2022-03-31
  Filled 2021-12-10 – 2022-03-31 (×4): qty 90, 90d supply, fill #0

## 2021-12-10 MED ORDER — TRUE METRIX BLOOD GLUCOSE TEST VI STRP
ORAL_STRIP | 12 refills | Status: DC
  Filled 2021-12-10: qty 100, 25d supply, fill #0

## 2021-12-10 MED ORDER — SYNTHROID 150 MCG PO TABS
150.0000 ug | ORAL_TABLET | Freq: Every day | ORAL | 3 refills | Status: DC
  Filled 2021-12-10: qty 30, 30d supply, fill #0

## 2021-12-10 MED ORDER — SPIRONOLACTONE 25 MG PO TABS
25.0000 mg | ORAL_TABLET | Freq: Every day | ORAL | 2 refills | Status: DC
  Filled 2021-12-10 – 2022-01-29 (×2): qty 30, 30d supply, fill #0

## 2021-12-10 MED ORDER — FUROSEMIDE 20 MG PO TABS
20.0000 mg | ORAL_TABLET | Freq: Two times a day (BID) | ORAL | 2 refills | Status: DC | PRN
  Filled 2021-12-10: qty 60, 30d supply, fill #0

## 2021-12-10 MED ORDER — TRUEPLUS LANCETS 28G MISC
4 refills | Status: DC
  Filled 2021-12-10: qty 100, 25d supply, fill #0

## 2021-12-10 MED ORDER — DAPAGLIFLOZIN PROPANEDIOL 10 MG PO TABS
10.0000 mg | ORAL_TABLET | Freq: Every day | ORAL | 3 refills | Status: DC
  Filled 2021-12-10 (×2): qty 30, 30d supply, fill #0

## 2021-12-10 MED ORDER — TRULICITY 1.5 MG/0.5ML ~~LOC~~ SOAJ
1.5000 mg | SUBCUTANEOUS | 2 refills | Status: DC
  Filled 2021-12-10: qty 2, 28d supply, fill #0
  Filled 2021-12-31 (×2): qty 2, 28d supply, fill #1
  Filled 2022-01-29: qty 2, 28d supply, fill #2

## 2021-12-10 MED ORDER — ACYCLOVIR 400 MG PO TABS: 400.0000 mg | ORAL_TABLET | Freq: Every day | ORAL | 1 refills | Status: DC

## 2021-12-10 MED ORDER — CARVEDILOL 25 MG PO TABS
25.0000 mg | ORAL_TABLET | Freq: Two times a day (BID) | ORAL | 3 refills | Status: DC
  Filled 2021-12-10: qty 180, 90d supply, fill #0

## 2021-12-10 MED ORDER — VITAMIN D (ERGOCALCIFEROL) 1.25 MG (50000 UNIT) PO CAPS
50000.0000 [IU] | ORAL_CAPSULE | ORAL | 4 refills | Status: DC
  Filled 2021-12-10 – 2022-01-29 (×2): qty 12, 84d supply, fill #0
  Filled 2022-04-28: qty 12, 84d supply, fill #1
  Filled 2022-09-19 – 2022-11-03 (×3): qty 12, 84d supply, fill #2

## 2021-12-10 NOTE — Assessment & Plan Note (Signed)
Cannot tolerate statins

## 2021-12-10 NOTE — Assessment & Plan Note (Signed)
Continue current program 

## 2021-12-10 NOTE — Patient Instructions (Addendum)
Resume Trulicity 1 injection weekly sent to our pharmacy downstairs  Stay on Farxiga 10 mg daily  We did not refill Lantus or metformin  For blood pressure stay on the carvedilol, Aldactone, and amlodipine these are working well  We stopped the valsartan   Furosemide is available as needed for swelling this was sent as well  Refills on vitamin D and your thyroid medicine also sent downstairs  Zovirax acyclovir was sent to [facility] in [facility]  The only lab needed today is a urine to check for protein  Continue to follow a healthy diet as you are doing  Keep your follow-up with the thyroid doctor at [facility] surgery  Return to see Dr. Joya Gaskins 4 months  Would like for you to see Lurena Joiner our clinical pharmacist in the next 6 weeks for follow-up of your diabetes  Colon cancer screening kit was issued at this visit  You need a mammogram they will call you to schedule you will get this through the scholarship program and will not be any cost to you

## 2021-12-10 NOTE — Progress Notes (Signed)
Established Patient Office Visit  Subjective:  Patient ID: Suzanne Cline, female    DOB: 1965/07/13  Age: 56 y.o. MRN: [id number]   History of Present Illness:   CC: Post hospital follow-up  HPI Suzanne Cline presents for post hospital follow-up.  The patient recently in November underwent total hysterectomy and she states since that time there is been some improvement in her pelvic pain but she continues to have heartburn symptoms fullness in the upper abdominal area with upper abdominal discomfort.  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  She still has short-term disability that is set to run out this week.  She is can apply for unemployment.  Her last day of work was November 14, 2020.  We she did have FMLA but this ran out in October.  She states her blood sugars at home run 95-100.  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  She was recommended to be on Carafate and proton pump inhibitors she is off these at this time.  She notes increased urinary frequency pain in the upper gastrointestinal tract and swelling in the abdomen.  Her abdominal process from the lower abdomen is improving after the hysterectomy for severe uterine fibroids.   8/16 Patient is seen in return follow-up not been seen since December 2022.  On arrival blood pressure is 119/77 and blood sugar is elevated 302.  She has been off all of her diabetic medications for the past month as she ran out of medications and cannot obtain refills.  Note patient is uninsured will need patient assistance for her medications.  Another issue is she stopped taking the Lantus because she was gaining weight with it and did not like this sensation.  The patient has adopted a lifestyle management process and she is eating a plant-based diet at this time and trying to exercise more.  Since the last visit she did have a total hysterectomy and  salpingo for ectomy she still has her ovaries.  This was for uterine fibroids.  The abdominal pain did not improve significantly with this procedure as was anticipated.  The patient also was seen by gastroenterology who ultimately referred her to sports medicine for her arthritis type symptoms.  She was found to have a polyarthralgia syndrome with no specific type of osteoarthritis and sports medicine.  She has an elevated sed rate.  The work-up also showed a new thyroid nodule at the base of her previous partial thyroid resection.  She has been referred to the thyroid surgeon for further evaluation of same.  Patient is interested in resuming some type of diabetic medication at this time.  She would like to avoid further insulin products.  On arrival her A1c is elevated at 10.  Patient's blood pressure program does consist ofCarvedilol spironolactone and amlodipine.  The patient has stopped taking Diovan Note patient has statin induced myopathy cannot take statins also cannot tolerate metformin even XR formant   Past Medical History:  Diagnosis Date   CHF (congestive heart failure) (HCC)    Diabetes mellitus without complication ([place])    Difficult intubation    Family history of adverse reaction to anesthesia    Fibroids, intramural    s/p uterine artery embolization   Former cigarette smoker 04/2011   History of Resolved Nonischemic Congestive Cardiomyopathy ([place]) 04/2011   Echo March 2013: EF 35% with mild/moderate diastolic dysfunction -> Follow-Up Echo March 2014  EF 55% with G1 DD.   Hypertension    Hypertensive heart disease    Difficult control hypertension   Hypothyroidism    Mild concentric left ventricular hypertrophy (LVH) 10/08/2019   Moderate mitral valve regurgitation 10/08/2019   Echo done June 2021; Dr. Ellyn Hack   Neuromuscular disorder [facility])    diabetic neuropathy   OSA (obstructive sleep apnea)    Sleep apnea    doesn't use cpap regularly   Thyroid disease    post  thyroidectomy    Past Surgical History:  Procedure Laterality Date   ABLATION     uterus   COLONOSCOPY     2017 -  normal: in [place], [place]     for fibroids   DILATION AND CURETTAGE OF UTERUS     HYSTERECTOMY ABDOMINAL WITH SALPINGECTOMY Bilateral 03/04/2021   Procedure: HYSTERECTOMY ABDOMINAL WITH BILATERAL SALPINGECTOMY;  Surgeon: [name], MD;  Location: [place];  Service: Gynecology;  Laterality: Bilateral;  TAP BLOCK   LEFT HEART CATH AND CORONARY ANGIOGRAPHY  05/19/2011   ([facility]): Normal coronary arteries   THYROIDECTOMY     TRANSTHORACIC ECHOCARDIOGRAM  07/18/2011   [facility] [place]: EF 35% with mild to moderate DD. -->   TRANSTHORACIC ECHOCARDIOGRAM     [facility] [place]: EF 55%, G1 DD.  LVIDd 4.3; July 12, 2015: EF 55 to 60%.  Normal valves   TUBAL LIGATION     UPPER GASTROINTESTINAL ENDOSCOPY  07/06/2019    Family History  Problem Relation Age of Onset   Congestive Heart Failure Mother    Breast cancer Neg Hx    Colon cancer Neg Hx    Esophageal cancer Neg Hx    Rectal cancer Neg Hx    Stomach cancer Neg Hx    Inflammatory bowel disease Neg Hx    Liver disease Neg Hx    Pancreatic cancer Neg Hx     Social History   Socioeconomic History   Marital status: Single    Spouse name: Not on file   Number of children: 2   Years of education: Not on file   Highest education level: Not on file  Occupational History   Not on file  Tobacco Use   Smoking status: Former    Types: Cigars    Quit date: 03/2019    Years since quitting: 2.7   Smokeless tobacco: Never   Tobacco comments:    smokes occasional cigar  Vaping Use   Vaping Use: Every day   Devices: hemp  Substance and Sexual Activity   Alcohol use: Yes    Comment: social   Drug use: Yes    Types: Marijuana   Sexual activity: Not Currently  Other Topics Concern   Not on file  Social History Narrative   Not on file   Social  Determinants of Health   Financial Resource Strain: Not on file  Food Insecurity: No Food Insecurity (03/31/2021)   Hunger Vital Sign    Worried About Running Out of Food in the Last Year: Never true    Ran Out of Food in the Last Year: Never true  Transportation Needs: No Transportation Needs (03/31/2021)   PRAPARE - Hydrologist (Medical): No    Lack of Transportation (Non-Medical): No  Physical Activity: Not on file  Stress: Not on file  Social Connections: Not on file  Intimate Partner Violence: Not on file    Outpatient Medications Prior to Visit  Medication Sig Dispense Refill   acyclovir (ZOVIRAX) 400 MG tablet Take 1 tablet (400 mg total) by mouth 5 (five) times daily. 60 tablet 1   amLODipine (NORVASC) 10 MG tablet Take 1 tablet (10 mg total) by mouth daily. 90 tablet 1   carvedilol (COREG) 25 MG tablet Take 1 tablet (25 mg total) by mouth 2 (two) times daily with a meal. 180 tablet 3   Dulaglutide (TRULICITY) 1.5 MV/6.7MC SOPN Inject 1.5 mg into the skin once a week. 2 mL 0   furosemide (LASIX) 20 MG tablet Take 1 tablet (20 mg total) by mouth 2 (two) times daily as needed. 60 tablet 2   glucose blood (TRUE METRIX BLOOD GLUCOSE TEST) test strip Use as instructed 100 each 12   hyoscyamine (LEVSIN) 0.125 MG tablet Take 1 tablet (0.125 mg total) by mouth every 4 (four) hours as needed for cramping. 60 tablet 2   insulin glargine (LANTUS) 100 unit/mL SOPN Inject 30 Units into the skin at bedtime. 6 mL 2   metroNIDAZOLE (FLAGYL) 500 MG tablet Take 1 tablet (500 mg total) by mouth 2 (two) times daily. 20 tablet 0   spironolactone (ALDACTONE) 25 MG tablet Take 1 tablet (25 mg total) by mouth daily. 30 tablet 2   SYNTHROID 150 MCG tablet Take 1 tablet (150 mcg total) by mouth daily. 90 tablet 3   TRUEplus Lancets 28G MISC Use as directed 100 each 4   valsartan (DIOVAN) 320 MG tablet Take 1 tablet (320 mg total) by mouth daily. 90 tablet 3   Vitamin D,  Ergocalciferol, (DRISDOL) 1.25 MG (50000 UNIT) CAPS capsule Take 1 capsule (50,000 Units total) by mouth every 7 (seven) days. 12 capsule 0   No facility-administered medications prior to visit.    Allergies  Allergen Reactions   Statins Other (See Comments)    Myopathy   Chlorhexidine     Itching,redness   Lisinopril Cough   Oxycodone Itching    ROS Review of Systems  Constitutional:  Positive for unexpected weight change.  HENT: Negative.  Negative for ear pain, postnasal drip, rhinorrhea, sinus pressure, sore throat, trouble swallowing and voice change.   Eyes: Negative.   Respiratory: Negative.  Negative for apnea, cough, choking, chest tightness, shortness of breath, wheezing and stridor.   Cardiovascular: Negative.  Negative for chest pain, palpitations and leg swelling.  Gastrointestinal:  Positive for abdominal pain. Negative for abdominal distention, nausea and vomiting.       Reflux symptoms  Genitourinary: Negative.   Musculoskeletal:  Positive for arthralgias and myalgias.  Skin: Negative.  Negative for rash.  Allergic/Immunologic: Negative.  Negative for environmental allergies and food allergies.  Neurological: Negative.  Negative for dizziness, syncope, weakness and headaches.  Hematological: Negative.  Negative for adenopathy. Does not bruise/bleed easily.  Psychiatric/Behavioral: Negative.  Negative for agitation and sleep disturbance. The patient is not nervous/anxious.       Objective:    Physical Exam Vitals reviewed.  Constitutional:      Appearance: Normal appearance. She is well-developed. She is obese. She is not diaphoretic.  HENT:     Head: Normocephalic and atraumatic.     Nose: No nasal deformity, septal deviation, mucosal edema or rhinorrhea.     Right Sinus: No maxillary sinus tenderness or frontal sinus tenderness.     Left Sinus: No maxillary sinus tenderness or frontal sinus tenderness.     Mouth/Throat:     Pharynx: No oropharyngeal  exudate.  Eyes:     General:  No scleral icterus.    Conjunctiva/sclera: Conjunctivae normal.     Pupils: Pupils are equal, round, and reactive to light.  Neck:     Thyroid: No thyromegaly.     Vascular: No carotid bruit or JVD.     Trachea: Trachea normal. No tracheal tenderness or tracheal deviation.  Cardiovascular:     Rate and Rhythm: Normal rate and regular rhythm.     Chest Wall: PMI is not displaced.     Pulses: Normal pulses. No decreased pulses.     Heart sounds: Normal heart sounds, S1 normal and S2 normal. Heart sounds not distant. No murmur heard.    No systolic murmur is present.     No diastolic murmur is present.     No friction rub. No gallop. No S3 or S4 sounds.  Pulmonary:     Effort: No tachypnea, accessory muscle usage or respiratory distress.     Breath sounds: No stridor. No decreased breath sounds, wheezing, rhonchi or rales.  Chest:     Chest wall: No tenderness.  Abdominal:     General: Bowel sounds are normal. There is no distension.     Palpations: Abdomen is soft. Abdomen is not rigid.     Tenderness: There is no abdominal tenderness. There is no guarding or rebound.  Musculoskeletal:        General: Normal range of motion.     Cervical back: Normal range of motion and neck supple. No edema, erythema or rigidity. No muscular tenderness. Normal range of motion.  Lymphadenopathy:     Head:     Right side of head: No submental or submandibular adenopathy.     Left side of head: No submental or submandibular adenopathy.     Cervical: No cervical adenopathy.  Skin:    General: Skin is warm and dry.     Coloration: Skin is not pale.     Findings: No rash.     Nails: There is no clubbing.  Neurological:     Mental Status: She is alert and oriented to person, place, and time.     Sensory: No sensory deficit.  Psychiatric:        Speech: Speech normal.        Behavior: Behavior normal.    No exam this is a phone visit BP 119/77   Pulse 70   Ht 5'  3" (1.6 m)   Wt 173 lb 9.6 oz (78.7 kg)   SpO2 92%   BMI 30.75 kg/m  Wt Readings from Last 3 Encounters:  12/10/21 173 lb 9.6 oz (78.7 kg)  11/27/21 172 lb (78 kg)  10/09/21 171 lb (77.6 kg)     Health Maintenance Due  Topic Date Due   COVID-19 Vaccine (1) Never done   URINE MICROALBUMIN  Never done   MAMMOGRAM  03/29/2021   COLON CANCER SCREENING ANNUAL FOBT  05/02/2021   OPHTHALMOLOGY EXAM  10/08/2021    There are no preventive care reminders to display for this patient.  Lab Results  Component Value Date   TSH 1.71 09/10/2021   Lab Results  Component Value Date   WBC 6.7 09/10/2021   HGB 12.1 09/10/2021   HCT 36.9 09/10/2021   MCV 84.2 09/10/2021   PLT 221.0 09/10/2021   Lab Results  Component Value Date   NA 134 (L) 11/27/2021   K 3.8 11/27/2021   CO2 28 11/27/2021   GLUCOSE 317 (H) 11/27/2021   BUN 12 11/27/2021   CREATININE 1.04  11/27/2021   BILITOT 0.4 11/27/2021   ALKPHOS 85 11/27/2021   AST 19 11/27/2021   ALT 24 11/27/2021   PROT 8.0 11/27/2021   ALBUMIN 4.4 11/27/2021   CALCIUM 9.3 11/27/2021   ANIONGAP 7 03/06/2021   GFR 59.98 (L) 11/27/2021   No results found for: "CHOL" No results found for: "HDL" No results found for: "LDLCALC" No results found for: "TRIG" No results found for: "CHOLHDL" Lab Results  Component Value Date   HGBA1C 10.8 (A) 12/10/2021      Assessment & Plan:   Problem List Items Addressed This Visit       Cardiovascular and Mediastinum   Hypertensive heart disease with diastolic heart failure (HCC) (Chronic)    Compensated at this time no change in medicine      Relevant Medications   amLODipine (NORVASC) 10 MG tablet   carvedilol (COREG) 25 MG tablet   furosemide (LASIX) 20 MG tablet   spironolactone (ALDACTONE) 25 MG tablet   Essential hypertension (Chronic)    Blood pressure at goal we will continue current program and discontinue Diovan      Relevant Medications   amLODipine (NORVASC) 10 MG tablet    carvedilol (COREG) 25 MG tablet   furosemide (LASIX) 20 MG tablet   spironolactone (ALDACTONE) 25 MG tablet     Digestive   GERD (gastroesophageal reflux disease)    Continue current program        Endocrine   Type 2 diabetes mellitus without complication, with long-term current use of insulin (HCC) - Primary (Chronic)    We will resume Farxiga 10 mg daily and resume Trulicity 1.5 mg weekly Check urine for microalbumin  follow-up with clinical pharmacy Patient to continue to follow lifestyle management  The following Lifestyle Medicine recommendations according to [place] of Lifestyle Medicine [facility]) were discussed and offered to patient who agrees to start the journey:  A. Whole Foods, Plant-based plate comprising of fruits and vegetables, plant-based proteins, whole-grain carbohydrates was discussed in detail with the patient.   A list for source of those nutrients were also provided to the patient.  Patient will use only water or unsweetened tea for hydration. B.  The need to stay away from risky substances including alcohol, smoking; obtaining 7 to 9 hours of restorative sleep, at least 150 minutes of moderate intensity exercise weekly, the importance of healthy social connections,  and stress reduction techniques were discussed. C.  A full color page of  Calorie density of various food groups per pound showing examples of each food groups was provided to the patient.       Relevant Medications   Dulaglutide (TRULICITY) 1.5 KX/3.8HW SOPN   dapagliflozin propanediol (FARXIGA) 10 MG TABS tablet   Other Relevant Orders   POCT glucose (manual entry) (Completed)   POCT glycosylated hemoglobin (Hb A1C) (Completed)   Microalbumin / creatinine urine ratio   Hyperlipidemia associated with type 2 diabetes mellitus ([place]) (Chronic)    The following Lifestyle Medicine recommendations according to [place] of Lifestyle Medicine [facility]) were discussed and offered to patient who  agrees to start the journey:  A. Whole Foods, Plant-based plate comprising of fruits and vegetables, plant-based proteins, whole-grain carbohydrates was discussed in detail with the patient.   A list for source of those nutrients were also provided to the patient.  Patient will use only water or unsweetened tea for hydration. B.  The need to stay away from risky substances including alcohol, smoking; obtaining 7 to 9 hours  of restorative sleep, at least 150 minutes of moderate intensity exercise weekly, the importance of healthy social connections,  and stress reduction techniques were discussed. C.  A full color page of  Calorie density of various food groups per pound showing examples of each food groups was provided to the patient.         Relevant Medications   amLODipine (NORVASC) 10 MG tablet   carvedilol (COREG) 25 MG tablet   Dulaglutide (TRULICITY) 1.5 UD/4.3TC SOPN   furosemide (LASIX) 20 MG tablet   spironolactone (ALDACTONE) 25 MG tablet   dapagliflozin propanediol (FARXIGA) 10 MG TABS tablet   Postoperative hypothyroidism    Patient with a new thyroid nodule has referral to general surgery for evaluation  Continue with current dose of Synthroid      Relevant Medications   carvedilol (COREG) 25 MG tablet   SYNTHROID 150 MCG tablet   Diabetic polyneuropathy associated with type 2 diabetes mellitus ([place])    Improved with diabetic control      Relevant Medications   Dulaglutide (TRULICITY) 1.5 KF/2.5LT SOPN   dapagliflozin propanediol (FARXIGA) 10 MG TABS tablet     Musculoskeletal and Integument   Statin myopathy    Cannot tolerate statins        Other   Thyroid mass of unclear etiology    As per hypothyroidism assessment      Other Visit Diagnoses     Type 2 diabetes mellitus with hyperglycemia, without long-term current use of insulin (HCC)       Relevant Medications   Dulaglutide (TRULICITY) 1.5 GA/8.9KS SOPN   glucose blood (TRUE METRIX BLOOD GLUCOSE  TEST) test strip   TRUEplus Lancets 28G MISC   dapagliflozin propanediol (FARXIGA) 10 MG TABS tablet   Colon cancer screening       Relevant Orders   Fecal occult blood, imunochemical   Encounter for screening mammogram for malignant neoplasm of breast       Relevant Orders   MM Digital Diagnostic Bilat      Follow-up planned in 4 months but will see our clinical pharmacist short-term 38 minutes spent multiple systems assessed Asencion Noble   Mammogram was ordered fecal occult kit for colon cancer screening was issued

## 2021-12-10 NOTE — Assessment & Plan Note (Signed)
As per hypothyroidism assessment

## 2021-12-10 NOTE — Assessment & Plan Note (Signed)
Patient with a new thyroid nodule has referral to general surgery for evaluation  Continue with current dose of Synthroid

## 2021-12-10 NOTE — Assessment & Plan Note (Signed)
Improved with diabetic control

## 2021-12-10 NOTE — Assessment & Plan Note (Addendum)
We will resume Farxiga 10 mg daily and resume Trulicity 1.5 mg weekly Check urine for microalbumin  follow-up with clinical pharmacy Patient to continue to follow lifestyle management  The following Lifestyle Medicine recommendations according to [place] [facility]) were discussed and offered to patient who agrees to start the journey:  A. Whole Foods, Plant-based plate comprising of fruits and vegetables, plant-based proteins, whole-grain carbohydrates was discussed in detail with the patient.   A list for source of those nutrients were also provided to the patient.  Patient will use only water or unsweetened tea for hydration. B.  The need to stay away from risky substances including alcohol, smoking; obtaining 7 to 9 hours of restorative sleep, at least 150 minutes of moderate intensity exercise weekly, the importance of healthy social connections,  and stress reduction techniques were discussed. C.  A full color page of  Calorie density of various food groups per pound showing examples of each food groups was provided to the patient.

## 2021-12-10 NOTE — Assessment & Plan Note (Signed)
Compensated at this time no change in medicine

## 2021-12-10 NOTE — Assessment & Plan Note (Signed)
Blood pressure at goal we will continue current program and discontinue Diovan

## 2021-12-10 NOTE — Assessment & Plan Note (Signed)

## 2021-12-11 ENCOUNTER — Other Ambulatory Visit: Payer: Self-pay

## 2021-12-11 LAB — MICROALBUMIN / CREATININE URINE RATIO
Creatinine, Urine: 33.3 mg/dL
Microalb/Creat Ratio: 564 mg/g creat — ABNORMAL HIGH (ref 0–29)
Microalbumin, Urine: 187.9 ug/mL

## 2021-12-12 ENCOUNTER — Telehealth: Payer: Self-pay

## 2021-12-12 ENCOUNTER — Other Ambulatory Visit: Payer: Self-pay

## 2021-12-12 NOTE — Telephone Encounter (Signed)
-----   Message from [name], MD sent at 12/12/2021  8:08 AM EDT ----- Let pt know there is protein in the urine from diabetes should improve with diabetes control

## 2021-12-12 NOTE — Telephone Encounter (Signed)
Pt was called and is aware of results, DOB was confirmed.  ?

## 2021-12-12 NOTE — Progress Notes (Signed)
Let pt know there is protein in the urine from diabetes should improve with diabetes control

## 2021-12-16 ENCOUNTER — Other Ambulatory Visit: Payer: Self-pay

## 2021-12-19 ENCOUNTER — Encounter: Payer: Self-pay | Admitting: Family Medicine

## 2021-12-19 ENCOUNTER — Other Ambulatory Visit: Payer: Self-pay

## 2021-12-19 NOTE — Progress Notes (Unsigned)
[name] [address] [place] Phone: [phone] Subjective:   Suzanne Cline, am serving as a scribe for Dr. Hulan Saas.  I'm seeing this patient by the request  of:  Suzanne Stain, MD  CC: thyroid mass follow up   [id number]  11/27/2021 Seems to be increasing.  I will recheck again to see if he continues.  Does have difficulty with her diabetes.  No other significant changes noted with medications at this time.  Small nodule noted in the left to the trachea at the thoracic inlet is nonspecific.  15 x 17 mm nodule noted.  On the cusp of the necessary for biopsy.  We will send to general surgery to further evaluate.  Had a significant increase in ESR and we will check as well.  Patient does have increase in density noted.  Repeat noncontrast chest CT in 6 to 12 months.  Updated 12/22/2021 Suzanne Cline is a 56 y.o. female coming in with complaint of polyarthralgia. Has been doing okay. Can feel the inflammation. Here to talk about nodule.  Patient ultrasound did have nonspecific finding.  Significant increase in sedimentation rate.  Having difficulty finding someone who will accept her insurance.     Past Medical History:  Diagnosis Date   CHF (congestive heart failure) ([place])    Diabetes mellitus without complication ([place])    Difficult intubation    Family history of adverse reaction to anesthesia    Fibroids, intramural    s/p uterine artery embolization   Former cigarette smoker 04/2011   History of Resolved Nonischemic Congestive Cardiomyopathy ([place]) 04/2011   Echo March 2013: EF 35% with mild/moderate diastolic dysfunction -> Follow-Up Echo March 2014 EF 55% with G1 DD.   Hypertension    Hypertensive heart disease    Difficult control hypertension   Hypothyroidism    Mild concentric left ventricular hypertrophy (LVH) 10/08/2019   Moderate mitral valve regurgitation 10/08/2019   Echo done June 2021; Dr.  Ellyn Hack   Neuromuscular disorder [facility])    diabetic neuropathy   OSA (obstructive sleep apnea)    Sleep apnea    doesn't use cpap regularly   Thyroid disease    post thyroidectomy   Past Surgical History:  Procedure Laterality Date   ABLATION     uterus   COLONOSCOPY     2017 -  normal: in [place], [place]     for fibroids   DILATION AND CURETTAGE OF UTERUS     HYSTERECTOMY ABDOMINAL WITH SALPINGECTOMY Bilateral 03/04/2021   Procedure: HYSTERECTOMY ABDOMINAL WITH BILATERAL SALPINGECTOMY;  Surgeon: [name], MD;  Location: [facility];  Service: Gynecology;  Laterality: Bilateral;  TAP BLOCK   LEFT HEART CATH AND CORONARY ANGIOGRAPHY  05/19/2011   ([facility]): Normal coronary arteries   THYROIDECTOMY     TRANSTHORACIC ECHOCARDIOGRAM  07/18/2011   [facility] [place]: EF 35% with mild to moderate DD. -->   TRANSTHORACIC ECHOCARDIOGRAM     [facility] [place]: EF 55%, G1 DD.  LVIDd 4.3; July 12, 2015: EF 55 to 60%.  Normal valves   TUBAL LIGATION     UPPER GASTROINTESTINAL ENDOSCOPY  07/06/2019   Social History   Socioeconomic History   Marital status: Single    Spouse name: Not on file   Number of children: 2   Years of education: Not on file   Highest education level: Not on file  Occupational History  Not on file  Tobacco Use   Smoking status: Former    Types: Cigars    Quit date: 03/2019    Years since quitting: 2.7   Smokeless tobacco: Never   Tobacco comments:    smokes occasional cigar  Vaping Use   Vaping Use: Every day   Devices: hemp  Substance and Sexual Activity   Alcohol use: Yes    Comment: social   Drug use: Yes    Types: Marijuana   Sexual activity: Not Currently  Other Topics Concern   Not on file  Social History Narrative   Not on file   Social Determinants of Health   Financial Resource Strain: Not on file  Food Insecurity: No Food Insecurity (03/31/2021)   Hunger Vital Sign     Worried About Running Out of Food in the Last Year: Never true    Ran Out of Food in the Last Year: Never true  Transportation Needs: No Transportation Needs (03/31/2021)   PRAPARE - Hydrologist (Medical): No    Lack of Transportation (Non-Medical): No  Physical Activity: Not on file  Stress: Not on file  Social Connections: Not on file   Allergies  Allergen Reactions   Statins Other (See Comments)    Myopathy   Chlorhexidine     Itching,redness   Lisinopril Cough   Oxycodone Itching   Family History  Problem Relation Age of Onset   Congestive Heart Failure Mother    Breast cancer Neg Hx    Colon cancer Neg Hx    Esophageal cancer Neg Hx    Rectal cancer Neg Hx    Stomach cancer Neg Hx    Inflammatory bowel disease Neg Hx    Liver disease Neg Hx    Pancreatic cancer Neg Hx     Current Outpatient Medications (Endocrine & Metabolic):    dapagliflozin propanediol (FARXIGA) 10 MG TABS tablet, Take 1 tablet (10 mg total) by mouth daily before breakfast.   Dulaglutide (TRULICITY) 1.5 ZH/2.9JM SOPN, Inject 1.5 mg into the skin once a week.   SYNTHROID 150 MCG tablet, Take 1 tablet (150 mcg total) by mouth daily.  Current Outpatient Medications (Cardiovascular):    amLODipine (NORVASC) 10 MG tablet, Take 1 tablet (10 mg total) by mouth daily.   carvedilol (COREG) 25 MG tablet, Take 1 tablet (25 mg total) by mouth 2 (two) times daily with a meal.   furosemide (LASIX) 20 MG tablet, Take 1 tablet (20 mg total) by mouth 2 (two) times daily as needed.   spironolactone (ALDACTONE) 25 MG tablet, Take 1 tablet (25 mg total) by mouth daily.     Current Outpatient Medications (Other):    acyclovir (ZOVIRAX) 400 MG tablet, Take 1 tablet (400 mg total) by mouth 5 (five) times daily.   glucose blood (TRUE METRIX BLOOD GLUCOSE TEST) test strip, Use as instructed   TRUEplus Lancets 28G MISC, Use as directed   Vitamin D, Ergocalciferol, (DRISDOL) 1.25 MG (50000  UNIT) CAPS capsule, Take 1 capsule (50,000 Units total) by mouth every 7 (seven) days.   Reviewed prior external information including notes and imaging from  primary care provider As well as notes that were available from care everywhere and other healthcare systems.  Past medical history, social, surgical and family history all reviewed in electronic medical record.  No pertanent information unless stated regarding to the chief complaint.   Review of Systems:  No headache, visual changes, nausea, vomiting, diarrhea, constipation, dizziness,  abdominal pain, skin rash, fevers, chills, night sweats, weight loss, swollen lymph nodes, body aches, joint swelling, chest pain, shortness of breath, mood changes. POSITIVE muscle aches  Objective  Blood pressure 122/84, pulse 88, height 5' 3"  (1.6 m), weight 170 lb (77.1 kg), SpO2 95 %.   General: No apparent distress alert and oriented x3 mood and affect normal, dressed appropriately.  HEENT: Pupils equal, extraocular movements intact  Respiratory: Patient's speak in full sentences and does not appear short of breath  Cardiovascular: No lower extremity edema, non tender, no erythema   Neck exam still has significant fullness noted.   Impression and Recommendations:     The above documentation has been reviewed and is accurate and complete Lyndal Pulley, DO

## 2021-12-22 ENCOUNTER — Encounter: Payer: Self-pay | Admitting: Critical Care Medicine

## 2021-12-22 ENCOUNTER — Ambulatory Visit (INDEPENDENT_AMBULATORY_CARE_PROVIDER_SITE_OTHER): Payer: Self-pay | Admitting: Family Medicine

## 2021-12-22 VITALS — BP 122/84 | HR 88 | Ht 63.0 in | Wt 170.0 lb

## 2021-12-22 DIAGNOSIS — M2550 Pain in unspecified joint: Secondary | ICD-10-CM

## 2021-12-22 DIAGNOSIS — E0789 Other specified disorders of thyroid: Secondary | ICD-10-CM

## 2021-12-22 DIAGNOSIS — E041 Nontoxic single thyroid nodule: Secondary | ICD-10-CM

## 2021-12-22 DIAGNOSIS — R700 Elevated erythrocyte sedimentation rate: Secondary | ICD-10-CM

## 2021-12-22 LAB — COMPREHENSIVE METABOLIC PANEL
ALT: 19 U/L (ref 0–35)
AST: 21 U/L (ref 0–37)
Albumin: 4.6 g/dL (ref 3.5–5.2)
Alkaline Phosphatase: 77 U/L (ref 39–117)
BUN: 16 mg/dL (ref 6–23)
CO2: 29 mEq/L (ref 19–32)
Calcium: 9.9 mg/dL (ref 8.4–10.5)
Chloride: 100 mEq/L (ref 96–112)
Creatinine, Ser: 1.28 mg/dL — ABNORMAL HIGH (ref 0.40–1.20)
GFR: 46.73 mL/min — ABNORMAL LOW (ref >60.00)
Glucose, Bld: 131 mg/dL — ABNORMAL HIGH (ref 70–99)
Potassium: 3.9 mEq/L (ref 3.5–5.1)
Sodium: 136 mEq/L (ref 135–145)
Total Bilirubin: 0.5 mg/dL (ref 0.2–1.2)
Total Protein: 8 g/dL (ref 6.0–8.3)

## 2021-12-22 LAB — SEDIMENTATION RATE: Sed Rate: 44 mm/hr — ABNORMAL HIGH (ref 0–30)

## 2021-12-22 NOTE — Assessment & Plan Note (Signed)
Patient still has a thyroid mass of unclear etiology.  Patient did not have the removal of the thyroid.  Due to patient having elevation in the sedimentation rate as well as the continued feeling I do feel that further evaluation by general surgery for biopsy and possible removal will be necessary.  Follow-up with me again after seeing general surgery.  Had difficulty secondary to patient's insurance.

## 2021-12-22 NOTE — Patient Instructions (Addendum)
Labs today Hopefully you'll here from referral soon Try to take better control of blood sugar

## 2021-12-22 NOTE — Assessment & Plan Note (Signed)
Recheck to see if it is upward trending or downward trending.

## 2021-12-23 ENCOUNTER — Other Ambulatory Visit: Payer: Self-pay

## 2021-12-23 MED ORDER — DAPAGLIFLOZIN PROPANEDIOL 10 MG PO TABS
10.0000 mg | ORAL_TABLET | Freq: Every day | ORAL | 3 refills | Status: DC
  Filled 2021-12-23: qty 30, 30d supply, fill #0

## 2021-12-23 NOTE — Telephone Encounter (Signed)
Luke pls find out if they will fill the [place] for this pt, I sent Rx pt was called by pharmacy co and said she was approved PASS  pls f/u on this

## 2021-12-24 ENCOUNTER — Other Ambulatory Visit: Payer: Self-pay

## 2021-12-31 ENCOUNTER — Other Ambulatory Visit: Payer: Self-pay

## 2021-12-31 ENCOUNTER — Other Ambulatory Visit (HOSPITAL_COMMUNITY): Payer: Self-pay

## 2022-01-13 ENCOUNTER — Other Ambulatory Visit: Payer: Self-pay

## 2022-01-13 ENCOUNTER — Other Ambulatory Visit: Payer: Self-pay | Admitting: Pharmacist

## 2022-01-13 MED ORDER — DAPAGLIFLOZIN PROPANEDIOL 10 MG PO TABS
10.0000 mg | ORAL_TABLET | Freq: Every day | ORAL | 3 refills | Status: DC
Start: 2022-01-13 — End: 2022-01-30

## 2022-01-15 ENCOUNTER — Other Ambulatory Visit: Payer: Self-pay

## 2022-01-16 ENCOUNTER — Other Ambulatory Visit: Payer: Self-pay

## 2022-01-19 ENCOUNTER — Telehealth: Payer: Self-pay

## 2022-01-19 NOTE — Telephone Encounter (Signed)
Telephoned patient at home number. Left voice message with [facility] (scholarship) callback information.

## 2022-01-22 ENCOUNTER — Ambulatory Visit: Payer: Self-pay | Admitting: Pharmacist

## 2022-01-29 ENCOUNTER — Encounter: Payer: Self-pay | Admitting: Critical Care Medicine

## 2022-01-29 ENCOUNTER — Other Ambulatory Visit: Payer: Self-pay

## 2022-01-29 NOTE — Telephone Encounter (Signed)
Suzanne Cline [name] what is status on this pts farxiga

## 2022-01-30 ENCOUNTER — Other Ambulatory Visit: Payer: Self-pay

## 2022-01-30 ENCOUNTER — Other Ambulatory Visit: Payer: Self-pay | Admitting: Pharmacist

## 2022-01-30 MED ORDER — DAPAGLIFLOZIN PROPANEDIOL 10 MG PO TABS: 10.0000 mg | ORAL_TABLET | Freq: Every day | ORAL | 3 refills | Status: DC

## 2022-02-02 ENCOUNTER — Other Ambulatory Visit: Payer: Self-pay

## 2022-02-02 MED ORDER — LOSARTAN POTASSIUM 25 MG PO TABS
25.0000 mg | ORAL_TABLET | Freq: Every day | ORAL | 2 refills | Status: DC
  Filled 2022-02-02: qty 90, 90d supply, fill #0

## 2022-02-02 NOTE — Addendum Note (Signed)
Addended by: Asencion Noble E on: 02/02/2022 12:48 PM   Modules accepted: Orders

## 2022-02-03 ENCOUNTER — Other Ambulatory Visit: Payer: Self-pay

## 2022-02-03 MED ORDER — LOSARTAN POTASSIUM 25 MG PO TABS: 25.0000 mg | ORAL_TABLET | Freq: Every day | ORAL | 2 refills | Status: DC

## 2022-02-03 NOTE — Addendum Note (Signed)
Addended by: [name] on: 02/03/2022 05:42 AM   Modules accepted: Orders

## 2022-02-16 ENCOUNTER — Other Ambulatory Visit: Payer: Self-pay

## 2022-02-16 ENCOUNTER — Other Ambulatory Visit: Payer: Self-pay | Admitting: Pharmacist

## 2022-02-16 MED ORDER — DAPAGLIFLOZIN PROPANEDIOL 10 MG PO TABS
10.0000 mg | ORAL_TABLET | Freq: Every day | ORAL | 6 refills | Status: DC
  Filled 2022-02-16: qty 30, 30d supply, fill #0
  Filled 2022-03-29 – 2022-04-08 (×2): qty 30, 30d supply, fill #1
  Filled 2022-04-28 – 2022-05-27 (×2): qty 30, 30d supply, fill #2

## 2022-02-27 ENCOUNTER — Other Ambulatory Visit: Payer: Self-pay | Admitting: Critical Care Medicine

## 2022-02-27 ENCOUNTER — Other Ambulatory Visit: Payer: Self-pay

## 2022-02-27 MED ORDER — TRULICITY 1.5 MG/0.5ML ~~LOC~~ SOAJ
1.5000 mg | SUBCUTANEOUS | 2 refills | Status: DC
  Filled 2022-02-27: qty 2, 28d supply, fill #0
  Filled 2022-03-29 – 2022-03-31 (×2): qty 2, 28d supply, fill #1
  Filled 2022-04-28 – 2022-05-06 (×2): qty 2, 28d supply, fill #2

## 2022-03-03 ENCOUNTER — Other Ambulatory Visit: Payer: Self-pay

## 2022-03-30 ENCOUNTER — Other Ambulatory Visit (HOSPITAL_COMMUNITY): Payer: Self-pay

## 2022-03-31 ENCOUNTER — Other Ambulatory Visit: Payer: Self-pay

## 2022-03-31 ENCOUNTER — Other Ambulatory Visit (HOSPITAL_COMMUNITY): Payer: Self-pay

## 2022-03-31 ENCOUNTER — Other Ambulatory Visit: Payer: Self-pay | Admitting: Pharmacist

## 2022-03-31 MED ORDER — AMLODIPINE BESYLATE 10 MG PO TABS: 10.0000 mg | ORAL_TABLET | Freq: Every day | ORAL | 1 refills | Status: DC

## 2022-03-31 MED ORDER — CARVEDILOL 25 MG PO TABS: 25.0000 mg | ORAL_TABLET | Freq: Two times a day (BID) | ORAL | 3 refills | Status: DC

## 2022-03-31 MED ORDER — FUROSEMIDE 20 MG PO TABS: 20.0000 mg | ORAL_TABLET | Freq: Two times a day (BID) | ORAL | 2 refills | Status: DC | PRN

## 2022-04-08 ENCOUNTER — Other Ambulatory Visit: Payer: Self-pay

## 2022-04-13 ENCOUNTER — Other Ambulatory Visit: Payer: Self-pay

## 2022-04-14 ENCOUNTER — Ambulatory Visit: Payer: Self-pay | Admitting: Critical Care Medicine

## 2022-04-28 ENCOUNTER — Other Ambulatory Visit: Payer: Self-pay

## 2022-04-29 ENCOUNTER — Other Ambulatory Visit (HOSPITAL_COMMUNITY): Payer: Self-pay

## 2022-04-29 ENCOUNTER — Other Ambulatory Visit: Payer: Self-pay

## 2022-05-06 ENCOUNTER — Encounter: Payer: Self-pay | Admitting: Physician Assistant

## 2022-05-06 ENCOUNTER — Ambulatory Visit: Payer: Self-pay | Attending: Critical Care Medicine | Admitting: Physician Assistant

## 2022-05-06 ENCOUNTER — Other Ambulatory Visit (HOSPITAL_COMMUNITY)
Admission: RE | Admit: 2022-05-06 | Discharge: 2022-05-06 | Disposition: A | Discharge status: Home / Self Care | Payer: Self-pay | Source: Ambulatory Visit | Attending: Critical Care Medicine | Admitting: Critical Care Medicine

## 2022-05-06 ENCOUNTER — Other Ambulatory Visit: Payer: Self-pay

## 2022-05-06 VITALS — BP 106/70 | HR 81 | Wt 175.4 lb

## 2022-05-06 DIAGNOSIS — R3915 Urgency of urination: Secondary | ICD-10-CM

## 2022-05-06 DIAGNOSIS — E890 Postprocedural hypothyroidism: Secondary | ICD-10-CM

## 2022-05-06 DIAGNOSIS — N898 Other specified noninflammatory disorders of vagina: Secondary | ICD-10-CM

## 2022-05-06 DIAGNOSIS — I110 Hypertensive heart disease with heart failure: Secondary | ICD-10-CM

## 2022-05-06 DIAGNOSIS — E119 Type 2 diabetes mellitus without complications: Secondary | ICD-10-CM

## 2022-05-06 DIAGNOSIS — Z794 Long term (current) use of insulin: Secondary | ICD-10-CM

## 2022-05-06 DIAGNOSIS — I5030 Unspecified diastolic (congestive) heart failure: Secondary | ICD-10-CM

## 2022-05-06 DIAGNOSIS — I10 Essential (primary) hypertension: Secondary | ICD-10-CM

## 2022-05-06 LAB — GLUCOSE, POCT (MANUAL RESULT ENTRY): POC Glucose: 295 mg/dl — AB (ref 70–99)

## 2022-05-06 MED ORDER — FUROSEMIDE 20 MG PO TABS
20.0000 mg | ORAL_TABLET | Freq: Two times a day (BID) | ORAL | 2 refills | Status: DC | PRN
  Filled 2022-05-06: qty 60, 30d supply, fill #0

## 2022-05-06 MED ORDER — FLUCONAZOLE 150 MG PO TABS
150.0000 mg | ORAL_TABLET | Freq: Once | ORAL | 0 refills | Status: AC
  Filled 2022-05-06: qty 1, 1d supply, fill #0

## 2022-05-06 MED ORDER — CARVEDILOL 25 MG PO TABS
25.0000 mg | ORAL_TABLET | Freq: Two times a day (BID) | ORAL | 3 refills | Status: DC
  Filled 2022-05-06: qty 180, 90d supply, fill #0

## 2022-05-06 MED ORDER — LOSARTAN POTASSIUM 25 MG PO TABS
25.0000 mg | ORAL_TABLET | Freq: Every day | ORAL | 2 refills | Status: DC
  Filled 2022-05-06: qty 30, 30d supply, fill #0
  Filled 2022-11-03: qty 90, 90d supply, fill #0

## 2022-05-06 MED ORDER — SYNTHROID 150 MCG PO TABS
150.0000 ug | ORAL_TABLET | Freq: Every day | ORAL | 3 refills | Status: DC
  Filled 2022-05-06: qty 30, 30d supply, fill #0

## 2022-05-06 NOTE — Progress Notes (Signed)
Patient ID: Suzanne Cline, female   DOB: 1965-10-15, 57 y.o.   MRN: [id number]   Suzanne Cline, is a 57 y.o. female  SEG:[id number]  VPX:[id number]  DOB - 04/27/1966  Chief Complaint  Patient presents with   Medication Refill       Subjective:   Suzanne Cline is a 57 y.o. female here today for diabetes check.  Last A1C=10.8.  patient with homelessness since September.  Compliant with meds but only been back on farxiga for about 4 weeks.  No longer taking spironolactone.  She is having urinary urgency for several months now.  No dysuria.  Recently developed vaginal itching.  Currently living in her vehicle.    No problems updated.  ALLERGIES: Allergies  Allergen Reactions   Statins Other (See Comments)    Myopathy   Chlorhexidine     Itching,redness   Lisinopril Cough   Oxycodone Itching    PAST MEDICAL HISTORY: Past Medical History:  Diagnosis Date   CHF (congestive heart failure) ([facility])    Diabetes mellitus without complication ([place])    Difficult intubation    Family history of adverse reaction to anesthesia    Fibroids, intramural    s/p uterine artery embolization   Former cigarette smoker 04/2011   History of Resolved Nonischemic Congestive Cardiomyopathy ([place]) 04/2011   Echo March 2013: EF 35% with mild/moderate diastolic dysfunction -> Follow-Up Echo March 2014 EF 55% with G1 DD.   Hypertension    Hypertensive heart disease    Difficult control hypertension   Hypothyroidism    Mild concentric left ventricular hypertrophy (LVH) 10/08/2019   Moderate mitral valve regurgitation 10/08/2019   Echo done June 2021; Dr. Ellyn Hack   Neuromuscular disorder [facility])    diabetic neuropathy   OSA (obstructive sleep apnea)    Sleep apnea    doesn't use cpap regularly   Thyroid disease    post thyroidectomy    MEDICATIONS AT HOME: Prior to Admission medications   Medication Sig Start Date End Date Taking? Authorizing Provider  fluconazole (DIFLUCAN) 150 MG tablet  Take 1 tablet (150 mg total) by mouth once for 1 dose. 05/06/22 05/06/22 Yes Dixie Coppa, Dionne Bucy, PA-C  acyclovir (ZOVIRAX) 400 MG tablet Take 1 tablet (400 mg total) by mouth 5 (five) times daily. 12/10/21   [name], MD  amLODipine (NORVASC) 10 MG tablet Take 1 tablet (10 mg total) by mouth daily. 03/31/22   [name], MD  carvedilol (COREG) 25 MG tablet Take 1 tablet (25 mg total) by mouth 2 (two) times daily with a meal. 05/06/22   Yosef Krogh, Dionne Bucy, PA-C  dapagliflozin propanediol (FARXIGA) 10 MG TABS tablet Take 1 tablet (10 mg total) by mouth daily before breakfast. 02/16/22   [name], MD  Dulaglutide (TRULICITY) 1.5 IO/2.7OJ SOPN Inject 1.5 mg into the skin once a week. 02/27/22   [name], MD  furosemide (LASIX) 20 MG tablet Take 1 tablet (20 mg total) by mouth 2 (two) times daily as needed. 05/06/22   Argentina Donovan, PA-C  losartan (COZAAR) 25 MG tablet Take 1 tablet (25 mg total) by mouth daily. 05/06/22   Argentina Donovan, PA-C  SYNTHROID 150 MCG tablet Take 1 tablet (150 mcg total) by mouth daily. 05/06/22   Argentina Donovan, PA-C  TRUEplus Lancets 28G MISC Use as directed 12/10/21   [name], MD  Vitamin D, Ergocalciferol, (DRISDOL) 1.25 MG (50000 UNIT) CAPS capsule Take 1 capsule (50,000 Units total) by mouth  every 7 (seven) days. 12/10/21   [name], MD    ROS: Neg HEENT Neg resp Neg cardiac Neg GI Neg MS Neg psych Neg neuro  Objective:   Vitals:   05/06/22 1604  BP: 106/70  Pulse: 81  SpO2: 96%  Weight: 175 lb 6.4 oz (79.6 kg)   Exam General appearance : Awake, alert, not in any distress. Speech delayed/deliberate but Clear. Not toxic looking HEENT: Atraumatic and Normocephalic Neck: Supple, no JVD. No cervical lymphadenopathy.  Chest: Good air entry bilaterally, CTAB.  No rales/rhonchi/wheezing CVS: S1 S2 regular, no murmurs.  Extremities: B/L Lower Ext shows no edema, both legs are warm to touch Neurology: Awake  alert, and oriented X 3, CN II-XII intact, Non focal Skin: No Rash  Data Review Lab Results  Component Value Date   HGBA1C 10.8 (A) 12/10/2021   HGBA1C 8.1 (A) 01/21/2021   HGBA1C 11.0 (A) 12/18/2020    Assessment & Plan   1. Type 2 diabetes mellitus without complication, with long-term current use of insulin ([place]) Continue farxiga and trulicity.  Send rf as needed - Glucose (CBG) - Hemoglobin A1c - Comprehensive metabolic panel  2. Urgency of urination Likely contributing is uncontrolled diabetes - POCT URINALYSIS DIP (CLINITEK)-dip showed leukocytes and was otherwise neg - Cervicovaginal ancillary only  3. Essential hypertension controlled - losartan (COZAAR) 25 MG tablet; Take 1 tablet (25 mg total) by mouth daily.  Dispense: 90 tablet; Refill: 2 - carvedilol (COREG) 25 MG tablet; Take 1 tablet (25 mg total) by mouth 2 (two) times daily with a meal.  Dispense: 180 tablet; Refill: 3  4. Postoperative hypothyroidism - Thyroid Panel With TSH - SYNTHROID 150 MCG tablet; Take 1 tablet (150 mcg total) by mouth daily.  Dispense: 90 tablet; Refill: 3  5. Vaginal itching Diflucan sent-likely yeast due to uncontrolled diabetes - POCT URINALYSIS DIP (CLINITEK) - Cervicovaginal ancillary only  6. Hypertensive heart disease with diastolic heart failure ([facility]) - Comprehensive metabolic panel - carvedilol (COREG) 25 MG tablet; Take 1 tablet (25 mg total) by mouth 2 (two) times daily with a meal.  Dispense: 180 tablet; Refill: 3 - furosemide (LASIX) 20 MG tablet; Take 1 tablet (20 mg total) by mouth 2 (two) times daily as needed.  Dispense: 60 tablet; Refill: 2    Return in about 3 weeks (around 05/27/2022) for [facility]uke in 3 weeks for DM and Dr Joya Gaskins in 3 months.  The patient was given clear instructions to go to ER or return to medical center if symptoms don't improve, worsen or new problems develop. The patient verbalized understanding. The patient was told to call to get lab results  if they haven't heard anything in the next week.      Freeman Caldron, PA-C [facility] and [place], [place]   05/06/2022, 4:46 PM

## 2022-05-07 ENCOUNTER — Telehealth: Payer: Self-pay

## 2022-05-07 ENCOUNTER — Other Ambulatory Visit: Payer: Self-pay

## 2022-05-07 ENCOUNTER — Other Ambulatory Visit: Payer: Self-pay | Admitting: Physician Assistant

## 2022-05-07 DIAGNOSIS — E119 Type 2 diabetes mellitus without complications: Secondary | ICD-10-CM

## 2022-05-07 LAB — COMPREHENSIVE METABOLIC PANEL
ALT: 9 IU/L (ref 0–32)
AST: 14 IU/L (ref 0–40)
Albumin/Globulin Ratio: 1.5 (ref 1.2–2.2)
Albumin: 4.1 g/dL (ref 3.8–4.9)
Alkaline Phosphatase: 98 IU/L (ref 44–121)
BUN/Creatinine Ratio: 18 (ref 9–23)
BUN: 23 mg/dL (ref 6–24)
Bilirubin Total: <0.2 mg/dL (ref 0.0–1.2)
CO2: 24 mmol/L (ref 20–29)
Calcium: 9 mg/dL (ref 8.7–10.2)
Chloride: 102 mmol/L (ref 96–106)
Creatinine, Ser: 1.31 mg/dL — ABNORMAL HIGH (ref 0.57–1.00)
Globulin, Total: 2.7 g/dL (ref 1.5–4.5)
Glucose: 303 mg/dL — ABNORMAL HIGH (ref 70–99)
Potassium: 4.1 mmol/L (ref 3.5–5.2)
Sodium: 141 mmol/L (ref 134–144)
Total Protein: 6.8 g/dL (ref 6.0–8.5)
eGFR: 48 mL/min/{1.73_m2} — ABNORMAL LOW (ref >59)

## 2022-05-07 LAB — THYROID PANEL WITH TSH
Free Thyroxine Index: 2.1 (ref 1.2–4.9)
T3 Uptake Ratio: 30 % (ref 24–39)
T4, Total: 7.1 ug/dL (ref 4.5–12.0)
TSH: 1.35 u[IU]/mL (ref 0.450–4.500)

## 2022-05-07 LAB — HEMOGLOBIN A1C
Est. average glucose Bld gHb Est-mCnc: 177 mg/dL
Hgb A1c MFr Bld: 7.8 % — ABNORMAL HIGH (ref 4.8–5.6)

## 2022-05-07 MED ORDER — GLIPIZIDE 5 MG PO TABS
5.0000 mg | ORAL_TABLET | Freq: Two times a day (BID) | ORAL | 3 refills | Status: DC
  Filled 2022-05-07: qty 60, 30d supply, fill #0

## 2022-05-07 NOTE — Telephone Encounter (Signed)
Message received from [facility], [place] stating patient is inquiring about a CPAP machine.  I called patient  [phone]  to obtain more information about this request, message left with call back requested.

## 2022-05-08 LAB — CERVICOVAGINAL ANCILLARY ONLY
Bacterial Vaginitis (gardnerella): NEGATIVE
Candida Glabrata: NEGATIVE
Candida Vaginitis: POSITIVE — AB
Chlamydia: NEGATIVE
Comment: NEGATIVE
Comment: NEGATIVE
Comment: NEGATIVE
Comment: NEGATIVE
Comment: NEGATIVE
Comment: NORMAL
Neisseria Gonorrhea: NEGATIVE
Trichomonas: NEGATIVE

## 2022-05-13 ENCOUNTER — Other Ambulatory Visit: Payer: Self-pay | Admitting: Physician Assistant

## 2022-05-13 ENCOUNTER — Other Ambulatory Visit: Payer: Self-pay

## 2022-05-13 ENCOUNTER — Other Ambulatory Visit (HOSPITAL_COMMUNITY): Payer: Self-pay

## 2022-05-13 MED ORDER — FLUCONAZOLE 150 MG PO TABS
150.0000 mg | ORAL_TABLET | Freq: Once | ORAL | 0 refills | Status: AC
  Filled 2022-05-13: qty 1, 1d supply, fill #0

## 2022-05-14 ENCOUNTER — Ambulatory Visit: Payer: Self-pay | Admitting: Physician Assistant

## 2022-05-15 ENCOUNTER — Telehealth: Payer: Self-pay

## 2022-05-15 ENCOUNTER — Other Ambulatory Visit: Payer: Self-pay

## 2022-05-15 NOTE — Telephone Encounter (Signed)
Pt was called and vm was left, Information has been sent to nurse pool.   

## 2022-05-15 NOTE — Telephone Encounter (Signed)
-----  Message from Carilyn Goodpasture, RN sent at 05/14/2022  5:41 PM EST -----  ----- Message ----- From: Mathis Dad Sent: 05/13/2022   8:50 AM EST To: Carilyn Goodpasture, RN  Diflucan has been sent to the pharmacy for vaginal yeast infection.  Thanks, Freeman Caldron, PA-C

## 2022-05-15 NOTE — Telephone Encounter (Signed)
-----  Message from Carilyn Goodpasture, RN sent at 05/14/2022  5:34 PM EST -----  ----- Message ----- From: Argentina Donovan, PA-C Sent: 05/12/2022  11:08 AM EST To: Carilyn Goodpasture, RN  Please call patient and let her know thyroid is normal. Diabetes uncontrolled as we discussed. Kidney function is impaired. Better diabetes control and drinking ample water would help. She had a yeast infection causing vaginal itching. I'll send a diflucan when I am back in the office tomorrow. There was no STD on the vaginal swabs.  Thanks, Freeman Caldron, PA-C

## 2022-05-27 ENCOUNTER — Other Ambulatory Visit: Payer: Self-pay

## 2022-05-27 ENCOUNTER — Other Ambulatory Visit: Payer: Self-pay | Admitting: Critical Care Medicine

## 2022-05-27 MED ORDER — TRULICITY 1.5 MG/0.5ML ~~LOC~~ SOAJ
1.5000 mg | SUBCUTANEOUS | 2 refills | Status: DC
  Filled 2022-05-27 – 2022-06-15 (×3): qty 2, 28d supply, fill #0

## 2022-06-02 NOTE — Telephone Encounter (Signed)
I tried to reach the patient [phone] again to inquire about her questions regarding a CPAP machine.  Message left with call back requested.

## 2022-06-04 ENCOUNTER — Ambulatory Visit: Payer: Self-pay | Admitting: Pharmacist

## 2022-06-09 ENCOUNTER — Other Ambulatory Visit: Payer: Self-pay

## 2022-06-15 ENCOUNTER — Other Ambulatory Visit: Payer: Self-pay

## 2022-06-15 ENCOUNTER — Other Ambulatory Visit (HOSPITAL_COMMUNITY): Payer: Self-pay

## 2022-06-18 ENCOUNTER — Telehealth: Payer: Self-pay

## 2022-06-18 DIAGNOSIS — G4733 Obstructive sleep apnea (adult) (pediatric): Secondary | ICD-10-CM

## 2022-06-18 NOTE — Telephone Encounter (Signed)
I would wait until she has stable housing. Not ideal situation for cpap living in car

## 2022-06-18 NOTE — Progress Notes (Signed)
She currently stays in her car and occasionally will stay with friends.  I provided her with information about the [facility] and case workers who can assist with the housing search.

## 2022-06-18 NOTE — Telephone Encounter (Signed)
I was able to reach the patient to discuss her need for a new CPAP machine.  She is uninsured and is currently homeless and stays in her car.  I asked about being able to use the CPAP machine and she said she has access to a power station and sometimes stays at a friend's house.   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    She was not familiar with the [facility] and I explained that they have case workers that can assist with searching for housing.  I gave her the address and she said she could also look it up on line.   She does not have money for a new CPAP machine and I  informed her about the American Sleep Apnea Association (ASAA) CPAP Assistance Program - They provide refurbished machines to individuals for a $100 program fee. There is no warranty or technical support that comes with the machine, it is provided " as is."    The patient said that she is not able to afford the program fee at this time. Explained to her that [facility] has funding available to assist with this. I told her that I will check to make sure that Dr Joya Gaskins is in agreement to ordering the CPAP machine through the CPAP assistance program.    I explained that we have  the machine delivered to [facility].  When   she picks up the machine, she will be asked to sign a waiver of liability for the [employer] . At the time of pick she will be scheduled to meet with a respiratory therapy technician who will review the use, care and cleaning of the machine.

## 2022-06-22 NOTE — Telephone Encounter (Signed)
I called patient to inform her that Dr Joya Gaskins recommends that we wait until she has stable housing before we order the CPAP machine.  Message left with call back requested.

## 2022-06-25 ENCOUNTER — Ambulatory Visit: Payer: Self-pay | Admitting: Pharmacist

## 2022-07-01 NOTE — Telephone Encounter (Signed)
Suzanne Cline I do not see any sleep study ever done, she will need a new sleep study and she is uninsured

## 2022-07-03 NOTE — Addendum Note (Signed)
Addended by: [name] on: 07/03/2022 06:47 AM   Modules accepted: Orders

## 2022-07-03 NOTE — Telephone Encounter (Signed)
I ordered sleep study

## 2022-07-06 NOTE — Telephone Encounter (Signed)
Call placed to patient [phone] to inform her that the order for the sleep study was placed.  Message left with call back requested

## 2022-07-08 ENCOUNTER — Encounter: Payer: Self-pay | Admitting: Critical Care Medicine

## 2022-07-08 DIAGNOSIS — E119 Type 2 diabetes mellitus without complications: Secondary | ICD-10-CM

## 2022-07-09 ENCOUNTER — Other Ambulatory Visit: Payer: Self-pay

## 2022-07-09 MED ORDER — TRULICITY 1.5 MG/0.5ML ~~LOC~~ SOAJ
1.5000 mg | SUBCUTANEOUS | 2 refills | Status: DC
  Filled 2022-07-09: qty 2, 28d supply, fill #0
  Filled 2022-08-12: qty 2, 28d supply, fill #1
  Filled 2022-09-19 – 2022-09-30 (×3): qty 2, 28d supply, fill #2

## 2022-07-09 MED ORDER — DAPAGLIFLOZIN PROPANEDIOL 10 MG PO TABS
10.0000 mg | ORAL_TABLET | Freq: Every day | ORAL | 6 refills | Status: DC
  Filled 2022-07-09: qty 30, 30d supply, fill #0
  Filled 2022-07-13: qty 120, 120d supply, fill #0

## 2022-07-09 MED ORDER — GLIPIZIDE 5 MG PO TABS
5.0000 mg | ORAL_TABLET | Freq: Two times a day (BID) | ORAL | 3 refills | Status: DC
  Filled 2022-07-09: qty 60, 30d supply, fill #0

## 2022-07-13 ENCOUNTER — Other Ambulatory Visit: Payer: Self-pay

## 2022-07-14 ENCOUNTER — Other Ambulatory Visit: Payer: Self-pay

## 2022-08-04 ENCOUNTER — Ambulatory Visit: Payer: Self-pay | Admitting: Critical Care Medicine

## 2022-08-07 ENCOUNTER — Encounter (HOSPITAL_BASED_OUTPATIENT_CLINIC_OR_DEPARTMENT_OTHER): Payer: Self-pay | Admitting: Internal Medicine

## 2022-08-12 ENCOUNTER — Encounter: Payer: Self-pay | Admitting: Critical Care Medicine

## 2022-08-12 ENCOUNTER — Other Ambulatory Visit: Payer: Self-pay

## 2022-08-12 DIAGNOSIS — E890 Postprocedural hypothyroidism: Secondary | ICD-10-CM

## 2022-08-12 MED ORDER — SYNTHROID 150 MCG PO TABS: 150.0000 ug | ORAL_TABLET | Freq: Every day | ORAL | 3 refills | Status: DC

## 2022-08-19 ENCOUNTER — Telehealth: Payer: Self-pay

## 2022-08-19 NOTE — Telephone Encounter (Signed)
Copied from CRM [id number]. Topic: General - Inquiry >> Aug 19, 2022  3:00 PM [place] wrote: Reason for [id number] Nurse Erskine Squibb gave her the name of a group that can assist her with searching for housing. Just needs the name of the agency and contact details. I advised pt per notes from Erskine Squibb, that she is possibly referring to [facility].  Pt requested a callback and asked if a MyChart message could be sent to her with information if nurse Erskine Squibb couldn't reach her.  Please advise.

## 2022-08-20 NOTE — Telephone Encounter (Signed)
Call returned to patient, message left with call back requested.  I am also sending her a MyChart message with contact information for the [facility]

## 2022-08-21 ENCOUNTER — Ambulatory Visit (HOSPITAL_BASED_OUTPATIENT_CLINIC_OR_DEPARTMENT_OTHER): Payer: Self-pay | Admitting: Internal Medicine

## 2022-08-21 DIAGNOSIS — I5030 Unspecified diastolic (congestive) heart failure: Secondary | ICD-10-CM

## 2022-09-14 ENCOUNTER — Encounter (HOSPITAL_BASED_OUTPATIENT_CLINIC_OR_DEPARTMENT_OTHER): Payer: Self-pay | Admitting: Internal Medicine

## 2022-09-14 NOTE — Progress Notes (Signed)
Amemdment made

## 2022-09-14 NOTE — Progress Notes (Signed)
    See recent sleep study

## 2022-09-19 ENCOUNTER — Other Ambulatory Visit: Payer: Self-pay

## 2022-09-19 ENCOUNTER — Other Ambulatory Visit (HOSPITAL_COMMUNITY): Payer: Self-pay

## 2022-09-22 ENCOUNTER — Other Ambulatory Visit: Payer: Self-pay

## 2022-09-29 ENCOUNTER — Other Ambulatory Visit: Payer: Self-pay

## 2022-09-30 ENCOUNTER — Other Ambulatory Visit: Payer: Self-pay

## 2022-10-06 ENCOUNTER — Other Ambulatory Visit (HOSPITAL_COMMUNITY): Payer: Self-pay

## 2022-10-06 ENCOUNTER — Other Ambulatory Visit: Payer: Self-pay

## 2022-10-09 ENCOUNTER — Ambulatory Visit (HOSPITAL_BASED_OUTPATIENT_CLINIC_OR_DEPARTMENT_OTHER): Payer: Self-pay | Admitting: Internal Medicine

## 2022-10-09 DIAGNOSIS — I10 Essential (primary) hypertension: Secondary | ICD-10-CM

## 2022-11-02 ENCOUNTER — Other Ambulatory Visit: Payer: Self-pay

## 2022-11-02 NOTE — Progress Notes (Deleted)
Erroneus

## 2022-11-02 NOTE — Progress Notes (Signed)
Sleep study pending  

## 2022-11-03 ENCOUNTER — Other Ambulatory Visit: Payer: Self-pay

## 2022-11-03 ENCOUNTER — Other Ambulatory Visit: Payer: Self-pay | Admitting: Critical Care Medicine

## 2022-11-03 MED ORDER — TRULICITY 1.5 MG/0.5ML ~~LOC~~ SOAJ
1.5000 mg | SUBCUTANEOUS | 0 refills | Status: DC
  Filled 2022-11-03: qty 2, 28d supply, fill #0

## 2022-11-04 ENCOUNTER — Other Ambulatory Visit: Payer: Self-pay

## 2022-11-05 ENCOUNTER — Ambulatory Visit (HOSPITAL_BASED_OUTPATIENT_CLINIC_OR_DEPARTMENT_OTHER): Payer: Self-pay | Admitting: Internal Medicine

## 2022-11-05 ENCOUNTER — Other Ambulatory Visit: Payer: Self-pay

## 2022-11-05 ENCOUNTER — Encounter: Payer: Self-pay | Admitting: Critical Care Medicine

## 2022-11-05 ENCOUNTER — Telehealth: Payer: Self-pay

## 2022-11-05 ENCOUNTER — Ambulatory Visit: Payer: Self-pay | Attending: Critical Care Medicine | Admitting: Critical Care Medicine

## 2022-11-05 VITALS — BP 109/73 | HR 71 | Ht 63.0 in | Wt 172.8 lb

## 2022-11-05 DIAGNOSIS — E890 Postprocedural hypothyroidism: Secondary | ICD-10-CM

## 2022-11-05 DIAGNOSIS — Z87891 Personal history of nicotine dependence: Secondary | ICD-10-CM

## 2022-11-05 DIAGNOSIS — I10 Essential (primary) hypertension: Secondary | ICD-10-CM

## 2022-11-05 DIAGNOSIS — Z5902 Unsheltered homelessness: Secondary | ICD-10-CM

## 2022-11-05 DIAGNOSIS — I110 Hypertensive heart disease with heart failure: Secondary | ICD-10-CM

## 2022-11-05 DIAGNOSIS — Z90710 Acquired absence of both cervix and uterus: Secondary | ICD-10-CM

## 2022-11-05 DIAGNOSIS — E0789 Other specified disorders of thyroid: Secondary | ICD-10-CM

## 2022-11-05 DIAGNOSIS — R911 Solitary pulmonary nodule: Secondary | ICD-10-CM

## 2022-11-05 DIAGNOSIS — G720 Drug-induced myopathy: Secondary | ICD-10-CM

## 2022-11-05 DIAGNOSIS — Z794 Long term (current) use of insulin: Secondary | ICD-10-CM

## 2022-11-05 DIAGNOSIS — K200 Eosinophilic esophagitis: Secondary | ICD-10-CM

## 2022-11-05 DIAGNOSIS — E1169 Type 2 diabetes mellitus with other specified complication: Secondary | ICD-10-CM

## 2022-11-05 LAB — POCT GLYCOSYLATED HEMOGLOBIN (HGB A1C): Hemoglobin A1C: 7.9 % — AB (ref 4.0–5.6)

## 2022-11-05 MED ORDER — ACYCLOVIR 400 MG PO TABS
400.0000 mg | ORAL_TABLET | Freq: Every day | ORAL | 1 refills | Status: DC
  Filled 2022-11-05: qty 60, 12d supply, fill #0

## 2022-11-05 MED ORDER — SYNTHROID 150 MCG PO TABS
150.0000 ug | ORAL_TABLET | Freq: Every day | ORAL | 3 refills | Status: DC
  Filled 2022-11-05: qty 90, 90d supply, fill #0

## 2022-11-05 MED ORDER — TRULICITY 1.5 MG/0.5ML ~~LOC~~ SOAJ
1.5000 mg | SUBCUTANEOUS | 3 refills | Status: DC
  Filled 2022-12-02 (×2): qty 2, 28d supply, fill #0

## 2022-11-05 MED ORDER — GLIPIZIDE 5 MG PO TABS
5.0000 mg | ORAL_TABLET | Freq: Two times a day (BID) | ORAL | 3 refills | Status: DC
  Filled 2022-11-05: qty 60, 30d supply, fill #0

## 2022-11-05 MED ORDER — AMLODIPINE BESYLATE 10 MG PO TABS
10.0000 mg | ORAL_TABLET | Freq: Every day | ORAL | 1 refills | Status: DC
  Filled 2022-11-05: qty 90, 90d supply, fill #0

## 2022-11-05 MED ORDER — DAPAGLIFLOZIN PROPANEDIOL 10 MG PO TABS
10.0000 mg | ORAL_TABLET | Freq: Every day | ORAL | 6 refills | Status: DC
  Filled 2022-11-05: qty 30, 30d supply, fill #0

## 2022-11-05 MED ORDER — LOSARTAN POTASSIUM 25 MG PO TABS: 25.0000 mg | ORAL_TABLET | Freq: Every day | ORAL | 2 refills | Status: DC

## 2022-11-05 MED ORDER — VITAMIN D (ERGOCALCIFEROL) 1.25 MG (50000 UNIT) PO CAPS: 50000.0000 [IU] | ORAL_CAPSULE | ORAL | 4 refills | Status: DC

## 2022-11-05 MED ORDER — FUROSEMIDE 20 MG PO TABS
20.0000 mg | ORAL_TABLET | Freq: Two times a day (BID) | ORAL | 2 refills | Status: DC | PRN
  Filled 2022-11-05: qty 60, 30d supply, fill #0

## 2022-11-05 NOTE — Assessment & Plan Note (Signed)
Will monitor

## 2022-11-05 NOTE — Assessment & Plan Note (Signed)
A1c still elevated difficult access healthy food plan here is to maintain current medications and follow-up short-term obtain labs

## 2022-11-05 NOTE — Assessment & Plan Note (Signed)
Blood pressure currently at goal I think we have room to eliminate amlodipine which is likely contributing to edema  Will check labs maintain losartan and carvedilol for now

## 2022-11-05 NOTE — Progress Notes (Signed)
Established Patient Office Visit  Subjective:  Patient ID: Suzanne Cline, female    DOB: [birth year]  Age: 57 y.o. MRN: [id number]  Chief Complaint  Patient presents with   Diabetes   Hypertension     HPI 03/2021 Michele Mcalpine Morency presents for post hospital follow-up.  The patient recently in November underwent total hysterectomy and she states since that time there is been some improvement in her pelvic pain but she continues to have heartburn symptoms fullness in the upper abdominal area with upper abdominal discomfort.  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  She still has short-term disability that is set to run out this week.  She is can apply for unemployment.  Her last day of work was November 14, 2020.  We she did have FMLA but this ran out in October.  She states her blood sugars at home run 95-100.  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  She was recommended to be on Carafate and proton pump inhibitors she is off these at this time.  She notes increased urinary frequency pain in the upper gastrointestinal tract and swelling in the abdomen.  Her abdominal process from the lower abdomen is improving after the hysterectomy for severe uterine fibroids.   12/10/21 Patient is seen in return follow-up not been seen since December 2022.  On arrival blood pressure is 119/77 and blood sugar is elevated 302.  She has been off all of her diabetic medications for the past month as she ran out of medications and cannot obtain refills.  Note patient is uninsured will need patient assistance for her medications.  Another issue is she stopped taking the Lantus because she was gaining weight with it and did not like this sensation.  The patient has adopted a lifestyle management process and she is eating a plant-based diet at this time and trying to exercise more.  Since the last visit she did have a total  hysterectomy and salpingo for ectomy she still has her ovaries.  This was for uterine fibroids.  The abdominal pain did not improve significantly with this procedure as was anticipated.  The patient also was seen by gastroenterology who ultimately referred her to sports medicine for her arthritis type symptoms.  She was found to have a polyarthralgia syndrome with no specific type of osteoarthritis and sports medicine.  She has an elevated sed rate.  The work-up also showed a new thyroid nodule at the base of her previous partial thyroid resection.  She has been referred to the thyroid surgeon for further evaluation of same.  Patient is interested in resuming some type of diabetic medication at this time.  She would like to avoid further insulin products.  On arrival her A1c is elevated at 10.  Patient's blood pressure program does consist ofCarvedilol spironolactone and amlodipine.  The patient has stopped taking Diovan Note patient has statin induced myopathy cannot take statins also cannot tolerate metformin even XR formant  11/05/22 This patient is seen in return follow-up having not been seen since August 2023.  Unfortunately the patient became homeless in September 2023.  She lost her job and rent increased in her home she had to leave her home become homeless.  She now lives in her car which is a Scientist, clinical (histocompatibility and immunogenetics).  The patient has not been able to access her medications at this time.  She just obtained some refills recently.  She has been traveling locally and across the state and does have a storage facility for her vitamins.  She is trying to work with [facility] to obtain Medicaid as she is currently now uninsured.  She is taking a part-time job at [employer] in her car.  It is difficult to obtain gas for her car but she is managing with the part-time job she currently has.  She makes less than $20,000 a year.  Patient's not been able to monitor her blood sugar.  On arrival A1c is 7.9.  The  patient maintains the carvedilol 25 mg twice daily amlodipine 10 mg daily and losartan 25 mg daily with this she is having significant lower extremity edema.  Patient is on the Trulicity 1.5 mg weekly and Farxiga 10 mg daily along with the glipizide daily  Patient needs follow-up labs.  Past Medical History:  Diagnosis Date   CHF (congestive heart failure) (HCC)    Chronic constipation 06/29/2019   Diabetes mellitus without complication (HCC)    Difficult intubation    Esophageal eosinophilia 08/25/2019   Family history of adverse reaction to anesthesia    Fibroids, intramural    s/p uterine artery embolization   Former cigarette smoker 04/2011   History of Resolved Nonischemic Congestive Cardiomyopathy (HCC) 04/2011   Echo March 2013: EF 35% with mild/moderate diastolic dysfunction -> Follow-Up Echo March 2014 EF 55% with G1 DD.   Hypertension    Hypertensive heart disease    Difficult control hypertension   Hypothyroidism    Mild concentric left ventricular hypertrophy (LVH) 10/08/2019   Moderate mitral valve regurgitation 10/08/2019   Echo done June 2021; Dr. Herbie Baltimore   Neuromuscular disorder [facility])    diabetic neuropathy   OSA (obstructive sleep apnea)    Sleep apnea    doesn't use cpap regularly   Thyroid disease    post thyroidectomy   Thyroid mass of unclear etiology 11/27/2021   Status post thyroidectomy.      Past Surgical History:  Procedure Laterality Date   ABLATION     uterus   COLONOSCOPY     2017 -  normal: in [place], [place]    DIAGNOSTIC LAPAROSCOPY     for fibroids   DILATION AND CURETTAGE OF UTERUS     HYSTERECTOMY ABDOMINAL WITH SALPINGECTOMY Bilateral 03/04/2021   Procedure: HYSTERECTOMY ABDOMINAL WITH BILATERAL SALPINGECTOMY;  Surgeon: Hermina Staggers, MD;  Location: [facility] OR;  Service: Gynecology;  Laterality: Bilateral;  TAP BLOCK   LEFT HEART CATH AND CORONARY ANGIOGRAPHY  05/19/2011   ([facility]): Normal coronary arteries   THYROIDECTOMY      TRANSTHORACIC ECHOCARDIOGRAM  07/18/2011   [facility] [place]: EF 35% with mild to moderate DD. -->   TRANSTHORACIC ECHOCARDIOGRAM     [facility] [place]: EF 55%, G1 DD.  LVIDd 4.3; July 12, 2015: EF 55 to 60%.  Normal valves   TUBAL LIGATION     UPPER GASTROINTESTINAL ENDOSCOPY  07/06/2019    Family History  Problem Relation Age of Onset   Congestive Heart Failure Mother    Breast cancer Neg Hx    Colon cancer Neg Hx    Esophageal cancer Neg Hx    Rectal cancer Neg Hx    Stomach cancer Neg Hx    Inflammatory bowel disease Neg Hx    Liver disease Neg Hx    Pancreatic cancer Neg Hx     Social History   Socioeconomic History   Marital status: Single  Spouse name: Not on file   Number of children: 2   Years of education: Not on file   Highest education level: Not on file  Occupational History   Not on file  Tobacco Use   Smoking status: Former    Types: Cigars    Quit date: 03/2019    Years since quitting: 3.6   Smokeless tobacco: Never   Tobacco comments:    smokes occasional cigar  Vaping Use   Vaping status: Every Day   Devices: hemp  Substance and Sexual Activity   Alcohol use: Yes    Comment: social   Drug use: Yes    Types: Marijuana   Sexual activity: Not Currently  Other Topics Concern   Not on file  Social History Narrative   Not on file   Social Determinants of Health   Financial Resource Strain: Not on file  Food Insecurity: No Food Insecurity (03/31/2021)   Hunger Vital Sign    Worried About Running Out of Food in the Last Year: Never true    Ran Out of Food in the Last Year: Never true  Transportation Needs: No Transportation Needs (03/31/2021)   PRAPARE - Administrator, Civil Service (Medical): No    Lack of Transportation (Non-Medical): No  Physical Activity: Not on file  Stress: Not on file  Social Connections: Not on file  Intimate Partner Violence: Not on file    Outpatient Medications Prior to  Visit  Medication Sig Dispense Refill   carvedilol (COREG) 25 MG tablet Take 1 tablet (25 mg total) by mouth 2 (two) times daily with a meal. 180 tablet 3   TRUEplus Lancets 28G MISC Use as directed 100 each 4   acyclovir (ZOVIRAX) 400 MG tablet Take 1 tablet (400 mg total) by mouth 5 (five) times daily. 60 tablet 1   amLODipine (NORVASC) 10 MG tablet Take 1 tablet (10 mg total) by mouth daily. 90 tablet 1   dapagliflozin propanediol (FARXIGA) 10 MG TABS tablet Take 1 tablet (10 mg total) by mouth daily before breakfast. 30 tablet 6   Dulaglutide (TRULICITY) 1.5 MG/0.5ML SOPN Inject 1.5 mg into the skin once a week. 2 mL 0   furosemide (LASIX) 20 MG tablet Take 1 tablet (20 mg total) by mouth 2 (two) times daily as needed. 60 tablet 2   glipiZIDE (GLUCOTROL) 5 MG tablet Take 1 tablet (5 mg total) by mouth 2 (two) times daily before a meal. 60 tablet 3   losartan (COZAAR) 25 MG tablet Take 1 tablet (25 mg total) by mouth daily. 90 tablet 2   SYNTHROID 150 MCG tablet Take 1 tablet (150 mcg total) by mouth daily. 90 tablet 3   Vitamin D, Ergocalciferol, (DRISDOL) 1.25 MG (50000 UNIT) CAPS capsule Take 1 capsule (50,000 Units total) by mouth every 7 (seven) days. 12 capsule 4   No facility-administered medications prior to visit.    Allergies  Allergen Reactions   Statins Other (See Comments)    Myopathy   Chlorhexidine     Itching,redness   Lisinopril Cough   Oxycodone Itching    ROS Review of Systems  Constitutional:  Negative for unexpected weight change.  HENT: Negative.  Negative for ear pain, postnasal drip, rhinorrhea, sinus pressure, sore throat, trouble swallowing and voice change.   Eyes: Negative.   Respiratory: Negative.  Negative for apnea, cough, choking, chest tightness, shortness of breath, wheezing and stridor.   Cardiovascular:  Positive for leg swelling. Negative for chest  pain and palpitations.  Gastrointestinal:  Negative for abdominal distention, abdominal pain,  anal bleeding, blood in stool, constipation, diarrhea, nausea and vomiting.       Reflux symptoms  Genitourinary: Negative.   Musculoskeletal:  Positive for myalgias. Negative for arthralgias.  Skin: Negative.  Negative for rash.  Allergic/Immunologic: Negative.  Negative for environmental allergies and food allergies.  Neurological: Negative.  Negative for dizziness, syncope, weakness and headaches.  Hematological: Negative.  Negative for adenopathy. Does not bruise/bleed easily.  Psychiatric/Behavioral:  Positive for decreased concentration, dysphoric mood and sleep disturbance. Negative for agitation, behavioral problems, confusion, hallucinations, self-injury and suicidal ideas. The patient is nervous/anxious. The patient is not hyperactive.       Objective:    Physical Exam Vitals reviewed.  Constitutional:      Appearance: Normal appearance. She is well-developed. She is obese. She is not diaphoretic.  HENT:     Head: Normocephalic and atraumatic.     Nose: No nasal deformity, septal deviation, mucosal edema or rhinorrhea.     Right Sinus: No maxillary sinus tenderness or frontal sinus tenderness.     Left Sinus: No maxillary sinus tenderness or frontal sinus tenderness.     Mouth/Throat:     Pharynx: No oropharyngeal exudate.  Eyes:     General: No scleral icterus.    Conjunctiva/sclera: Conjunctivae normal.     Pupils: Pupils are equal, round, and reactive to light.  Neck:     Thyroid: No thyromegaly.     Vascular: No carotid bruit or JVD.     Trachea: Trachea normal. No tracheal tenderness or tracheal deviation.  Cardiovascular:     Rate and Rhythm: Normal rate and regular rhythm.     Chest Wall: PMI is not displaced.     Pulses: Normal pulses. No decreased pulses.     Heart sounds: Normal heart sounds, S1 normal and S2 normal. Heart sounds not distant. No murmur heard.    No systolic murmur is present.     No diastolic murmur is present.     No friction rub. No  gallop. No S3 or S4 sounds.  Pulmonary:     Effort: No tachypnea, accessory muscle usage or respiratory distress.     Breath sounds: No stridor. No decreased breath sounds, wheezing, rhonchi or rales.  Chest:     Chest wall: No tenderness.  Abdominal:     General: Bowel sounds are normal. There is no distension.     Palpations: Abdomen is soft. Abdomen is not rigid.     Tenderness: There is no abdominal tenderness. There is no guarding or rebound.  Musculoskeletal:        General: No swelling. Normal range of motion.     Cervical back: Normal range of motion and neck supple. No edema, erythema or rigidity. No muscular tenderness. Normal range of motion.     Right lower leg: Edema present.     Left lower leg: Edema present.     Comments: Bilateral edema mid tibia and down to the feet  Lymphadenopathy:     Head:     Right side of head: No submental or submandibular adenopathy.     Left side of head: No submental or submandibular adenopathy.     Cervical: No cervical adenopathy.  Skin:    General: Skin is warm and dry.     Coloration: Skin is not pale.     Findings: No rash.     Nails: There is no clubbing.  Neurological:  Mental Status: She is alert and oriented to person, place, and time.     Sensory: No sensory deficit.  Psychiatric:        Attention and Perception: Attention normal.        Mood and Affect: Mood is depressed.        Speech: Speech normal.        Behavior: Behavior is withdrawn. Behavior is cooperative.        Thought Content: Thought content normal. Thought content does not include homicidal or suicidal ideation. Thought content does not include homicidal or suicidal plan.        Cognition and Memory: Cognition normal.        Judgment: Judgment normal.    No exam this is a phone visit BP 109/73 (BP Location: Left Arm, Patient Position: Sitting, Cuff Size: Normal)   Pulse 71   Ht 5\' 3"  (1.6 m)   Wt 172 lb 12.8 oz (78.4 kg)   SpO2 97%   BMI 30.61 kg/m   Wt Readings from Last 3 Encounters:  11/05/22 172 lb 12.8 oz (78.4 kg)  05/06/22 175 lb 6.4 oz (79.6 kg)  12/22/21 170 lb (77.1 kg)     Health Maintenance Due  Topic Date Due   MAMMOGRAM  03/29/2021   COLON CANCER SCREENING ANNUAL FOBT  05/02/2021   OPHTHALMOLOGY EXAM  10/08/2021   COVID-19 Vaccine (1 - 2023-24 season) Never done    There are no preventive care reminders to display for this patient.  Lab Results  Component Value Date   TSH 1.350 05/06/2022   Lab Results  Component Value Date   WBC 6.7 09/10/2021   HGB 12.1 09/10/2021   HCT 36.9 09/10/2021   MCV 84.2 09/10/2021   PLT 221.0 09/10/2021   Lab Results  Component Value Date   NA 141 05/06/2022   K 4.1 05/06/2022   CO2 24 05/06/2022   GLUCOSE 303 (H) 05/06/2022   BUN 23 05/06/2022   CREATININE 1.31 (H) 05/06/2022   BILITOT <0.2 05/06/2022   ALKPHOS 98 05/06/2022   AST 14 05/06/2022   ALT 9 05/06/2022   PROT 6.8 05/06/2022   ALBUMIN 4.1 05/06/2022   CALCIUM 9.0 05/06/2022   ANIONGAP 7 03/06/2021   EGFR 48 (L) 05/06/2022   GFR 46.73 (L) 12/22/2021   No results found for: "CHOL" No results found for: "HDL" No results found for: "LDLCALC" No results found for: "TRIG" No results found for: "CHOLHDL" Lab Results  Component Value Date   HGBA1C 7.9 (A) 11/05/2022      Assessment & Plan:   Problem List Items Addressed This Visit       Cardiovascular and Mediastinum   Hypertensive heart disease with diastolic heart failure (HCC) (Chronic)    Blood pressure currently at goal I think we have room to eliminate amlodipine which is likely contributing to edema  Will check labs maintain losartan and carvedilol for now      Relevant Medications   losartan (COZAAR) 25 MG tablet   furosemide (LASIX) 20 MG tablet   amLODipine (NORVASC) 10 MG tablet   Essential hypertension (Chronic)    As per above assessment hypertensive heart disease      Relevant Medications   losartan (COZAAR) 25 MG tablet    furosemide (LASIX) 20 MG tablet   amLODipine (NORVASC) 10 MG tablet   Other Relevant Orders   Comprehensive metabolic panel   CBC with Differential/Platelet     Respiratory   Lung nodule seen  on imaging study    Will monitor        Digestive   RESOLVED: Esophageal eosinophilia    Asymptomatic        Endocrine   Type 2 diabetes mellitus without complication, with long-term current use of insulin (HCC) - Primary (Chronic)    A1c still elevated difficult access healthy food plan here is to maintain current medications and follow-up short-term obtain labs      Relevant Medications   losartan (COZAAR) 25 MG tablet   glipiZIDE (GLUCOTROL) 5 MG tablet   Dulaglutide (TRULICITY) 1.5 MG/0.5ML SOPN   dapagliflozin propanediol (FARXIGA) 10 MG TABS tablet   Other Relevant Orders   POCT glycosylated hemoglobin (Hb A1C) (Completed)   Comprehensive metabolic panel   Hyperlipidemia associated with type 2 diabetes mellitus (HCC) (Chronic)    As per statin myopathy      Relevant Medications   losartan (COZAAR) 25 MG tablet   glipiZIDE (GLUCOTROL) 5 MG tablet   furosemide (LASIX) 20 MG tablet   Dulaglutide (TRULICITY) 1.5 MG/0.5ML SOPN   dapagliflozin propanediol (FARXIGA) 10 MG TABS tablet   amLODipine (NORVASC) 10 MG tablet   Other Relevant Orders   Lipid panel   Postoperative hypothyroidism    Check thyroid function continue Synthroid      Relevant Medications   SYNTHROID 150 MCG tablet   Other Relevant Orders   Thyroid Panel With TSH     Musculoskeletal and Integument   Statin myopathy    Cannot take statins will obtain lipids labs may need to consider Zetia or alternatives        Other   History of tobacco use    Remains off tobacco      History of abdominal hysterectomy    Does not need Pap smear has had a total hysterectomy      Unsheltered homelessness    Living in her car since September 2023 will connect to case management services      RESOLVED:  Thyroid mass of unclear etiology    Prior thyroid ultrasound negative will monitor     45 minutes spent extra time needed to assess multiple problems and S DOH issues including homelessness Referred to the Medicaid office to see if we can get her Medicaid  Return in about 2 months (around 01/06/2023) for htn, diabetes, primary care follow up.  Shan Levans   Mammogram was ordered fecal occult kit for colon cancer screening was issued

## 2022-11-05 NOTE — Assessment & Plan Note (Signed)
As per statin myopathy

## 2022-11-05 NOTE — Assessment & Plan Note (Signed)
Check thyroid function continue Synthroid

## 2022-11-05 NOTE — Assessment & Plan Note (Signed)
Remains off tobacco 

## 2022-11-05 NOTE — Assessment & Plan Note (Signed)
Does not need Pap smear has had a total hysterectomy

## 2022-11-05 NOTE — Assessment & Plan Note (Signed)
Asymptomatic. 

## 2022-11-05 NOTE — Telephone Encounter (Signed)
At the request of Dr Delford Field, I met with the patient when she was in the clinic today.  She is currently living in her car and is working part time for  [employer].  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  She explained how it is very difficult to obtain housing when she does not have a consistent income to pay for deposit and rent .  She has been to the [facility] and has been connected with the coordinated reentry program for assistance with housing. She does not have family /friends that she can stay with at this time.  Her address for mail has been updated in Epic. I provided her with the address/ phone number for the [facility] and encouraged her to call them to inquire about housing resources and community support options as she gets ready to start a new job .  She is currently uninsured. I told her that I can take her to the 4th floor of this building to meet with the [facility] DSS Medicaid Eligibility case workers to begin a Medicaid application if she is eligible.  The information for the caseworkers was on her AVS.    After she had her labwork done, she left prior to seeing the Medicaid caseworkers.

## 2022-11-05 NOTE — Assessment & Plan Note (Signed)
Cannot take statins will obtain lipids labs may need to consider Zetia or alternatives

## 2022-11-05 NOTE — Assessment & Plan Note (Signed)
Prior thyroid ultrasound negative will monitor

## 2022-11-05 NOTE — Patient Instructions (Addendum)
Medicaid office located 4th floor [address]   go there today  Medication refilled   Stop amlodipine causing swelling  Labs today  Our case manager met with you for housing options  REturn 2 months

## 2022-11-05 NOTE — Assessment & Plan Note (Signed)
Living in her car since September 2023 will connect to case management services

## 2022-11-05 NOTE — Assessment & Plan Note (Signed)
As per above assessment hypertensive heart disease

## 2022-11-06 LAB — CBC WITH DIFFERENTIAL/PLATELET
Basophils Absolute: 0.1 10*3/uL (ref 0.0–0.2)
Basos: 1 %
EOS (ABSOLUTE): 0.3 10*3/uL (ref 0.0–0.4)
Eos: 5 %
Hematocrit: 41 % (ref 34.0–46.6)
Hemoglobin: 13.1 g/dL (ref 11.1–15.9)
Immature Grans (Abs): 0 10*3/uL (ref 0.0–0.1)
Immature Granulocytes: 0 %
Lymphocytes Absolute: 2.2 10*3/uL (ref 0.7–3.1)
Lymphs: 31 %
MCH: 27.6 pg (ref 26.6–33.0)
MCHC: 32 g/dL (ref 31.5–35.7)
MCV: 87 fL (ref 79–97)
Monocytes Absolute: 0.4 10*3/uL (ref 0.1–0.9)
Monocytes: 5 %
Neutrophils Absolute: 4.1 10*3/uL (ref 1.4–7.0)
Neutrophils: 58 %
Platelets: 223 10*3/uL (ref 150–450)
RBC: 4.74 x10E6/uL (ref 3.77–5.28)
RDW: 13.6 % (ref 11.7–15.4)
WBC: 7.1 10*3/uL (ref 3.4–10.8)

## 2022-11-06 LAB — LIPID PANEL
Chol/HDL Ratio: 3.4 ratio (ref 0.0–4.4)
Cholesterol, Total: 179 mg/dL (ref 100–199)
HDL: 53 mg/dL (ref >39)
LDL Chol Calc (NIH): 97 mg/dL (ref 0–99)
Triglycerides: 165 mg/dL — ABNORMAL HIGH (ref 0–149)
VLDL Cholesterol Cal: 29 mg/dL (ref 5–40)

## 2022-11-06 LAB — COMPREHENSIVE METABOLIC PANEL
ALT: 11 IU/L (ref 0–32)
AST: 17 IU/L (ref 0–40)
Albumin: 4.1 g/dL (ref 3.8–4.9)
Alkaline Phosphatase: 97 IU/L (ref 44–121)
BUN/Creatinine Ratio: 11 (ref 9–23)
BUN: 13 mg/dL (ref 6–24)
Bilirubin Total: 0.3 mg/dL (ref 0.0–1.2)
CO2: 25 mmol/L (ref 20–29)
Calcium: 9.6 mg/dL (ref 8.7–10.2)
Chloride: 104 mmol/L (ref 96–106)
Creatinine, Ser: 1.2 mg/dL — ABNORMAL HIGH (ref 0.57–1.00)
Globulin, Total: 3.1 g/dL (ref 1.5–4.5)
Glucose: 140 mg/dL — ABNORMAL HIGH (ref 70–99)
Potassium: 3.8 mmol/L (ref 3.5–5.2)
Sodium: 144 mmol/L (ref 134–144)
Total Protein: 7.2 g/dL (ref 6.0–8.5)
eGFR: 53 mL/min/{1.73_m2} — ABNORMAL LOW (ref >59)

## 2022-11-06 LAB — THYROID PANEL WITH TSH
Free Thyroxine Index: 1.8 (ref 1.2–4.9)
T3 Uptake Ratio: 28 % (ref 24–39)
T4, Total: 6.4 ug/dL (ref 4.5–12.0)
TSH: 6.23 u[IU]/mL — ABNORMAL HIGH (ref 0.450–4.500)

## 2022-11-08 NOTE — Progress Notes (Signed)
Let pt know thyroid is normal no change in thyroid dosing, kidney stable , liver normal, cholesterol normal blood counts normal

## 2022-11-09 ENCOUNTER — Telehealth: Payer: Self-pay

## 2022-11-09 NOTE — Telephone Encounter (Signed)
Pt was called and is aware of results, DOB was confirmed.  ?

## 2022-11-09 NOTE — Telephone Encounter (Signed)
-----   Message from Shan Levans sent at 11/08/2022  9:11 AM EDT ----- Let pt know thyroid is normal no change in thyroid dosing, kidney stable , liver normal, cholesterol normal blood counts normal

## 2022-11-13 ENCOUNTER — Ambulatory Visit (HOSPITAL_BASED_OUTPATIENT_CLINIC_OR_DEPARTMENT_OTHER): Payer: Self-pay | Attending: Critical Care Medicine | Admitting: Internal Medicine

## 2022-11-13 DIAGNOSIS — G4733 Obstructive sleep apnea (adult) (pediatric): Secondary | ICD-10-CM

## 2022-12-02 ENCOUNTER — Encounter: Payer: Self-pay | Admitting: Critical Care Medicine

## 2022-12-02 ENCOUNTER — Other Ambulatory Visit (HOSPITAL_COMMUNITY): Payer: Self-pay

## 2022-12-02 ENCOUNTER — Other Ambulatory Visit: Payer: Self-pay

## 2023-01-10 NOTE — Progress Notes (Unsigned)
Established Patient Office Visit  Subjective:  Patient ID: Suzanne Cline, female    DOB: 12/06/1965  Age: 57 y.o. MRN: [id number]  No chief complaint on file.    HPI 03/2021 [name] presents for post hospital follow-up.  The patient recently in November underwent total hysterectomy and she states since that time there is been some improvement in her pelvic pain but she continues to have heartburn symptoms fullness in the upper abdominal area with upper abdominal discomfort.  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  She still has short-term disability that is set to run out this week.  She is can apply for unemployment.  Her last day of work was November 14, 2020.  We she did have FMLA but this ran out in October.  She states her blood sugars at home run 95-100.  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  She was recommended to be on Carafate and proton pump inhibitors she is off these at this time.  She notes increased urinary frequency pain in the upper gastrointestinal tract and swelling in the abdomen.  Her abdominal process from the lower abdomen is improving after the hysterectomy for severe uterine fibroids.   12/10/21 Patient is seen in return follow-up not been seen since December 2022.  On arrival blood pressure is 119/77 and blood sugar is elevated 302.  She has been off all of her diabetic medications for the past month as she ran out of medications and cannot obtain refills.  Note patient is uninsured will need patient assistance for her medications.  Another issue is she stopped taking the Lantus because she was gaining weight with it and did not like this sensation.  The patient has adopted a lifestyle management process and she is eating a plant-based diet at this time and trying to exercise more.  Since the last visit she did have a total hysterectomy and salpingo for ectomy she  still has her ovaries.  This was for uterine fibroids.  The abdominal pain did not improve significantly with this procedure as was anticipated.  The patient also was seen by gastroenterology who ultimately referred her to sports medicine for her arthritis type symptoms.  She was found to have a polyarthralgia syndrome with no specific type of osteoarthritis and sports medicine.  She has an elevated sed rate.  The work-up also showed a new thyroid nodule at the base of her previous partial thyroid resection.  She has been referred to the thyroid surgeon for further evaluation of same.  Patient is interested in resuming some type of diabetic medication at this time.  She would like to avoid further insulin products.  On arrival her A1c is elevated at 10.  Patient's blood pressure program does consist ofCarvedilol spironolactone and amlodipine.  The patient has stopped taking Diovan Note patient has statin induced myopathy cannot take statins also cannot tolerate metformin even XR formant  11/05/22 This patient is seen in return follow-up having not been seen since August 2023.  Unfortunately the patient became homeless in September 2023.  She lost her job and rent increased in her home she had to leave her home become homeless.  She now lives in her car which is a Scientist, clinical (histocompatibility and immunogenetics).  The patient has not been able to access her medications at this time.  She just obtained some refills recently.  She has been traveling locally and across  the state and does have a storage facility for her vitamins.  She is trying to work with [facility] to obtain Medicaid as she is currently now uninsured.  She is taking a part-time job at [employer] in her car.  It is difficult to obtain gas for her car but she is managing with the part-time job she currently has.  She makes less than $20,000 a year.  Patient's not been able to monitor her blood sugar.  On arrival A1c is 7.9.  The patient maintains the carvedilol 25 mg  twice daily amlodipine 10 mg daily and losartan 25 mg daily with this she is having significant lower extremity edema.  Patient is on the Trulicity 1.5 mg weekly and Farxiga 10 mg daily along with the glipizide daily  Patient needs follow-up labs.  01/12/23  Past Medical History:  Diagnosis Date   CHF (congestive heart failure) (HCC)    Chronic constipation 06/29/2019   Diabetes mellitus without complication (HCC)    Difficult intubation    Esophageal eosinophilia 08/25/2019   Family history of adverse reaction to anesthesia    Fibroids, intramural    s/p uterine artery embolization   Former cigarette smoker 04/2011   History of Resolved Nonischemic Congestive Cardiomyopathy (HCC) 04/2011   Echo March 2013: EF 35% with mild/moderate diastolic dysfunction -> Follow-Up Echo March 2014 EF 55% with G1 DD.   Hypertension    Hypertensive heart disease    Difficult control hypertension   Hypothyroidism    Mild concentric left ventricular hypertrophy (LVH) 10/08/2019   Moderate mitral valve regurgitation 10/08/2019   Echo done June 2021; Dr. Herbie Baltimore   Neuromuscular disorder [facility])    diabetic neuropathy   OSA (obstructive sleep apnea)    Sleep apnea    doesn't use cpap regularly   Thyroid disease    post thyroidectomy   Thyroid mass of unclear etiology 11/27/2021   Status post thyroidectomy.      Past Surgical History:  Procedure Laterality Date   ABLATION     uterus   COLONOSCOPY     2017 -  normal: in [place], [place]    DIAGNOSTIC LAPAROSCOPY     for fibroids   DILATION AND CURETTAGE OF UTERUS     HYSTERECTOMY ABDOMINAL WITH SALPINGECTOMY Bilateral 03/04/2021   Procedure: HYSTERECTOMY ABDOMINAL WITH BILATERAL SALPINGECTOMY;  Surgeon: Hermina Staggers, MD;  Location: [facility] OR;  Service: Gynecology;  Laterality: Bilateral;  TAP BLOCK   LEFT HEART CATH AND CORONARY ANGIOGRAPHY  05/19/2011   ([facility]): Normal coronary arteries   THYROIDECTOMY     TRANSTHORACIC  ECHOCARDIOGRAM  07/18/2011   [facility] [place]: EF 35% with mild to moderate DD. -->   TRANSTHORACIC ECHOCARDIOGRAM     [facility] [place]: EF 55%, G1 DD.  LVIDd 4.3; July 12, 2015: EF 55 to 60%.  Normal valves   TUBAL LIGATION     UPPER GASTROINTESTINAL ENDOSCOPY  07/06/2019    Family History  Problem Relation Age of Onset   Congestive Heart Failure Mother    Breast cancer Neg Hx    Colon cancer Neg Hx    Esophageal cancer Neg Hx    Rectal cancer Neg Hx    Stomach cancer Neg Hx    Inflammatory bowel disease Neg Hx    Liver disease Neg Hx    Pancreatic cancer Neg Hx     Social History   Socioeconomic History   Marital status: Single    Spouse name: Not on file  Number of children: 2   Years of education: Not on file   Highest education level: Not on file  Occupational History   Not on file  Tobacco Use   Smoking status: Former    Types: Cigars    Quit date: 03/2019    Years since quitting: 3.7   Smokeless tobacco: Never   Tobacco comments:    smokes occasional cigar  Vaping Use   Vaping status: Every Day   Devices: hemp  Substance and Sexual Activity   Alcohol use: Yes    Comment: social   Drug use: Yes    Types: Marijuana   Sexual activity: Not Currently  Other Topics Concern   Not on file  Social History Narrative   Not on file   Social Determinants of Health   Financial Resource Strain: Not on file  Food Insecurity: No Food Insecurity (03/31/2021)   Hunger Vital Sign    Worried About Running Out of Food in the Last Year: Never true    Ran Out of Food in the Last Year: Never true  Transportation Needs: No Transportation Needs (03/31/2021)   PRAPARE - Administrator, Civil Service (Medical): No    Lack of Transportation (Non-Medical): No  Physical Activity: Not on file  Stress: Not on file  Social Connections: Not on file  Intimate Partner Violence: Not on file    Outpatient Medications Prior to Visit  Medication  Sig Dispense Refill   acyclovir (ZOVIRAX) 400 MG tablet Take 1 tablet (400 mg total) by mouth 5 (five) times daily. 60 tablet 1   amLODipine (NORVASC) 10 MG tablet Take 1 tablet (10 mg total) by mouth daily. 90 tablet 1   carvedilol (COREG) 25 MG tablet Take 1 tablet (25 mg total) by mouth 2 (two) times daily with a meal. 180 tablet 3   dapagliflozin propanediol (FARXIGA) 10 MG TABS tablet Take 1 tablet (10 mg total) by mouth daily before breakfast. 30 tablet 6   Dulaglutide (TRULICITY) 1.5 MG/0.5ML SOPN Inject 1.5 mg into the skin once a week. 2 mL 3   furosemide (LASIX) 20 MG tablet Take 1 tablet (20 mg total) by mouth 2 (two) times daily as needed. 60 tablet 2   glipiZIDE (GLUCOTROL) 5 MG tablet Take 1 tablet (5 mg total) by mouth 2 (two) times daily before a meal. 60 tablet 3   losartan (COZAAR) 25 MG tablet Take 1 tablet (25 mg total) by mouth daily. 90 tablet 2   SYNTHROID 150 MCG tablet Take 1 tablet (150 mcg total) by mouth daily. 90 tablet 3   TRUEplus Lancets 28G MISC Use as directed 100 each 4   Vitamin D, Ergocalciferol, (DRISDOL) 1.25 MG (50000 UNIT) CAPS capsule Take 1 capsule (50,000 Units total) by mouth every 7 (seven) days. 12 capsule 4   No facility-administered medications prior to visit.    Allergies  Allergen Reactions   Statins Other (See Comments)    Myopathy   Chlorhexidine     Itching,redness   Lisinopril Cough   Oxycodone Itching    ROS Review of Systems  Constitutional:  Negative for unexpected weight change.  HENT: Negative.  Negative for ear pain, postnasal drip, rhinorrhea, sinus pressure, sore throat, trouble swallowing and voice change.   Eyes: Negative.   Respiratory: Negative.  Negative for apnea, cough, choking, chest tightness, shortness of breath, wheezing and stridor.   Cardiovascular:  Positive for leg swelling. Negative for chest pain and palpitations.  Gastrointestinal:  Negative  for abdominal distention, abdominal pain, anal bleeding, blood  in stool, constipation, diarrhea, nausea and vomiting.       Reflux symptoms  Genitourinary: Negative.   Musculoskeletal:  Positive for myalgias. Negative for arthralgias.  Skin: Negative.  Negative for rash.  Allergic/Immunologic: Negative.  Negative for environmental allergies and food allergies.  Neurological: Negative.  Negative for dizziness, syncope, weakness and headaches.  Hematological: Negative.  Negative for adenopathy. Does not bruise/bleed easily.  Psychiatric/Behavioral:  Positive for decreased concentration, dysphoric mood and sleep disturbance. Negative for agitation, behavioral problems, confusion, hallucinations, self-injury and suicidal ideas. The patient is nervous/anxious. The patient is not hyperactive.       Objective:    Physical Exam Vitals reviewed.  Constitutional:      Appearance: Normal appearance. She is well-developed. She is obese. She is not diaphoretic.  HENT:     Head: Normocephalic and atraumatic.     Nose: No nasal deformity, septal deviation, mucosal edema or rhinorrhea.     Right Sinus: No maxillary sinus tenderness or frontal sinus tenderness.     Left Sinus: No maxillary sinus tenderness or frontal sinus tenderness.     Mouth/Throat:     Pharynx: No oropharyngeal exudate.  Eyes:     General: No scleral icterus.    Conjunctiva/sclera: Conjunctivae normal.     Pupils: Pupils are equal, round, and reactive to light.  Neck:     Thyroid: No thyromegaly.     Vascular: No carotid bruit or JVD.     Trachea: Trachea normal. No tracheal tenderness or tracheal deviation.  Cardiovascular:     Rate and Rhythm: Normal rate and regular rhythm.     Chest Wall: PMI is not displaced.     Pulses: Normal pulses. No decreased pulses.     Heart sounds: Normal heart sounds, S1 normal and S2 normal. Heart sounds not distant. No murmur heard.    No systolic murmur is present.     No diastolic murmur is present.     No friction rub. No gallop. No S3 or S4  sounds.  Pulmonary:     Effort: No tachypnea, accessory muscle usage or respiratory distress.     Breath sounds: No stridor. No decreased breath sounds, wheezing, rhonchi or rales.  Chest:     Chest wall: No tenderness.  Abdominal:     General: Bowel sounds are normal. There is no distension.     Palpations: Abdomen is soft. Abdomen is not rigid.     Tenderness: There is no abdominal tenderness. There is no guarding or rebound.  Musculoskeletal:        General: No swelling. Normal range of motion.     Cervical back: Normal range of motion and neck supple. No edema, erythema or rigidity. No muscular tenderness. Normal range of motion.     Right lower leg: Edema present.     Left lower leg: Edema present.     Comments: Bilateral edema mid tibia and down to the feet  Lymphadenopathy:     Head:     Right side of head: No submental or submandibular adenopathy.     Left side of head: No submental or submandibular adenopathy.     Cervical: No cervical adenopathy.  Skin:    General: Skin is warm and dry.     Coloration: Skin is not pale.     Findings: No rash.     Nails: There is no clubbing.  Neurological:     Mental Status: She is  alert and oriented to person, place, and time.     Sensory: No sensory deficit.  Psychiatric:        Attention and Perception: Attention normal.        Mood and Affect: Mood is depressed.        Speech: Speech normal.        Behavior: Behavior is withdrawn. Behavior is cooperative.        Thought Content: Thought content normal. Thought content does not include homicidal or suicidal ideation. Thought content does not include homicidal or suicidal plan.        Cognition and Memory: Cognition normal.        Judgment: Judgment normal.    No exam this is a phone visit There were no vitals taken for this visit. Wt Readings from Last 3 Encounters:  11/05/22 172 lb 12.8 oz (78.4 kg)  05/06/22 175 lb 6.4 oz (79.6 kg)  12/22/21 170 lb (77.1 kg)     Health  Maintenance Due  Topic Date Due   MAMMOGRAM  03/29/2021   COLON CANCER SCREENING ANNUAL FOBT  05/02/2021   OPHTHALMOLOGY EXAM  10/08/2021   Diabetic kidney evaluation - Urine ACR  12/11/2022   COVID-19 Vaccine (1 - 2023-24 season) Never done   FOOT EXAM  12/11/2022    There are no preventive care reminders to display for this patient.  Lab Results  Component Value Date   TSH 6.230 (H) 11/05/2022   Lab Results  Component Value Date   WBC 7.1 11/05/2022   HGB 13.1 11/05/2022   HCT 41.0 11/05/2022   MCV 87 11/05/2022   PLT 223 11/05/2022   Lab Results  Component Value Date   NA 144 11/05/2022   K 3.8 11/05/2022   CO2 25 11/05/2022   GLUCOSE 140 (H) 11/05/2022   BUN 13 11/05/2022   CREATININE 1.20 (H) 11/05/2022   BILITOT 0.3 11/05/2022   ALKPHOS 97 11/05/2022   AST 17 11/05/2022   ALT 11 11/05/2022   PROT 7.2 11/05/2022   ALBUMIN 4.1 11/05/2022   CALCIUM 9.6 11/05/2022   ANIONGAP 7 03/06/2021   EGFR 53 (L) 11/05/2022   GFR 46.73 (L) 12/22/2021   Lab Results  Component Value Date   CHOL 179 11/05/2022   Lab Results  Component Value Date   HDL 53 11/05/2022   Lab Results  Component Value Date   LDLCALC 97 11/05/2022   Lab Results  Component Value Date   TRIG 165 (H) 11/05/2022   Lab Results  Component Value Date   CHOLHDL 3.4 11/05/2022   Lab Results  Component Value Date   HGBA1C 7.9 (A) 11/05/2022      Assessment & Plan:   Problem List Items Addressed This Visit   None  45 minutes spent extra time needed to assess multiple problems and S [facility] issues including homelessness Referred to the Medicaid office to see if we can get her Medicaid  No follow-ups on file.  Shan Levans   Mammogram was ordered fecal occult kit for colon cancer screening was issued

## 2023-01-12 ENCOUNTER — Encounter: Payer: Self-pay | Admitting: Critical Care Medicine

## 2023-01-12 ENCOUNTER — Ambulatory Visit: Payer: Self-pay | Attending: Critical Care Medicine | Admitting: Critical Care Medicine

## 2023-01-12 ENCOUNTER — Other Ambulatory Visit: Payer: Self-pay

## 2023-01-12 ENCOUNTER — Telehealth: Payer: Self-pay

## 2023-01-12 VITALS — BP 134/85 | HR 64 | Wt 175.2 lb

## 2023-01-12 DIAGNOSIS — E1165 Type 2 diabetes mellitus with hyperglycemia: Secondary | ICD-10-CM

## 2023-01-12 DIAGNOSIS — T466X5A Adverse effect of antihyperlipidemic and antiarteriosclerotic drugs, initial encounter: Secondary | ICD-10-CM

## 2023-01-12 DIAGNOSIS — G720 Drug-induced myopathy: Secondary | ICD-10-CM

## 2023-01-12 DIAGNOSIS — E890 Postprocedural hypothyroidism: Secondary | ICD-10-CM

## 2023-01-12 DIAGNOSIS — Z7984 Long term (current) use of oral hypoglycemic drugs: Secondary | ICD-10-CM

## 2023-01-12 DIAGNOSIS — Z794 Long term (current) use of insulin: Secondary | ICD-10-CM

## 2023-01-12 DIAGNOSIS — I110 Hypertensive heart disease with heart failure: Secondary | ICD-10-CM

## 2023-01-12 DIAGNOSIS — I5030 Unspecified diastolic (congestive) heart failure: Secondary | ICD-10-CM

## 2023-01-12 DIAGNOSIS — I10 Essential (primary) hypertension: Secondary | ICD-10-CM

## 2023-01-12 DIAGNOSIS — Z7985 Long-term (current) use of injectable non-insulin antidiabetic drugs: Secondary | ICD-10-CM

## 2023-01-12 DIAGNOSIS — E119 Type 2 diabetes mellitus without complications: Secondary | ICD-10-CM

## 2023-01-12 DIAGNOSIS — Z5902 Unsheltered homelessness: Secondary | ICD-10-CM

## 2023-01-12 MED ORDER — FUROSEMIDE 20 MG PO TABS
20.0000 mg | ORAL_TABLET | Freq: Two times a day (BID) | ORAL | 2 refills | Status: AC | PRN
  Filled 2023-01-12: qty 60, 30d supply, fill #0

## 2023-01-12 MED ORDER — CARVEDILOL 25 MG PO TABS
25.0000 mg | ORAL_TABLET | Freq: Two times a day (BID) | ORAL | 3 refills | Status: AC
  Filled 2023-01-12 – 2023-05-21 (×3): qty 180, 90d supply, fill #0

## 2023-01-12 MED ORDER — ACYCLOVIR 400 MG PO TABS
400.0000 mg | ORAL_TABLET | Freq: Every day | ORAL | 1 refills | Status: AC
  Filled 2023-01-12 – 2023-05-21 (×3): qty 60, 12d supply, fill #0

## 2023-01-12 MED ORDER — LOSARTAN POTASSIUM 25 MG PO TABS
25.0000 mg | ORAL_TABLET | Freq: Every day | ORAL | 2 refills | Status: AC
  Filled 2023-01-12: qty 90, 90d supply, fill #0

## 2023-01-12 MED ORDER — TRULICITY 1.5 MG/0.5ML ~~LOC~~ SOAJ
1.5000 mg | SUBCUTANEOUS | 3 refills | Status: DC
  Filled 2023-01-12 – 2023-05-12 (×7): qty 2, 28d supply, fill #0
  Filled 2023-05-12: qty 2, 28d supply, fill #1
  Filled 2023-05-20 – 2023-05-21 (×2): qty 2, 28d supply, fill #0
  Filled 2023-06-13: qty 2, 28d supply, fill #1
  Filled 2023-07-19: qty 2, 28d supply, fill #2

## 2023-01-12 MED ORDER — SYNTHROID 150 MCG PO TABS
150.0000 ug | ORAL_TABLET | Freq: Every day | ORAL | 3 refills | Status: AC
  Filled 2023-01-12 – 2023-03-17 (×2): qty 90, 90d supply, fill #0
  Filled 2023-05-20 – 2023-11-20 (×2): qty 90, 90d supply, fill #1

## 2023-01-12 MED ORDER — GLIPIZIDE 5 MG PO TABS
5.0000 mg | ORAL_TABLET | Freq: Two times a day (BID) | ORAL | 3 refills | Status: AC
  Filled 2023-01-12 – 2023-03-17 (×2): qty 60, 30d supply, fill #0
  Filled 2023-05-20 – 2023-05-21 (×2): qty 60, 30d supply, fill #1
  Filled 2023-09-13: qty 60, 30d supply, fill #2

## 2023-01-12 MED ORDER — VITAMIN D (ERGOCALCIFEROL) 1.25 MG (50000 UNIT) PO CAPS
50000.0000 [IU] | ORAL_CAPSULE | ORAL | 4 refills | Status: AC
  Filled 2023-01-12 – 2023-05-21 (×3): qty 12, 84d supply, fill #0
  Filled 2023-06-13: qty 12, 84d supply, fill #1

## 2023-01-12 MED ORDER — AMLODIPINE BESYLATE 10 MG PO TABS
10.0000 mg | ORAL_TABLET | Freq: Every day | ORAL | 1 refills | Status: AC
  Filled 2023-01-12 – 2023-08-25 (×2): qty 90, 90d supply, fill #0
  Filled 2023-08-28: qty 90, 90d supply, fill #1

## 2023-01-12 MED ORDER — DAPAGLIFLOZIN PROPANEDIOL 10 MG PO TABS
10.0000 mg | ORAL_TABLET | Freq: Every day | ORAL | 6 refills | Status: AC
Start: 2023-01-12 — End: ?
  Filled 2023-01-12 – 2023-01-26 (×2): qty 30, 30d supply, fill #0
  Filled 2023-01-26: qty 120, 120d supply, fill #0
  Filled 2023-05-21: qty 30, 30d supply, fill #0
  Filled 2023-05-21: qty 90, 90d supply, fill #1

## 2023-01-12 MED ORDER — TRUEPLUS LANCETS 28G MISC
4 refills | Status: AC
  Filled 2023-01-12: qty 100, fill #0

## 2023-01-12 NOTE — Assessment & Plan Note (Signed)
Blood pressure at goal no change in medication

## 2023-01-12 NOTE — Assessment & Plan Note (Signed)
-  Cannot take statin

## 2023-01-12 NOTE — Assessment & Plan Note (Signed)
Will partner with case management

## 2023-01-12 NOTE — Patient Instructions (Addendum)
Medicaid office located 4th floor [address]  Labs today All medications refilled no changes Return 4 months Case manager spoke to you about housing

## 2023-01-12 NOTE — Telephone Encounter (Addendum)
At the request of Dr Delford Field, I met with the patient when she was in the clinic today.  She is still living in her car and would like to find permanent housing so she would be able to work a steady job.  She said her work is done remotely but she has no place to work on a regular basis.  Her income is now limited to the pay from working temporary positions.   She feels that not having a steady income will limit her options for housing.  I reminded her that we spoke about housing resources a few months ago and the options are very limited.  She said she is aware of the [facility] but has not met with anyone about housing. She has called [facility] but could not give me an update on that. I asked if she had contacted the [facility] and she said she was not sure.   I provided her with the phone humber for the [facility] and encouraged her to call.  I also explained  that I can contact Beth G/ [employer]  [facility] to see if the has any resources for her.  I explained that Beth's apartments are $750/month and that may be hard for her to pay without a steady income.  The patient gave me approval to reach out to [facility] and ask her to call her ( patient) .  I called Okey Dupre and left a message requesting a call back.    I also instructed the patient to meet with the [facility] DSS Medicaid Eligibility Case workers about applying for Medicaid. She said she would do that prior to leaving [facility].  She did not follow up with the caseworkers as she said she would when she was at [facility] in July 2024.

## 2023-01-12 NOTE — Assessment & Plan Note (Signed)
No change in medication continue same

## 2023-01-14 ENCOUNTER — Telehealth: Payer: Self-pay

## 2023-01-14 NOTE — Telephone Encounter (Signed)
-----   Message from Shan Levans sent at 01/14/2023  4:38 AM EDT ----- Let patient know the protein in the urine has improved kidney function is stable

## 2023-01-14 NOTE — Telephone Encounter (Signed)
I spoke to [employer] transitional housing and informed her that the patient is interested in her housing options.  Beth was driving and requested that I text her the patient's phone number.  She said she has something available and would call the patient.  I then text the phone number as requested.

## 2023-01-14 NOTE — Telephone Encounter (Signed)
Pt was called and vm was left, Information has been sent to nurse pool.   

## 2023-01-14 NOTE — Progress Notes (Signed)
Let patient know the protein in the urine has improved kidney function is stable

## 2023-01-26 ENCOUNTER — Other Ambulatory Visit: Payer: Self-pay

## 2023-01-26 ENCOUNTER — Encounter: Payer: Self-pay | Admitting: Critical Care Medicine

## 2023-01-26 ENCOUNTER — Other Ambulatory Visit (HOSPITAL_COMMUNITY): Payer: Self-pay

## 2023-01-27 ENCOUNTER — Telehealth: Payer: Self-pay

## 2023-01-27 NOTE — Telephone Encounter (Signed)
Message received that patient had a question about housing. I called her and she said that she never heard from the person wh o was supposed to be calling her about the room that is $750/month.  I told her that I would follow up and call [facility] tomorrow.  I also reminded her to contact the [employer] to inquire if they have any information about available apartments. She understands that she will need to have a job in order to pay the rent .  She said she has 2 job offers for remote work but needs a place to stay so she can do the work.    She told me she applied for Medicaid and is waiting to hear if she was approved.    Regarding the sleep study, I told her that we have not received the report of the study yet.  I asked her how she would be able to use a CPAP machine while staying in her car  and he said that she has a portable generator that she would be able to use,

## 2023-01-28 ENCOUNTER — Other Ambulatory Visit: Payer: Self-pay

## 2023-01-28 NOTE — Telephone Encounter (Signed)
I called Beth/[facility] transitional housing and requested that she try to contact the patient again.  I provided her with the patient's first name and phone number and Waynetta Sandy said she would call her.

## 2023-01-29 ENCOUNTER — Other Ambulatory Visit: Payer: Self-pay

## 2023-02-01 ENCOUNTER — Other Ambulatory Visit: Payer: Self-pay

## 2023-03-02 ENCOUNTER — Encounter: Payer: Self-pay | Admitting: Critical Care Medicine

## 2023-03-03 ENCOUNTER — Other Ambulatory Visit: Payer: Self-pay | Admitting: Physician Assistant

## 2023-03-03 DIAGNOSIS — G479 Sleep disorder, unspecified: Secondary | ICD-10-CM

## 2023-03-03 NOTE — Telephone Encounter (Signed)
I seen in the chart where the patient was told to request a split night study (inpatient), But no one seemed to follow up with patient

## 2023-03-08 ENCOUNTER — Other Ambulatory Visit: Payer: Self-pay

## 2023-03-08 ENCOUNTER — Other Ambulatory Visit (HOSPITAL_COMMUNITY): Payer: Self-pay

## 2023-03-08 ENCOUNTER — Telehealth: Payer: Self-pay

## 2023-03-08 ENCOUNTER — Encounter (HOSPITAL_COMMUNITY): Payer: Self-pay

## 2023-03-08 NOTE — Telephone Encounter (Signed)
Called patient and pharmacy on why she hasn't received her medication. She is on PASS program for meds and Pharmacy has contacted multiply times via phone and mychart but no answer. She was upset and questioning why. I did let her know that the pharmacy has notes on file for her to call first before filling. She ended the call by say they need to call her.  I let Mardee Postin know and she or staff will call her later on today.   She is aware of sleep study orders and will wait for there call

## 2023-03-10 ENCOUNTER — Other Ambulatory Visit (HOSPITAL_COMMUNITY): Payer: Self-pay

## 2023-03-17 ENCOUNTER — Other Ambulatory Visit (HOSPITAL_COMMUNITY): Payer: Self-pay

## 2023-03-17 ENCOUNTER — Other Ambulatory Visit: Payer: Self-pay

## 2023-04-19 ENCOUNTER — Ambulatory Visit (HOSPITAL_BASED_OUTPATIENT_CLINIC_OR_DEPARTMENT_OTHER): Payer: Medicaid Other | Attending: Physician Assistant | Admitting: Internal Medicine

## 2023-04-19 VITALS — Ht 62.0 in | Wt 175.0 lb

## 2023-04-19 DIAGNOSIS — I493 Ventricular premature depolarization: Secondary | ICD-10-CM | POA: Diagnosis not present

## 2023-04-19 DIAGNOSIS — G4733 Obstructive sleep apnea (adult) (pediatric): Secondary | ICD-10-CM | POA: Diagnosis not present

## 2023-04-19 DIAGNOSIS — G479 Sleep disorder, unspecified: Secondary | ICD-10-CM

## 2023-05-01 DIAGNOSIS — G479 Sleep disorder, unspecified: Secondary | ICD-10-CM

## 2023-05-06 ENCOUNTER — Telehealth: Payer: Self-pay

## 2023-05-06 NOTE — Telephone Encounter (Addendum)
 Provider received fax from patient insurance [employer] requesting more information to avoid a delay or denial of requested CPAP. Call placed to [facility], Spoke with Veva, Veva advised that patient CPAP has already been approved by her insurance.

## 2023-05-12 ENCOUNTER — Other Ambulatory Visit: Payer: Self-pay

## 2023-05-12 ENCOUNTER — Other Ambulatory Visit (HOSPITAL_COMMUNITY): Payer: Self-pay

## 2023-05-13 ENCOUNTER — Encounter: Payer: Self-pay | Admitting: Pharmacist

## 2023-05-13 ENCOUNTER — Other Ambulatory Visit: Payer: Self-pay

## 2023-05-13 ENCOUNTER — Other Ambulatory Visit (HOSPITAL_COMMUNITY): Payer: Self-pay

## 2023-05-14 ENCOUNTER — Other Ambulatory Visit: Payer: Self-pay

## 2023-05-18 ENCOUNTER — Other Ambulatory Visit: Payer: Self-pay

## 2023-05-20 ENCOUNTER — Other Ambulatory Visit (HOSPITAL_COMMUNITY): Payer: Self-pay

## 2023-05-20 ENCOUNTER — Encounter: Payer: Self-pay | Admitting: Critical Care Medicine

## 2023-05-21 ENCOUNTER — Other Ambulatory Visit (HOSPITAL_COMMUNITY): Payer: Self-pay

## 2023-05-21 ENCOUNTER — Other Ambulatory Visit: Payer: Self-pay | Admitting: Family Medicine

## 2023-05-21 ENCOUNTER — Other Ambulatory Visit: Payer: Self-pay

## 2023-05-21 MED ORDER — MISC. DEVICES MISC: 0 refills | Status: AC

## 2023-06-01 ENCOUNTER — Encounter: Payer: Self-pay | Admitting: Family Medicine

## 2023-06-01 ENCOUNTER — Telehealth (HOSPITAL_BASED_OUTPATIENT_CLINIC_OR_DEPARTMENT_OTHER): Payer: Medicaid Other | Admitting: Family Medicine

## 2023-06-01 DIAGNOSIS — G4733 Obstructive sleep apnea (adult) (pediatric): Secondary | ICD-10-CM | POA: Diagnosis not present

## 2023-06-01 NOTE — Progress Notes (Signed)
 Virtual Visit via Video Note  I connected with Suzanne Cline, on 06/01/2023 at 1:27 PM by video enabled telemedicine device and verified that I am speaking with the correct person using two identifiers.   Consent: I discussed the limitations, risks, security and privacy concerns of performing an evaluation and management service by telemedicine and the availability of in person appointments. I also discussed with the patient that there may be a patient responsible charge related to this service. The patient expressed understanding and agreed to proceed.   Location of Patient: Work  Government Social Research Officer of Provider: Clinic   Persons participating in Telemedicine visit: Suzanne Cline Dr. Delbert     History of Present Illness: Suzanne Cline is a 58 y.o. year old female  with history of type 2 diabetes mellitus, hypertension, obstructive sleep apnea.   Discussed the use of AI scribe software for clinical note transcription with the patient, who gave verbal consent to proceed.  [name], a patient with a history of sleep apnea diagnosed in 2016, presents for a follow-up visit. She reports symptoms of frequent awakenings during the night, excessive daytime sleepiness, and episodes of apnea during sleep lasting up to a minute or longer. These symptoms have been ongoing and have led to significant disruption in her sleep quality. She has previously used a CPAP machine, which was prescribed in 2016, but it is now outdated and in need of replacement.     Study report from 03/2023 reveals severe obstructive sleep apnea, AHI 81.4/hour.  Past Medical History:  Diagnosis Date   CHF (congestive heart failure) (HCC)    Chronic constipation 06/29/2019   Diabetes mellitus without complication (HCC)    Difficult intubation    Esophageal eosinophilia 08/25/2019   Family history of adverse reaction to anesthesia    Fibroids, intramural    s/p uterine artery embolization   Former  cigarette smoker 04/2011   History of Resolved Nonischemic Congestive Cardiomyopathy (HCC) 04/2011   Echo March 2013: EF 35% with mild/moderate diastolic dysfunction -> Follow-Up Echo March 2014 EF 55% with G1 DD.   Hypertension    Hypertensive heart disease    Difficult control hypertension   Hypothyroidism    Mild concentric left ventricular hypertrophy (LVH) 10/08/2019   Moderate mitral valve regurgitation 10/08/2019   Echo done June 2021; Dr. Anner   Neuromuscular disorder [facility])    diabetic neuropathy   OSA (obstructive sleep apnea)    Sleep apnea    doesn't use cpap regularly   Thyroid  disease    post thyroidectomy   Thyroid  mass of unclear etiology 11/27/2021   Status post thyroidectomy.     Allergies  Allergen Reactions   Statins Other (See Comments)    Myopathy   Chlorhexidine      Itching,redness   Lisinopril Cough   Oxycodone  Itching    Current Outpatient Medications on File Prior to Visit  Medication Sig Dispense Refill   acyclovir  (ZOVIRAX ) 400 MG tablet Take 1 tablet (400 mg total) by mouth 5 (five) times daily. 60 tablet 1   amLODipine  (NORVASC ) 10 MG tablet Take 1 tablet (10 mg total) by mouth daily. 90 tablet 1   carvedilol  (COREG ) 25 MG tablet Take 1 tablet (25 mg total) by mouth 2 (two) times daily with a meal. 180 tablet 3   dapagliflozin  propanediol (FARXIGA ) 10 MG TABS tablet Take 1 tablet (10 mg total) by mouth daily before breakfast. 30 tablet 6   Dulaglutide  (TRULICITY ) 1.5 MG/0.5ML SOAJ Inject 1.5 mg  into the skin once a week. 2 mL 3   furosemide  (LASIX ) 20 MG tablet Take 1 tablet (20 mg total) by mouth 2 (two) times daily as needed. 60 tablet 2   glipiZIDE  (GLUCOTROL ) 5 MG tablet Take 1 tablet (5 mg total) by mouth 2 (two) times daily before a meal. 60 tablet 3   losartan  (COZAAR ) 25 MG tablet Take 1 tablet (25 mg total) by mouth daily. 90 tablet 2   Misc. Devices MISC CPAP machine. AutoPAP 8-18cm. Extra Small size Fisher[employer]Paykel Full Face Evora Full  mask and heated humidification. Dx- Obstructive sleep apnea 1 each 0   SYNTHROID  150 MCG tablet Take 1 tablet (150 mcg total) by mouth daily. 90 tablet 3   TRUEplus Lancets 28G MISC Use as directed 100 each 4   Vitamin D , Ergocalciferol , (DRISDOL ) 1.25 MG (50000 UNIT) CAPS capsule Take 1 capsule (50,000 Units total) by mouth every 7 (seven) days. 12 capsule 4   No current facility-administered medications on file prior to visit.    ROS: See HPI  Observations/Objective: Awake, alert, oriented x3 Not in acute distress Normal mood      Latest Ref Rng & Units 01/12/2023    3:39 PM 11/05/2022   10:59 AM 05/06/2022    4:56 PM  CMP  Glucose 70 - 99 mg/dL 748  859  696   BUN 6 - 24 mg/dL 18  13  23    Creatinine 0.57 - 1.00 mg/dL 8.75  8.79  8.68   Sodium 134 - 144 mmol/L 142  144  141   Potassium 3.5 - 5.2 mmol/L 4.1  3.8  4.1   Chloride 96 - 106 mmol/L 101  104  102   CO2 20 - 29 mmol/L 24  25  24    Calcium  8.7 - 10.2 mg/dL 9.6  9.6  9.0   Total Protein 6.0 - 8.5 g/dL  7.2  6.8   Total Bilirubin 0.0 - 1.2 mg/dL  0.3  <9.7   Alkaline Phos 44 - 121 IU/L  97  98   AST 0 - 40 IU/L  17  14   ALT 0 - 32 IU/L  11  9     Lipid Panel     Component Value Date/Time   CHOL 179 11/05/2022 1059   TRIG 165 (H) 11/05/2022 1059   HDL 53 11/05/2022 1059   CHOLHDL 3.4 11/05/2022 1059   LDLCALC 97 11/05/2022 1059   LABVLDL 29 11/05/2022 1059    Lab Results  Component Value Date   HGBA1C 7.9 (A) 11/05/2022     Assessment and Plan:     Obstructive Sleep Apnea Previously diagnosed and managed with CPAP machine. Reports symptoms of frequent nocturnal awakenings and daytime fatigue. Current machine outdated and needs replacement. -Documented symptoms and need for updated CPAP machine. -Case manager to facilitate obtaining new CPAP machine.  General Health Maintenance / Followup Plans -Advised patient to schedule appointment with new primary care provider for ongoing management of  diabetes and routine care.        Follow Up Instructions: To schedule appointment for chronic disease management   I discussed the assessment and treatment plan with the patient. The patient was provided an opportunity to ask questions and all were answered. The patient agreed with the plan and demonstrated an understanding of the instructions.   The patient was advised to call back or seek an in-person evaluation if the symptoms worsen or if the condition fails to improve as anticipated.  I provided 11 minutes total of Telehealth time during this encounter including median intraservice time, reviewing previous notes, investigations, ordering medications, medical decision making, coordinating care and patient verbalized understanding at the end of the visit.     Corrina Sabin, MD, FAAFP. [facility] [place], [place] [phone]   06/01/2023, 1:27 PM

## 2023-06-01 NOTE — Patient Instructions (Signed)

## 2023-06-11 DIAGNOSIS — G4733 Obstructive sleep apnea (adult) (pediatric): Secondary | ICD-10-CM | POA: Diagnosis not present

## 2023-06-12 DIAGNOSIS — G4733 Obstructive sleep apnea (adult) (pediatric): Secondary | ICD-10-CM | POA: Diagnosis not present

## 2023-06-14 ENCOUNTER — Other Ambulatory Visit (HOSPITAL_COMMUNITY): Payer: Self-pay

## 2023-06-26 DIAGNOSIS — G4733 Obstructive sleep apnea (adult) (pediatric): Secondary | ICD-10-CM | POA: Diagnosis not present

## 2023-07-09 DIAGNOSIS — G4733 Obstructive sleep apnea (adult) (pediatric): Secondary | ICD-10-CM | POA: Diagnosis not present

## 2023-07-19 ENCOUNTER — Other Ambulatory Visit (HOSPITAL_COMMUNITY): Payer: Self-pay

## 2023-07-27 DIAGNOSIS — G4733 Obstructive sleep apnea (adult) (pediatric): Secondary | ICD-10-CM | POA: Diagnosis not present

## 2023-08-09 DIAGNOSIS — G4733 Obstructive sleep apnea (adult) (pediatric): Secondary | ICD-10-CM | POA: Diagnosis not present

## 2023-08-10 ENCOUNTER — Other Ambulatory Visit: Payer: Self-pay | Admitting: Critical Care Medicine

## 2023-08-11 ENCOUNTER — Other Ambulatory Visit (HOSPITAL_COMMUNITY): Payer: Self-pay

## 2023-08-11 MED ORDER — TRULICITY 1.5 MG/0.5ML ~~LOC~~ SOAJ
1.5000 mg | SUBCUTANEOUS | 0 refills | Status: DC
  Filled 2023-08-11: qty 6, 84d supply, fill #0
  Filled 2023-08-25: qty 2, 28d supply, fill #0
  Filled 2023-09-13 – 2023-09-15 (×2): qty 2, 28d supply, fill #1
  Filled 2023-10-14 (×2): qty 2, 28d supply, fill #2

## 2023-08-12 ENCOUNTER — Other Ambulatory Visit (HOSPITAL_COMMUNITY): Payer: Self-pay

## 2023-08-18 DIAGNOSIS — H6123 Impacted cerumen, bilateral: Secondary | ICD-10-CM | POA: Diagnosis not present

## 2023-08-18 DIAGNOSIS — R42 Dizziness and giddiness: Secondary | ICD-10-CM | POA: Diagnosis not present

## 2023-08-18 DIAGNOSIS — I169 Hypertensive crisis, unspecified: Secondary | ICD-10-CM | POA: Diagnosis not present

## 2023-08-18 DIAGNOSIS — R110 Nausea: Secondary | ICD-10-CM | POA: Diagnosis not present

## 2023-08-18 DIAGNOSIS — E1169 Type 2 diabetes mellitus with other specified complication: Secondary | ICD-10-CM | POA: Diagnosis not present

## 2023-08-18 DIAGNOSIS — Z794 Long term (current) use of insulin: Secondary | ICD-10-CM | POA: Diagnosis not present

## 2023-08-18 DIAGNOSIS — I951 Orthostatic hypotension: Secondary | ICD-10-CM | POA: Diagnosis not present

## 2023-08-18 DIAGNOSIS — I152 Hypertension secondary to endocrine disorders: Secondary | ICD-10-CM | POA: Diagnosis not present

## 2023-08-25 ENCOUNTER — Encounter: Payer: Self-pay | Admitting: Family Medicine

## 2023-08-25 ENCOUNTER — Other Ambulatory Visit (HOSPITAL_COMMUNITY): Payer: Self-pay

## 2023-08-25 ENCOUNTER — Other Ambulatory Visit: Payer: Self-pay

## 2023-08-26 ENCOUNTER — Other Ambulatory Visit: Payer: Self-pay

## 2023-08-30 ENCOUNTER — Other Ambulatory Visit (HOSPITAL_COMMUNITY): Payer: Self-pay

## 2023-09-03 DIAGNOSIS — Z59819 Housing instability, housed unspecified: Secondary | ICD-10-CM | POA: Diagnosis not present

## 2023-09-03 DIAGNOSIS — E559 Vitamin D deficiency, unspecified: Secondary | ICD-10-CM | POA: Diagnosis not present

## 2023-09-03 DIAGNOSIS — E890 Postprocedural hypothyroidism: Secondary | ICD-10-CM | POA: Diagnosis not present

## 2023-09-03 DIAGNOSIS — H8111 Benign paroxysmal vertigo, right ear: Secondary | ICD-10-CM | POA: Diagnosis not present

## 2023-09-03 DIAGNOSIS — M7989 Other specified soft tissue disorders: Secondary | ICD-10-CM | POA: Diagnosis not present

## 2023-09-03 DIAGNOSIS — E1165 Type 2 diabetes mellitus with hyperglycemia: Secondary | ICD-10-CM | POA: Diagnosis not present

## 2023-09-03 DIAGNOSIS — I5032 Chronic diastolic (congestive) heart failure: Secondary | ICD-10-CM | POA: Diagnosis not present

## 2023-09-08 DIAGNOSIS — G4733 Obstructive sleep apnea (adult) (pediatric): Secondary | ICD-10-CM | POA: Diagnosis not present

## 2023-09-14 ENCOUNTER — Other Ambulatory Visit (HOSPITAL_COMMUNITY): Payer: Self-pay

## 2023-10-09 DIAGNOSIS — G4733 Obstructive sleep apnea (adult) (pediatric): Secondary | ICD-10-CM | POA: Diagnosis not present

## 2023-10-14 ENCOUNTER — Other Ambulatory Visit: Payer: Self-pay

## 2023-10-19 ENCOUNTER — Encounter: Payer: Self-pay | Admitting: Family Medicine

## 2023-10-19 ENCOUNTER — Other Ambulatory Visit: Payer: Self-pay

## 2023-10-19 ENCOUNTER — Ambulatory Visit: Attending: Family Medicine | Admitting: Family Medicine

## 2023-10-19 VITALS — BP 123/73 | HR 73 | Ht 62.0 in | Wt 168.4 lb

## 2023-10-19 DIAGNOSIS — F900 Attention-deficit hyperactivity disorder, predominantly inattentive type: Secondary | ICD-10-CM | POA: Diagnosis not present

## 2023-10-19 DIAGNOSIS — E1129 Type 2 diabetes mellitus with other diabetic kidney complication: Secondary | ICD-10-CM

## 2023-10-19 DIAGNOSIS — E1165 Type 2 diabetes mellitus with hyperglycemia: Secondary | ICD-10-CM | POA: Diagnosis not present

## 2023-10-19 DIAGNOSIS — G720 Drug-induced myopathy: Secondary | ICD-10-CM

## 2023-10-19 DIAGNOSIS — Z5902 Unsheltered homelessness: Secondary | ICD-10-CM | POA: Diagnosis not present

## 2023-10-19 DIAGNOSIS — I110 Hypertensive heart disease with heart failure: Secondary | ICD-10-CM | POA: Diagnosis not present

## 2023-10-19 DIAGNOSIS — Z1211 Encounter for screening for malignant neoplasm of colon: Secondary | ICD-10-CM

## 2023-10-19 DIAGNOSIS — Z7985 Long-term (current) use of injectable non-insulin antidiabetic drugs: Secondary | ICD-10-CM

## 2023-10-19 DIAGNOSIS — E785 Hyperlipidemia, unspecified: Secondary | ICD-10-CM

## 2023-10-19 DIAGNOSIS — R809 Proteinuria, unspecified: Secondary | ICD-10-CM | POA: Diagnosis not present

## 2023-10-19 DIAGNOSIS — R41840 Attention and concentration deficit: Secondary | ICD-10-CM

## 2023-10-19 DIAGNOSIS — E890 Postprocedural hypothyroidism: Secondary | ICD-10-CM | POA: Diagnosis not present

## 2023-10-19 DIAGNOSIS — F419 Anxiety disorder, unspecified: Secondary | ICD-10-CM | POA: Diagnosis not present

## 2023-10-19 DIAGNOSIS — Z7984 Long term (current) use of oral hypoglycemic drugs: Secondary | ICD-10-CM

## 2023-10-19 DIAGNOSIS — R42 Dizziness and giddiness: Secondary | ICD-10-CM | POA: Diagnosis not present

## 2023-10-19 DIAGNOSIS — E1169 Type 2 diabetes mellitus with other specified complication: Secondary | ICD-10-CM

## 2023-10-19 DIAGNOSIS — Z1231 Encounter for screening mammogram for malignant neoplasm of breast: Secondary | ICD-10-CM

## 2023-10-19 MED ORDER — TRULICITY 1.5 MG/0.5ML ~~LOC~~ SOAJ
1.5000 mg | SUBCUTANEOUS | 3 refills | Status: AC
  Filled 2023-10-19: qty 6, 84d supply, fill #0
  Filled 2023-11-20: qty 2, 28d supply, fill #0
  Filled 2024-02-05 – 2024-02-25 (×2): qty 2, 28d supply, fill #1
  Filled 2024-03-22: qty 2, 28d supply, fill #2
  Filled 2024-04-18: qty 2, 28d supply, fill #3
  Filled 2024-05-17: qty 2, 28d supply, fill #4

## 2023-10-19 NOTE — Patient Instructions (Signed)
 VISIT SUMMARY:  You came in today for a follow-up on your chronic medical conditions. We discussed your diabetes, heart failure, hypertension, hypothyroidism, vertigo, and anxiety. We also reviewed your general health maintenance needs.  YOUR PLAN:  -TYPE 2 DIABETES MELLITUS: Type 2 diabetes is a condition where your body does not use insulin  properly, leading to high blood sugar levels. Your A1c level is currently 8.6, which indicates that your blood sugar is not well-controlled. You have resumed taking Farxiga  and are continuing with Trulicity . We will monitor your A1c again in August. Please continue taking Trulicity  1.5 mg once a week and Farxiga  10 mg daily. Your Trulicity  prescription has been refilled.  -CONGESTIVE HEART FAILURE: Congestive heart failure is a condition where your heart does not pump blood as well as it should. You have diastolic heart failure and have not seen your cardiologist since 2021. It is important to follow up with a cardiologist for proper management. We will refer you to cardiology for further evaluation.  -HYPERTENSION: Hypertension is high blood pressure. Your blood pressure is well-controlled with your current medications. Please continue taking your current antihypertensive medications.  -HYPOTHYROIDISM: Hypothyroidism is a condition where your thyroid  gland does not produce enough thyroid  hormone. Your TSH levels were high likely due to not taking your medication. You have resumed taking Synthroid . We will check your TSH levels again. Please continue taking Synthroid  150 mcg daily.  -VERTIGO: Vertigo is a sensation of spinning dizziness. You experienced vertigo in April, which was treated with Meclizine. You can continue taking Meclizine as needed for any future episodes of dizziness.  -DEPRESSION AND ANXIETY: You are experiencing anxiety and difficulty focusing, which may be related to personal stressors. You expressed interest in therapy and medication. We  will refer you to psychiatry for evaluation and arrange for therapy sessions.  -GENERAL HEALTH MAINTENANCE: You are due for a mammogram and colonoscopy. We will refer you for these screenings. You mentioned concerns about Medicaid coverage, and we will assist you with this as needed.  INSTRUCTIONS:  Please schedule a follow-up appointment in August to review your lab results and medication adherence. We will also monitor your A1c and TSH levels at that time. Additionally, follow up with the cardiologist and psychiatrist as referred.

## 2023-10-19 NOTE — Progress Notes (Signed)
 Subjective:  Patient ID: Suzanne Cline, female    DOB: 09-13-1965  Age: 58 y.o. MRN: [id number]  CC: Medical Management of Chronic Issues     Discussed the use of AI scribe software for clinical note transcription with the patient, who gave verbal consent to proceed.  History of Present Illness Suzanne Cline is a 58 year old female with a history of type 2 diabetes mellitus, HFpEF, postsurgical hypothyroidism, hypertension, hyperlipidemia, homelessness who presents for follow-up of her chronic medical conditions.  In April 2025, she experienced vertigo, leading to an emergency room visit.  She had a follow-up [facility] in 08/2023 at which time Epley maneuver was performed which did provide some improvement.  Meclizine alleviated her dizziness, and she currently has no dizziness or syncope.  She has hypothyroidism and was unable to access levothyroxine  for two months due to financial constraints. Her last thyroid  test in April showed a high TSH level. She has resumed her medication.  Her type 2 diabetes is managed with Trulicity  1.5 mg, glipizide  and Farxiga , the latter of which she had not taken for about two months due to access issues but has recently resumed. Her last A1c was 8.6.  She has hypertension and diastolic heart failure, initially diagnosed in [place]. She has not seen her cardiologist since 2021.  Planes of chest pain or dyspnea.  She experiences muscle cramps when taking statins for cholesterol management.  She experiences anxiety and difficulty focusing, attributed to personal stressors, including concerns about her son in [place] .  She would like to be screened for ADHD.  She is self-employed as a [employer] manager, which involves long hours and impacts her eating habits, often limiting her to one or two meals a day.    Past Medical History:  Diagnosis Date   CHF (congestive heart failure) (HCC)    Chronic constipation 06/29/2019    Diabetes mellitus without complication (HCC)    Difficult intubation    Esophageal eosinophilia 08/25/2019   Family history of adverse reaction to anesthesia    Fibroids, intramural    s/p uterine artery embolization   Former cigarette smoker 04/2011   History of Resolved Nonischemic Congestive Cardiomyopathy (HCC) 04/2011   Echo March 2013: EF 35% with mild/moderate diastolic dysfunction -> Follow-Up Echo March 2014 EF 55% with G1 DD.   Hypertension    Hypertensive heart disease    Difficult control hypertension   Hypothyroidism    Mild concentric left ventricular hypertrophy (LVH) 10/08/2019   Moderate mitral valve regurgitation 10/08/2019   Echo done June 2021; Dr. Anner   Neuromuscular disorder [facility])    diabetic neuropathy   OSA (obstructive sleep apnea)    Sleep apnea    doesn't use cpap regularly   Thyroid  disease    post thyroidectomy   Thyroid  mass of unclear etiology 11/27/2021   Status post thyroidectomy.      Past Surgical History:  Procedure Laterality Date   ABLATION     uterus   COLONOSCOPY     2017 -  normal: in [place], [place]    DIAGNOSTIC LAPAROSCOPY     for fibroids   DILATION AND CURETTAGE OF UTERUS     HYSTERECTOMY ABDOMINAL WITH SALPINGECTOMY Bilateral 03/04/2021   Procedure: HYSTERECTOMY ABDOMINAL WITH BILATERAL SALPINGECTOMY;  Surgeon: Lorence Ozell CROME, MD;  Location: [facility] OR;  Service: Gynecology;  Laterality: Bilateral;  TAP BLOCK   LEFT HEART CATH AND CORONARY ANGIOGRAPHY  05/19/2011   ([facility]): Normal coronary arteries  THYROIDECTOMY     TRANSTHORACIC ECHOCARDIOGRAM  07/18/2011   [facility] [place]: EF 35% with mild to moderate DD. -->   TRANSTHORACIC ECHOCARDIOGRAM     [facility] [place]: EF 55%, G1 DD.  LVIDd 4.3; July 12, 2015: EF 55 to 60%.  Normal valves   TUBAL LIGATION     UPPER GASTROINTESTINAL ENDOSCOPY  07/06/2019    Family History  Problem Relation Age of Onset   Congestive Heart Failure Mother     Breast cancer Neg Hx    Colon cancer Neg Hx    Esophageal cancer Neg Hx    Rectal cancer Neg Hx    Stomach cancer Neg Hx    Inflammatory bowel disease Neg Hx    Liver disease Neg Hx    Pancreatic cancer Neg Hx     Social History   Socioeconomic History   Marital status: Single    Spouse name: Not on file   Number of children: 2   Years of education: Not on file   Highest education level: Not on file  Occupational History   Not on file  Tobacco Use   Smoking status: Former    Types: Cigars    Quit date: 03/2019    Years since quitting: 4.5   Smokeless tobacco: Never   Tobacco comments:    smokes occasional cigar  Vaping Use   Vaping status: Every Day   Devices: hemp  Substance and Sexual Activity   Alcohol use: Yes    Comment: social   Drug use: Yes    Types: Marijuana   Sexual activity: Not Currently  Other Topics Concern   Not on file  Social History Narrative   Not on file   Social Drivers of Health   Financial Resource Strain: High Risk (01/12/2023)   Overall Financial Resource Strain (CARDIA)    Difficulty of Paying Living Expenses: Very hard  Food Insecurity: No Food Insecurity (03/31/2021)   Hunger Vital Sign    Worried About Running Out of Food in the Last Year: Never true    Ran Out of Food in the Last Year: Never true  Transportation Needs: No Transportation Needs (03/31/2021)   PRAPARE - Administrator, Civil Service (Medical): No    Lack of Transportation (Non-Medical): No  Physical Activity: Not on file  Stress: Not on file  Social Connections: Not on file    Allergies  Allergen Reactions   Statins Other (See Comments)    Myopathy   Chlorhexidine      Itching,redness   Lisinopril Cough   Oxycodone  Itching    Outpatient Medications Prior to Visit  Medication Sig Dispense Refill   acyclovir  (ZOVIRAX ) 400 MG tablet Take 1 tablet (400 mg total) by mouth 5 (five) times daily. 60 tablet 1   amLODipine  (NORVASC ) 10 MG tablet Take  1 tablet (10 mg total) by mouth daily. 90 tablet 1   carvedilol  (COREG ) 25 MG tablet Take 1 tablet (25 mg total) by mouth 2 (two) times daily with a meal. 180 tablet 3   dapagliflozin  propanediol (FARXIGA ) 10 MG TABS tablet Take 1 tablet (10 mg total) by mouth daily before breakfast. 30 tablet 6   furosemide  (LASIX ) 20 MG tablet Take 1 tablet (20 mg total) by mouth 2 (two) times daily as needed. 60 tablet 2   glipiZIDE  (GLUCOTROL ) 5 MG tablet Take 1 tablet (5 mg total) by mouth 2 (two) times daily before a meal. 60 tablet 3   losartan  (COZAAR ) 25  MG tablet Take 1 tablet (25 mg total) by mouth daily. 90 tablet 2   Misc. Devices MISC CPAP machine. AutoPAP 8-18cm. Extra Small size [employer] Full Face Evora Full mask and heated humidification. Dx- Obstructive sleep apnea 1 each 0   SYNTHROID  150 MCG tablet Take 1 tablet (150 mcg total) by mouth daily. 90 tablet 3   TRUEplus Lancets 28G MISC Use as directed 100 each 4   Vitamin D , Ergocalciferol , (DRISDOL ) 1.25 MG (50000 UNIT) CAPS capsule Take 1 capsule (50,000 Units total) by mouth every 7 (seven) days. 12 capsule 4   Dulaglutide  (TRULICITY ) 1.5 MG/0.5ML SOAJ Inject 1.5 mg into the skin once a week. Please schedule PCP follow-up with Dr. Stylianos Stradling 6 mL 0   No facility-administered medications prior to visit.     ROS Review of Systems  Constitutional:  Negative for activity change and appetite change.  HENT:  Negative for sinus pressure and sore throat.   Respiratory:  Negative for chest tightness, shortness of breath and wheezing.   Cardiovascular:  Negative for chest pain and palpitations.  Gastrointestinal:  Negative for abdominal distention, abdominal pain and constipation.  Genitourinary: Negative.   Musculoskeletal: Negative.   Psychiatric/Behavioral:  Negative for behavioral problems and dysphoric mood.        Positive for anxiety    Objective:  BP 123/73   Pulse 73   Ht 5' 2 (1.575 m)   Wt 168 lb 6.4 oz (76.4 kg)   SpO2 97%    BMI 30.80 kg/m      10/19/2023    1:53 PM 04/19/2023    9:38 PM 01/12/2023    2:32 PM  BP/Weight  Systolic BP 123  865  Diastolic BP 73  85  Wt. (Lbs) 168.4 175 175.2  BMI 30.8 kg/m2 32.01 kg/m2 31.04 kg/m2      Physical Exam Constitutional:      Appearance: She is well-developed.   Cardiovascular:     Rate and Rhythm: Normal rate.     Heart sounds: Normal heart sounds. No murmur heard. Pulmonary:     Effort: Pulmonary effort is normal.     Breath sounds: Normal breath sounds. No wheezing or rales.  Chest:     Chest wall: No tenderness.  Abdominal:     General: Bowel sounds are normal. There is no distension.     Palpations: Abdomen is soft. There is no mass.     Tenderness: There is no abdominal tenderness.   Musculoskeletal:        General: Normal range of motion.     Right lower leg: No edema.     Left lower leg: No edema.   Neurological:     Mental Status: She is alert and oriented to person, place, and time.   Psychiatric:        Mood and Affect: Mood normal.        Latest Ref Rng & Units 01/12/2023    3:39 PM 11/05/2022   10:59 AM 05/06/2022    4:56 PM  CMP  Glucose 70 - 99 mg/dL 748  859  696   BUN 6 - 24 mg/dL 18  13  23    Creatinine 0.57 - 1.00 mg/dL 8.75  8.79  8.68   Sodium 134 - 144 mmol/L 142  144  141   Potassium 3.5 - 5.2 mmol/L 4.1  3.8  4.1   Chloride 96 - 106 mmol/L 101  104  102   CO2 20 - 29 mmol/L 24  25  24   Calcium  8.7 - 10.2 mg/dL 9.6  9.6  9.0   Total Protein 6.0 - 8.5 g/dL  7.2  6.8   Total Bilirubin 0.0 - 1.2 mg/dL  0.3  <9.7   Alkaline Phos 44 - 121 IU/L  97  98   AST 0 - 40 IU/L  17  14   ALT 0 - 32 IU/L  11  9     Lipid Panel     Component Value Date/Time   CHOL 179 11/05/2022 1059   TRIG 165 (H) 11/05/2022 1059   HDL 53 11/05/2022 1059   CHOLHDL 3.4 11/05/2022 1059   LDLCALC 97 11/05/2022 1059    CBC    Component Value Date/Time   WBC 7.1 11/05/2022 1059   WBC 6.7 09/10/2021 1527   RBC 4.74 11/05/2022 1059    RBC 4.38 09/10/2021 1527   HGB 13.1 11/05/2022 1059   HCT 41.0 11/05/2022 1059   PLT 223 11/05/2022 1059   MCV 87 11/05/2022 1059   MCH 27.6 11/05/2022 1059   MCH 28.3 03/05/2021 0132   MCHC 32.0 11/05/2022 1059   MCHC 32.7 09/10/2021 1527   RDW 13.6 11/05/2022 1059   LYMPHSABS 2.2 11/05/2022 1059   MONOABS 0.4 09/10/2021 1527   EOSABS 0.3 11/05/2022 1059   BASOSABS 0.1 11/05/2022 1059    Lab Results  Component Value Date   HGBA1C 7.9 (A) 11/05/2022    Labs from care everywhere A1c 8.6 - from 08/31/23  TSH 19.748 - from 07/2023   1. Anxiety (Primary) Declines medication but will benefit from counseling - Ambulatory referral to Psychiatry  2. Inattention Would love to be screened for ADHD so psych referral has been placed - Ambulatory referral to Psychiatry  3. Encounter for screening mammogram for malignant neoplasm of breast - MM 3D SCREENING MAMMOGRAM BILATERAL BREAST; Future  4. Screening for colon cancer - Ambulatory referral to Gastroenterology  5. Hypertensive heart disease with diastolic heart failure (HCC) Euvolemic with EF of 60 to 65%, mild LVH from echo of 09/2019 Asymptomatic Last seen by Dr. Anner in 2021 Continue SGLT2i, beta-blocker - Ambulatory referral to Cardiology  6. Postoperative hypothyroidism Last TSH was elevated She endorses nonadherence with levothyroxine  prior to labs At this moment adherence has improved Will check TSH at next visit  7. Type 2 diabetes mellitus with hyperglycemia, without long-term current use of insulin  (HCC) Uncontrolled with A1c of 8.6 Medication non adherence contributing -has been out of Farxiga  for 1 to 2 months Will check A1c at next visit and adjust regimen accordingly - Dulaglutide  (TRULICITY ) 1.5 MG/0.5ML SOAJ; Inject 1.5 mg into the skin once a week. Please schedule PCP follow-up with Dr. Jaye Polidori  Dispense: 6 mL; Refill: 3  8. Statin myopathy Not on a statin Will benefit from PCSK9 inhibitor but will  defer to cardiology regarding this  9. Unsheltered homelessness Currently resides in her car Will benefit from [facility] referral  10. Hyperlipidemia associated with type 2 diabetes mellitus (HCC) See #8 above  11. Microalbuminuria due to type 2 diabetes mellitus (HCC) Currently on SGLT2i  12. Vertigo Improved Continue meclizine   Meds ordered this encounter  Medications   Dulaglutide  (TRULICITY ) 1.5 MG/0.5ML SOAJ    Sig: Inject 1.5 mg into the skin once a week. Please schedule PCP follow-up with Dr. Delbert    Dispense:  6 mL    Refill:  3    Follow-up: Return in about 2 months (around 12/19/2023) for Chronic medical conditions.  Visit required 46 minutes of patient care including median intraservice time, reviewing previous notes and test results, coordination of care, counseling the patient in addition to management of chronic medical conditions.Time also spent ordering medications, investigations and documenting in the chart.  All questions were answered to the patient's satisfaction    Corrina Sabin, MD, FAAFP. [facility] [place], [place] [phone]   10/19/2023, 5:16 PM

## 2023-11-08 DIAGNOSIS — G4733 Obstructive sleep apnea (adult) (pediatric): Secondary | ICD-10-CM | POA: Diagnosis not present

## 2023-11-20 ENCOUNTER — Other Ambulatory Visit (HOSPITAL_COMMUNITY): Payer: Self-pay

## 2023-11-22 ENCOUNTER — Other Ambulatory Visit: Payer: Self-pay

## 2023-11-26 ENCOUNTER — Ambulatory Visit

## 2023-11-30 NOTE — Procedures (Signed)
 Suzanne Cline

## 2023-11-30 NOTE — Procedures (Signed)
 SABRA

## 2023-12-09 DIAGNOSIS — G4733 Obstructive sleep apnea (adult) (pediatric): Secondary | ICD-10-CM | POA: Diagnosis not present

## 2023-12-17 ENCOUNTER — Telehealth: Payer: Self-pay | Admitting: Family Medicine

## 2023-12-17 NOTE — Telephone Encounter (Signed)
 Contacted pt to confirmed appt 8/22 pt answer but didn't say anything

## 2023-12-20 ENCOUNTER — Ambulatory Visit: Admitting: Family Medicine

## 2024-01-09 DIAGNOSIS — G4733 Obstructive sleep apnea (adult) (pediatric): Secondary | ICD-10-CM | POA: Diagnosis not present

## 2024-02-05 ENCOUNTER — Other Ambulatory Visit (HOSPITAL_COMMUNITY): Payer: Self-pay

## 2024-02-08 ENCOUNTER — Ambulatory Visit: Admitting: Family Medicine

## 2024-02-08 DIAGNOSIS — G4733 Obstructive sleep apnea (adult) (pediatric): Secondary | ICD-10-CM | POA: Diagnosis not present

## 2024-02-09 ENCOUNTER — Ambulatory Visit (HOSPITAL_COMMUNITY): Payer: Self-pay | Admitting: Psychiatry

## 2024-02-16 ENCOUNTER — Other Ambulatory Visit (HOSPITAL_COMMUNITY): Payer: Self-pay

## 2024-02-17 ENCOUNTER — Ambulatory Visit (HOSPITAL_COMMUNITY): Payer: Self-pay | Admitting: Clinical

## 2024-02-17 ENCOUNTER — Encounter (HOSPITAL_COMMUNITY): Payer: Self-pay

## 2024-02-17 ENCOUNTER — Other Ambulatory Visit (HOSPITAL_COMMUNITY): Payer: Self-pay

## 2024-02-17 DIAGNOSIS — F39 Unspecified mood [affective] disorder: Secondary | ICD-10-CM

## 2024-02-18 ENCOUNTER — Ambulatory Visit (HOSPITAL_COMMUNITY): Admitting: Psychiatry

## 2024-02-19 NOTE — Progress Notes (Signed)
 Client reported she was unable to connect to video and/or audio. Client reported she has no crisis needs. Client reported she would like to reschedule for in person appointments.

## 2024-02-21 ENCOUNTER — Ambulatory Visit (HOSPITAL_COMMUNITY): Payer: Self-pay | Admitting: Psychiatry

## 2024-02-23 ENCOUNTER — Encounter (HOSPITAL_COMMUNITY): Payer: Self-pay

## 2024-02-25 ENCOUNTER — Other Ambulatory Visit: Payer: Self-pay

## 2024-02-25 ENCOUNTER — Other Ambulatory Visit (HOSPITAL_COMMUNITY): Payer: Self-pay

## 2024-02-29 ENCOUNTER — Ambulatory Visit: Admitting: Family Medicine

## 2024-03-02 ENCOUNTER — Ambulatory Visit (INDEPENDENT_AMBULATORY_CARE_PROVIDER_SITE_OTHER): Admitting: Psychiatry

## 2024-03-02 ENCOUNTER — Encounter (HOSPITAL_COMMUNITY): Payer: Self-pay | Admitting: Psychiatry

## 2024-03-02 ENCOUNTER — Other Ambulatory Visit: Payer: Self-pay

## 2024-03-02 DIAGNOSIS — F1290 Cannabis use, unspecified, uncomplicated: Secondary | ICD-10-CM | POA: Diagnosis not present

## 2024-03-02 DIAGNOSIS — F32A Depression, unspecified: Secondary | ICD-10-CM | POA: Diagnosis not present

## 2024-03-02 DIAGNOSIS — F411 Generalized anxiety disorder: Secondary | ICD-10-CM

## 2024-03-02 MED ORDER — BUPROPION HCL ER (XL) 150 MG PO TB24
150.0000 mg | ORAL_TABLET | ORAL | 3 refills | Status: DC
  Filled 2024-03-02 – 2024-03-07 (×3): qty 30, 30d supply, fill #0
  Filled 2024-05-17: qty 30, 30d supply, fill #1

## 2024-03-02 NOTE — Progress Notes (Signed)
 Psychiatric Initial Adult Assessment  Virtual Visit via Video Note  I connected with Suzanne Cline on 03/02/24 at  1:30 PM EST by a video enabled telemedicine application and verified that I am speaking with the correct person using two identifiers.  Location: Patient: Home Provider: Clinic   I discussed the limitations of evaluation and management by telemedicine and the availability of in person appointments. The patient expressed understanding and agreed to proceed.  I provided 45 minutes of non-face-to-face time during this encounter.    Patient Identification: Suzanne Cline MRN:  [id number] Date of Evaluation:  03/02/2024 Referral Source: Paige Cozrt, LSW Chief Complaint:  I am stuggling with staying focused Visit Diagnosis: No diagnosis found.  History of Present Illness:  58 year old female seen today for initial psychiatric evaluations. She has no known psychiatric illnesses and is currently not managed on medications.  Today she is well groomed, pleasant, cooperative and engaged in conversations. She informed clinical research associate that she has been struggling staying focused and lacks motivation. Patient notes that she constantly procrastinates. She notes that at times she is forgetful, disorganized, and avoid things that are mentally taxing. She also notes that her listening skills are improving.  Patient informed writer that the symptoms presented about a year ago.  Provider asked patient if she was using alcohol or illegal substances.  She notes that she utilizes THC-a marijuana daily.  Provider informed patient that marijuana could be the cause of her poor concentration and encouraged her to reduce her consumption.  She notes that she will try.  Patient informed clinical research associate that for 2 years she was homeless.  She informed clinical research associate that recently she has been living with her son in [place].  She does note that this is a temporary arrangement in is looking for a permanent solution.   Patient notes that she worries about her finances.  She is self-employed as an barrister's clerk support person.  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  She notes that she finds that her inattentiveness is causing her issues with producing her product.  Patient informed clinical research associate that she has had several trauma throughout her lifetime.  She notes that her mother died when she was 47.  She informed clinical research associate that she was the eldest of 7 siblings.  She informed clinical research associate that she felt betrayed by her mother as her mother was not there for her and her siblings.  To cope patient notes that she avoids her siblings. She also notes that her brother was murdered which was traumatic.She denies flashbacks or avoidant behaviors.  Patient reports the above exacerbates her anxiety and depression.  Today provider conducted a GAD-7 and patient scored an 8.  Provider also conducted a PHQ-9 and she scored 13.  She endorses adequate sleep and poor appetite.  Patient informed clinical research associate that she eats 1 meal a day.  She notes that she got use to doing this since being homeless.  Today she endorses passive SI but denies wanting to harm self.  She denies SI/HI/AVH, mania, paranoia.  Today patient agreeable to starting Wellbutrin XL 150 mg to help manage concentration and symptoms of depression.  Provider recommend patient reduce her consumption of marijuana.  She will follow-up in 2 months for further evaluation.  No other concerns at this time.  He  Associated Signs/Symptoms: Depression Symptoms:  depressed mood, anhedonia, psychomotor agitation, feelings of worthlessness/guilt, difficulty concentrating, suicidal thoughts without  plan, anxiety, increased appetite, decreased appetite, (Hypo) Manic Symptoms:  Distractibility, Elevated Mood, Irritable Mood, Anxiety Symptoms:  Mild anxiety Psychotic Symptoms:  Denies PTSD Symptoms: Had a traumatic exposure:   Reports that she was homeless for two years. Also notes that her mother passed away when she was 15 of cardiac issues. Reports that she felt betrayed as her she was not there for her or 7 siblings. Also notes that her brother was murdered  Avoidance:  Decreased Interest/Participation  Past Psychiatric History: None  Previous Psychotropic Medications: Gabapentin   Substance Abuse History in the last 12 months:  Yes.    Consequences of Substance Abuse: NA  Past Medical History:  Past Medical History:  Diagnosis Date   CHF (congestive heart failure) (HCC)    Chronic constipation 06/29/2019   Diabetes mellitus without complication (HCC)    Difficult intubation    Esophageal eosinophilia 08/25/2019   Family history of adverse reaction to anesthesia    Fibroids, intramural    s/p uterine artery embolization   Former cigarette smoker 04/2011   History of Resolved Nonischemic Congestive Cardiomyopathy (HCC) 04/2011   Echo March 2013: EF 35% with mild/moderate diastolic dysfunction -> Follow-Up Echo March 2014 EF 55% with G1 DD.   Hypertension    Hypertensive heart disease    Difficult control hypertension   Hypothyroidism    Mild concentric left ventricular hypertrophy (LVH) 10/08/2019   Moderate mitral valve regurgitation 10/08/2019   Echo done June 2021; Dr. Anner   Neuromuscular disorder [facility])    diabetic neuropathy   OSA (obstructive sleep apnea)    Sleep apnea    doesn't use cpap regularly   Thyroid  disease    post thyroidectomy   Thyroid  mass of unclear etiology 11/27/2021   Status post thyroidectomy.      Past Surgical History:  Procedure Laterality Date   ABLATION     uterus   COLONOSCOPY     2017 -  normal: in [place], [place]    DIAGNOSTIC LAPAROSCOPY     for fibroids   DILATION AND CURETTAGE OF UTERUS     HYSTERECTOMY ABDOMINAL WITH SALPINGECTOMY Bilateral 03/04/2021   Procedure: HYSTERECTOMY ABDOMINAL WITH BILATERAL SALPINGECTOMY;  Surgeon: Lorence Ozell CROME, MD;   Location: [facility] OR;  Service: Gynecology;  Laterality: Bilateral;  TAP BLOCK   LEFT HEART CATH AND CORONARY ANGIOGRAPHY  05/19/2011   ([facility]): Normal coronary arteries   THYROIDECTOMY     TRANSTHORACIC ECHOCARDIOGRAM  07/18/2011   [facility]: EF 35% with mild to moderate DD. -->   TRANSTHORACIC ECHOCARDIOGRAM     [facility]: EF 55%, G1 DD.  LVIDd 4.3; July 12, 2015: EF 55 to 60%.  Normal valves   TUBAL LIGATION     UPPER GASTROINTESTINAL ENDOSCOPY  07/06/2019    Family Psychiatric History: Younger sister substance use, PSTD  Family History:  Family History  Problem Relation Age of Onset   Congestive Heart Failure Mother    Breast cancer Neg Hx    Colon cancer Neg Hx    Esophageal cancer Neg Hx    Rectal cancer Neg Hx    Stomach cancer Neg Hx    Inflammatory bowel disease Neg Hx    Liver disease Neg Hx    Pancreatic cancer Neg Hx     Social History:   Social History   Socioeconomic History   Marital status: Single    Spouse name: Not on file   Number of children: 2   Years of education:  Not on file   Highest education level: Not on file  Occupational History   Not on file  Tobacco Use   Smoking status: Former    Types: Cigars    Quit date: 03/2019    Years since quitting: 4.9   Smokeless tobacco: Never   Tobacco comments:    smokes occasional cigar  Vaping Use   Vaping status: Every Day   Devices: hemp  Substance and Sexual Activity   Alcohol use: Yes    Comment: social   Drug use: Yes    Types: Marijuana   Sexual activity: Not Currently  Other Topics Concern   Not on file  Social History Narrative   Not on file   Social Drivers of Health   Financial Resource Strain: High Risk (01/12/2023)   Overall Financial Resource Strain (CARDIA)    Difficulty of Paying Living Expenses: Very hard  Food Insecurity: No Food Insecurity (03/31/2021)   Hunger Vital Sign    Worried About Running Out of Food in the Last  Year: Never true    Ran Out of Food in the Last Year: Never true  Transportation Needs: No Transportation Needs (03/31/2021)   PRAPARE - Administrator, Civil Service (Medical): No    Lack of Transportation (Non-Medical): No  Physical Activity: Not on file  Stress: Not on file  Social Connections: Not on file    Additional Social History: Patient resides in [place] with her son. She has two sons. 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 She notes that she has not used tobacco in 6-7 years. She notes that she drinks alcohol socially. She endorses smoking marijuana daily.   Allergies:   Allergies  Allergen Reactions   Statins Other (See Comments)    Myopathy   Chlorhexidine      Itching,redness   Lisinopril Cough   Oxycodone  Itching    Metabolic Disorder Labs: Lab Results  Component Value Date   HGBA1C 7.9 (A) 11/05/2022   MPG 309 11/15/2020   No results found for: PROLACTIN Lab Results  Component Value Date   CHOL 179 11/05/2022   TRIG 165 (H) 11/05/2022   HDL 53 11/05/2022   CHOLHDL 3.4 11/05/2022   LDLCALC 97 11/05/2022   Lab Results  Component Value Date   TSH 6.230 (H) 11/05/2022    Therapeutic Level Labs: No results found for: LITHIUM No results found for: CBMZ No results found for: VALPROATE  Current Medications: Current Outpatient Medications  Medication Sig Dispense Refill   acyclovir  (ZOVIRAX ) 400 MG tablet Take 1 tablet (400 mg total) by mouth 5 (five) times daily. 60 tablet 1   amLODipine  (NORVASC ) 10 MG tablet Take 1 tablet (10 mg total) by mouth daily. 90 tablet 1   carvedilol  (COREG ) 25 MG tablet Take 1 tablet (25 mg total) by mouth 2 (two) times daily with a meal. 180 tablet 3   dapagliflozin  propanediol (FARXIGA ) 10 MG TABS tablet Take 1 tablet (10 mg total) by mouth daily before breakfast. 30 tablet 6   Dulaglutide  (TRULICITY ) 1.5 MG/0.5ML SOAJ Inject 1.5 mg into the skin once a week. Please schedule PCP follow-up  with Dr. Newlin 6 mL 3   furosemide  (LASIX ) 20 MG tablet Take 1 tablet (20 mg total) by mouth 2 (two) times daily as needed. 60 tablet 2   glipiZIDE  (GLUCOTROL ) 5 MG tablet Take 1 tablet (5 mg total) by mouth 2 (two) times daily before a meal. 60 tablet 3   losartan  (COZAAR )  25 MG tablet Take 1 tablet (25 mg total) by mouth daily. 90 tablet 2   Misc. Devices MISC CPAP machine. AutoPAP 8-18cm. Extra Small size [employer] Full Face Evora Full mask and heated humidification. Dx- Obstructive sleep apnea 1 each 0   SYNTHROID  150 MCG tablet Take 1 tablet (150 mcg total) by mouth daily. 90 tablet 3   TRUEplus Lancets 28G MISC Use as directed 100 each 4   Vitamin D , Ergocalciferol , (DRISDOL ) 1.25 MG (50000 UNIT) CAPS capsule Take 1 capsule (50,000 Units total) by mouth every 7 (seven) days. 12 capsule 4   No current facility-administered medications for this visit.    Musculoskeletal: Strength & Muscle Tone: within normal limits and Telehealth visit Gait & Station: normal, Telehealth visit Patient leans: N/A  Psychiatric Specialty Exam: Review of Systems  There were no vitals taken for this visit.There is no height or weight on file to calculate BMI.  General Appearance: Well Groomed  Eye Contact:  Good  Speech:  Clear and Coherent and Normal Rate  Volume:  Normal  Mood:  Anxious and Depressed  Affect:  Appropriate and Congruent  Thought Process:  Coherent, Goal Directed, and Linear  Orientation:  Full (Time, Place, and Person)  Thought Content:  WDL and Logical  Suicidal Thoughts:  Yes.  without intent/plan  Homicidal Thoughts:  No  Memory:  Immediate;   Fair Recent;   Fair Remote;   Fair  Judgement:  Good  Insight:  Good  Psychomotor Activity:  Normal  Concentration:  Concentration: Fair and Attention Span: Fair  Recall:  Good  Fund of Knowledge:Good  Language: Good  Akathisia:  No  Handed:  Right  AIMS (if indicated):  not done  Assets:  Communication Skills Desire for  Improvement Financial Resources/Insurance Housing Leisure Time Physical Health Social Support Talents/Skills Transportation  ADL's:  Intact  Cognition: WNL  Sleep:  Good   Screenings: GAD-7    Flowsheet Row Office Visit from 10/19/2023 in [facility] Of [place]. [facility] Office Visit from 11/05/2022 in [facility] [place] - A Dept Of [place] [facility]. [facility] Office Visit from 12/10/2021 in [facility] [place] - A Dept Of [place] [facility]. [facility] Office Visit from 11/25/2020 in [facility] [place] - A Dept Of [place] DEL. [facility] Office Visit from 11/14/2019 in [facility] [place] - A Dept Of [place] [facility]. [facility]  Total GAD-7 Score 5 4 0 7 0   PHQ2-9    Flowsheet Row Office Visit from 10/19/2023 in [facility] - A Dept Of [place]. [facility] Office Visit from 11/05/2022 in [facility] [place] - A Dept Of [place] [facility]. [facility] Office Visit from 12/10/2021 in [facility] [place] - A Dept Of [place] DEL. [facility] Office Visit from 03/31/2021 in [facility] Office Visit from 01/21/2021 in [facility] - A Dept Of [place]. [facility]  PHQ-2 Total Score 2 1 0 2 1  PHQ-9 Total Score 9 3 -- 5 --   Flowsheet Row Admission (Discharged) from 03/04/2021 in [facility]  SURGICAL Pre-Admission Testing 60 from 02/26/2021 in [facility] PREADMISSION TESTING ED from 10/03/2020 in [facility] Emergency Department at [facility]  C-SSRS RISK CATEGORY No  Risk No Risk No Risk    Assessment and Plan: Patient notes that she has poor concentration,increased anxiety/depression. She also uses marijuana daily. Today patient agreeable to start Wellbutrin XL 150 mg to help managed depression and  concentrations. Provider recommended reducing her consumption of marijuana as well. She endorsed understanding and agreed.  1. Mild depression (Primary)  Start- buPROPion (WELLBUTRIN XL) 150 MG 24 hr tablet; Take 1 tablet (150 mg total) by mouth every morning.  Dispense: 30 tablet; Refill: 3  2. Marijuana use   3. Anxiety state    Collaboration of Care: Other provider involved in patient's care AEB PCP and counselor  Patient/Guardian was advised Release of Information must be obtained prior to any record release in order to collaborate their care with an outside provider. Patient/Guardian was advised if they have not already done so to contact the registration department to sign all necessary forms in order for us  to release information regarding their care.   Consent: Patient/Guardian gives verbal consent for treatment and assignment of benefits for services provided during this visit. Patient/Guardian expressed understanding and agreed to proceed.   Zane FORBES Bach, NP 11/6/20251:36 PM

## 2024-03-07 ENCOUNTER — Other Ambulatory Visit: Payer: Self-pay

## 2024-03-07 ENCOUNTER — Other Ambulatory Visit (HOSPITAL_COMMUNITY): Payer: Self-pay

## 2024-03-22 ENCOUNTER — Other Ambulatory Visit (HOSPITAL_COMMUNITY): Payer: Self-pay

## 2024-04-10 ENCOUNTER — Ambulatory Visit: Admitting: Family Medicine

## 2024-04-10 ENCOUNTER — Ambulatory Visit: Payer: Self-pay

## 2024-04-10 NOTE — Telephone Encounter (Signed)
 First attempt to reach patient at 5:29. Message left to call back at [phone].

## 2024-04-10 NOTE — Telephone Encounter (Signed)
 Routing encounter to clinic for follow up.

## 2024-04-11 NOTE — Telephone Encounter (Signed)
There is no message attached.  

## 2024-04-18 ENCOUNTER — Other Ambulatory Visit: Payer: Self-pay

## 2024-05-17 ENCOUNTER — Encounter (HOSPITAL_COMMUNITY): Payer: Self-pay | Admitting: Psychiatry

## 2024-05-17 ENCOUNTER — Other Ambulatory Visit: Payer: Self-pay

## 2024-05-17 ENCOUNTER — Telehealth (HOSPITAL_COMMUNITY): Admitting: Psychiatry

## 2024-05-17 ENCOUNTER — Other Ambulatory Visit (HOSPITAL_COMMUNITY): Payer: Self-pay

## 2024-05-17 DIAGNOSIS — F411 Generalized anxiety disorder: Secondary | ICD-10-CM | POA: Diagnosis not present

## 2024-05-17 DIAGNOSIS — F32A Depression, unspecified: Secondary | ICD-10-CM | POA: Diagnosis not present

## 2024-05-17 MED ORDER — BUPROPION HCL ER (XL) 150 MG PO TB24
150.0000 mg | ORAL_TABLET | ORAL | 3 refills | Status: AC
  Filled 2024-05-17: qty 30, 30d supply, fill #0

## 2024-05-17 MED ORDER — HYDROXYZINE HCL 10 MG PO TABS
10.0000 mg | ORAL_TABLET | Freq: Three times a day (TID) | ORAL | 3 refills | Status: AC | PRN
  Filled 2024-05-17: qty 90, 30d supply, fill #0

## 2024-05-17 NOTE — Progress Notes (Signed)
 [facility] MD/PA/NP OP Progress Note Virtual Visit via Video Note  I connected with Suzanne Cline on 05/17/24 at  1:00 PM EST by a video enabled telemedicine application and verified that I am speaking with the correct person using two identifiers.  Location: Patient: Home Provider: Clinic   I discussed the limitations of evaluation and management by telemedicine and the availability of in person appointments. The patient expressed understanding and agreed to proceed.  I provided 30 minutes of non-face-to-face time during this encounter.    05/17/2024 2:02 PM Suzanne Cline  MRN:  [id number]  Chief Complaint: I am a little more focus and less tired HPI: 59 year old female seen today for follow up psychiatric evaluations. She has a psychiatric history of anxiety, depression, and marijuana use. Currently she is prescribed Wellbutrin  XL 150 mg. She reports that her medication is somewhat effective in managing her psychiatric illnesses.   Today she logged in virtually but her camera was off. During exam she was pleasant, cooperative and engaged in conversations. She informed clinical research associate that  she started Wellbutrin  she is more focused and less tired since. Patient informed writer that her anxiety as been increased. She continues to worry about her older son who lives in [place]. Patient notes that he has been estranged from the family. She notes that she has not spoken to him in 6-7 months. She notes that at times he attempts to gas light the family. Patient notes that she continues to be upset about everyday survival. She notes that she continues to want to leave the country to purse her aspirations. Financially she notes that she continues to struggle. 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 She is uncertain if she will be able to take this as she currently don't have stable  housing and living in her car. She notes that she parks in rest stops and [employer]. She notes that she has applied for some rooms as apartments are to expensive. She notes that she travels between [place], [place], and [place] . She notes that she has called the [employer], [employer] in [place], and [employer].   Patient notes that the above worsens her anxiety and depression. Today provider conducted a GAD-7 and patient scored a 6, at her last visit she scored an 8.  Provider also conducted a PHQ-9 and she scored a 13, at her last visit she scored a 13.  She endorses adequate sleep and poor appetite. Today she denies SI/HI/AVH, mania, paranoia.  Patient reports that she has left-sided hip pain.  She quantifies her pain as 5 out of 10.   Today patient agreeable to starting hydroxyzine  10 mg three times daily as needed.  She will continue Wellbutrin  as prescribed.  Potential side effects of medication and risks vs benefits of treatment vs non-treatment were explained and discussed. All questions were answered. No other concerns at this time.  He Visit Diagnosis:    ICD-10-CM   1. Anxiety state  F41.1 hydrOXYzine  (ATARAX ) 10 MG tablet    2. Mild depression  F32.A buPROPion  (WELLBUTRIN  XL) 150 MG 24 hr tablet      Past Psychiatric History: anxiety, depression, and marijuana use  Past Medical History:  Past Medical History:  Diagnosis Date   CHF (congestive heart failure) (HCC)    Chronic constipation 06/29/2019   Diabetes mellitus without complication (HCC)  Difficult intubation    Esophageal eosinophilia 08/25/2019   Family history of adverse reaction to anesthesia    Fibroids, intramural    s/p uterine artery embolization   Former cigarette smoker 04/2011   History of Resolved Nonischemic Congestive Cardiomyopathy (HCC) 04/2011   Echo March 2013: EF 35% with mild/moderate diastolic dysfunction -> Follow-Up Echo March 2014 EF 55% with G1 DD.    Hypertension    Hypertensive heart disease    Difficult control hypertension   Hypothyroidism    Mild concentric left ventricular hypertrophy (LVH) 10/08/2019   Moderate mitral valve regurgitation 10/08/2019   Echo done June 2021; Dr. Anner   Neuromuscular disorder [facility]    diabetic neuropathy   OSA (obstructive sleep apnea)    Sleep apnea    doesn't use cpap regularly   Thyroid  disease    post thyroidectomy   Thyroid  mass of unclear etiology 11/27/2021   Status post thyroidectomy.      Past Surgical History:  Procedure Laterality Date   ABLATION     uterus   COLONOSCOPY     2017 -  normal: in [place], [place]    DIAGNOSTIC LAPAROSCOPY     for fibroids   DILATION AND CURETTAGE OF UTERUS     HYSTERECTOMY ABDOMINAL WITH SALPINGECTOMY Bilateral 03/04/2021   Procedure: HYSTERECTOMY ABDOMINAL WITH BILATERAL SALPINGECTOMY;  Surgeon: Lorence Ozell CROME, MD;  Location: [facility] OR;  Service: Gynecology;  Laterality: Bilateral;  TAP BLOCK   LEFT HEART CATH AND CORONARY ANGIOGRAPHY  05/19/2011   ([facility]): Normal coronary arteries   THYROIDECTOMY     TRANSTHORACIC ECHOCARDIOGRAM  07/18/2011   [facility]: EF 35% with mild to moderate DD. -->   TRANSTHORACIC ECHOCARDIOGRAM     [facility]: EF 55%, G1 DD.  LVIDd 4.3; July 12, 2015: EF 55 to 60%.  Normal valves   TUBAL LIGATION     UPPER GASTROINTESTINAL ENDOSCOPY  07/06/2019    Family Psychiatric History:  Younger sister substance use, PSTD   Family History:  Family History  Problem Relation Age of Onset   Congestive Heart Failure Mother    Breast cancer Neg Hx    Colon cancer Neg Hx    Esophageal cancer Neg Hx    Rectal cancer Neg Hx    Stomach cancer Neg Hx    Inflammatory bowel disease Neg Hx    Liver disease Neg Hx    Pancreatic cancer Neg Hx     Social History:  Social History   Socioeconomic History   Marital status: Single    Spouse name: Not on file   Number of children: 2    Years of education: Not on file   Highest education level: Not on file  Occupational History   Not on file  Tobacco Use   Smoking status: Former    Types: Cigars    Quit date: 03/2019    Years since quitting: 5.1   Smokeless tobacco: Never   Tobacco comments:    smokes occasional cigar  Vaping Use   Vaping status: Every Day   Devices: hemp  Substance and Sexual Activity   Alcohol use: Yes    Comment: social   Drug use: Yes    Types: Marijuana   Sexual activity: Not Currently  Other Topics Concern   Not on file  Social History Narrative   Not on file   Social Drivers of Health   Tobacco Use: Medium Risk (03/02/2024)   Patient History    Smoking  Tobacco Use: Former    Smokeless Tobacco Use: Never    Passive Exposure: Not on file  Financial Resource Strain: High Risk (01/12/2023)   Overall Financial Resource Strain (CARDIA)    Difficulty of Paying Living Expenses: Very hard  Food Insecurity: Not on file  Transportation Needs: Not on file  Physical Activity: Not on file  Stress: Not on file  Social Connections: Not on file  Depression (PHQ2-9): High Risk (05/17/2024)   Depression (PHQ2-9)    PHQ-2 Score: 13  Alcohol Screen: Not on file  Housing: High Risk (01/12/2023)   Housing    Last Housing Risk Score: 2  Utilities: Not on file  Health Literacy: Not on file    Allergies: Allergies[1]  Metabolic Disorder Labs: Lab Results  Component Value Date   HGBA1C 7.9 (A) 11/05/2022   MPG 309 11/15/2020   No results found for: PROLACTIN Lab Results  Component Value Date   CHOL 179 11/05/2022   TRIG 165 (H) 11/05/2022   HDL 53 11/05/2022   CHOLHDL 3.4 11/05/2022   LDLCALC 97 11/05/2022   Lab Results  Component Value Date   TSH 6.230 (H) 11/05/2022   TSH 1.350 05/06/2022    Therapeutic Level Labs: No results found for: LITHIUM No results found for: VALPROATE No results found for: CBMZ  Current Medications: Current Outpatient Medications   Medication Sig Dispense Refill   hydrOXYzine  (ATARAX ) 10 MG tablet Take 1 tablet (10 mg total) by mouth 3 (three) times daily as needed. 90 tablet 3   acyclovir  (ZOVIRAX ) 400 MG tablet Take 1 tablet (400 mg total) by mouth 5 (five) times daily. 60 tablet 1   amLODipine  (NORVASC ) 10 MG tablet Take 1 tablet (10 mg total) by mouth daily. 90 tablet 1   buPROPion  (WELLBUTRIN  XL) 150 MG 24 hr tablet Take 1 tablet (150 mg total) by mouth every morning. 30 tablet 3   carvedilol  (COREG ) 25 MG tablet Take 1 tablet (25 mg total) by mouth 2 (two) times daily with a meal. 180 tablet 3   dapagliflozin  propanediol (FARXIGA ) 10 MG TABS tablet Take 1 tablet (10 mg total) by mouth daily before breakfast. 30 tablet 6   Dulaglutide  (TRULICITY ) 1.5 MG/0.5ML SOAJ Inject 1.5 mg into the skin once a week. Please schedule PCP follow-up with Dr. Newlin 6 mL 3   furosemide  (LASIX ) 20 MG tablet Take 1 tablet (20 mg total) by mouth 2 (two) times daily as needed. 60 tablet 2   glipiZIDE  (GLUCOTROL ) 5 MG tablet Take 1 tablet (5 mg total) by mouth 2 (two) times daily before a meal. 60 tablet 3   losartan  (COZAAR ) 25 MG tablet Take 1 tablet (25 mg total) by mouth daily. 90 tablet 2   Misc. Devices MISC CPAP machine. AutoPAP 8-18cm. Extra Small size [employer] Full Face Evora Full mask and heated humidification. Dx- Obstructive sleep apnea 1 each 0   SYNTHROID  150 MCG tablet Take 1 tablet (150 mcg total) by mouth daily. 90 tablet 3   TRUEplus Lancets 28G MISC Use as directed 100 each 4   Vitamin D , Ergocalciferol , (DRISDOL ) 1.25 MG (50000 UNIT) CAPS capsule Take 1 capsule (50,000 Units total) by mouth every 7 (seven) days. 12 capsule 4   No current facility-administered medications for this visit.     Musculoskeletal: Strength & Muscle Tone: Telehealth Visit. Camera off Gait & Station: Telehealth Visit. Camera of Patient leans: N/A  Psychiatric Specialty Exam: Review of Systems  There were no vitals taken for this  visit.There is no height or weight on file to calculate BMI.  General Appearance: Well Groomed  Eye Contact:  Good  Speech:  Clear and Coherent and Normal Rate  Volume:  Normal  Mood:  Depressed  Affect:  Appropriate and Congruent  Thought Process:  Coherent, Goal Directed, and Linear  Orientation:  Full (Time, Place, and Person)  Thought Content: WDL and Logical   Suicidal Thoughts:  No  Homicidal Thoughts:  No  Memory:  Immediate;   Good Recent;   Good Remote;   Good  Judgement:  Good  Insight:  Good  Psychomotor Activity:  Normal  Concentration:  Concentration: Good and Attention Span: Good  Recall:  Good  Fund of Knowledge: Good  Language: Good  Akathisia:  No  Handed:  Right  AIMS (if indicated): not done  Assets:  Communication Skills Desire for Improvement Financial Resources/Insurance Leisure Time Physical Health Social Support Transportation  ADL's:  Intact  Cognition: WNL  Sleep:  Good   Screenings: GAD-7    Flowsheet Row Video Visit from 05/17/2024 in [facility] Office Visit from 03/02/2024 in [facility] Office Visit from 10/19/2023 in [facility] [place] [facility] [place]. [facility] Office Visit from 11/05/2022 in [facility] [place] [facility] Of [place] [facility]. [facility] Office Visit from 12/10/2021 in [facility] [place] - [facility] Dept Of [place] [facility]. [facility]  Total GAD-7 Score 6 8 5 4  0   PHQ2-9    Flowsheet Row Video Visit from 05/17/2024 in [facility] Office Visit from 03/02/2024 in [facility] Office Visit from 10/19/2023 in [facility] [place] - A Dept Of [place]. [facility] Office Visit from 11/05/2022 in [facility] [place] - A Dept Of [place] [facility]. [facility] Office Visit from 12/10/2021 in [facility] [place] - A Dept Of [place] [facility].  [facility]  PHQ-2 Total Score 4 3 2 1  0  PHQ-9 Total Score 13 13 9 3  --   Flowsheet Row Office Visit from 03/02/2024 in [facility] Admission (Discharged) from 03/04/2021 in [facility]  SURGICAL Pre-Admission Testing 60 from 02/26/2021 in [facility] PREADMISSION TESTING  C-SSRS RISK CATEGORY Error: Q7 should not be populated when Q6 is No No Risk No Risk     Assessment and Plan: Patient continues to face homelessness, have anxiety, and depression. Today she is agreeable to starting hydroxyzine  10 mg 3 times daily to help manage anxiety.  She will continue Wellbutrin  as prescribed.  1. Anxiety state (Primary)  Start- hydrOXYzine  (ATARAX ) 10 MG tablet; Take 1 tablet (10 mg total) by mouth 3 (three) times daily as needed.  Dispense: 90 tablet; Refill: 3  2. Mild depression  Continue- buPROPion  (WELLBUTRIN  XL) 150 MG 24 hr tablet; Take 1 tablet (150 mg total) by mouth every morning.  Dispense: 30 tablet; Refill: 3   Collaboration of Care: Collaboration of Care: Other provider involved in patient's care AEB PCP  Patient/Guardian was advised Release of Information must be obtained prior to any record release in order to collaborate their care with an outside provider. Patient/Guardian was advised if they have not already done so to contact the registration department to sign all necessary forms in order for us  to release information regarding their care.   Consent:  Patient/Guardian gives verbal consent for treatment and assignment of benefits for services provided during this visit. Patient/Guardian expressed understanding and agreed to proceed.    Zane FORBES Bach, NP 05/17/2024, 2:02 PM     [1]  Allergies Allergen Reactions   Statins Other (See Comments)    Myopathy   Chlorhexidine      Itching,redness   Lisinopril Cough   Oxycodone  Itching

## 2024-06-06 ENCOUNTER — Ambulatory Visit: Admitting: Family Medicine

## 2024-07-19 ENCOUNTER — Telehealth (HOSPITAL_COMMUNITY): Admitting: Psychiatry
